# Patient Record
Sex: Female | Born: 1940
Health system: Southern US, Community
[De-identification: ages and names within clinical notes are randomized; demographics above are authoritative.]

## PROBLEM LIST (undated history)

## (undated) DIAGNOSIS — K5792 Diverticulitis of intestine, part unspecified, without perforation or abscess without bleeding: Secondary | ICD-10-CM

## (undated) DIAGNOSIS — Z9889 Other specified postprocedural states: Secondary | ICD-10-CM

## (undated) DIAGNOSIS — M4316 Spondylolisthesis, lumbar region: Secondary | ICD-10-CM

## (undated) DIAGNOSIS — E785 Hyperlipidemia, unspecified: Secondary | ICD-10-CM

## (undated) DIAGNOSIS — K219 Gastro-esophageal reflux disease without esophagitis: Secondary | ICD-10-CM

## (undated) DIAGNOSIS — I639 Cerebral infarction, unspecified: Secondary | ICD-10-CM

## (undated) DIAGNOSIS — I480 Paroxysmal atrial fibrillation: Secondary | ICD-10-CM

## (undated) DIAGNOSIS — K589 Irritable bowel syndrome without diarrhea: Secondary | ICD-10-CM

## (undated) DIAGNOSIS — M719 Bursopathy, unspecified: Secondary | ICD-10-CM

## (undated) DIAGNOSIS — M47816 Spondylosis without myelopathy or radiculopathy, lumbar region: Secondary | ICD-10-CM

## (undated) DIAGNOSIS — I10 Essential (primary) hypertension: Secondary | ICD-10-CM

## (undated) HISTORY — PX: SHOULDER SURGERY: SHX246

## (undated) HISTORY — DX: Other specified postprocedural states: Z98.890

## (undated) HISTORY — PX: HERNIA REPAIR: SHX51

## (undated) HISTORY — PX: APPENDECTOMY: SHX54

## (undated) HISTORY — PX: BREAST CYST ASPIRATION: SHX578

## (undated) HISTORY — DX: Hyperlipidemia, unspecified: E78.5

## (undated) HISTORY — DX: Cerebral infarction, unspecified: I63.9

## (undated) HISTORY — PX: ABDOMINAL HYSTERECTOMY: SHX81

## (undated) HISTORY — DX: Bursopathy, unspecified: M71.9

## (undated) HISTORY — PX: CHOLECYSTECTOMY: SHX55

## (undated) HISTORY — PX: LUMBAR SPINE SURGERY: SHX701

## (undated) HISTORY — DX: Essential (primary) hypertension: I10

## (undated) HISTORY — DX: Gastro-esophageal reflux disease without esophagitis: K21.9

## (undated) HISTORY — DX: Irritable bowel syndrome without diarrhea: K58.9

## (undated) HISTORY — DX: Paroxysmal atrial fibrillation: I48.0

## (undated) HISTORY — DX: Diverticulitis of intestine, part unspecified, without perforation or abscess without bleeding: K57.92

## (undated) HISTORY — PX: BILATERAL SALPINGOOPHORECTOMY: SHX1223

---

## 1997-12-19 ENCOUNTER — Inpatient Hospital Stay (HOSPITAL_COMMUNITY): Admission: EM | Admit: 1997-12-19 | Discharge: 1997-12-23 | Payer: Self-pay | Admitting: Emergency Medicine

## 1998-10-29 ENCOUNTER — Ambulatory Visit (HOSPITAL_COMMUNITY): Admission: RE | Admit: 1998-10-29 | Discharge: 1998-10-29 | Payer: Self-pay | Admitting: Orthopaedic Surgery

## 1998-11-12 ENCOUNTER — Ambulatory Visit (HOSPITAL_COMMUNITY): Admission: RE | Admit: 1998-11-12 | Discharge: 1998-11-12 | Payer: Self-pay | Admitting: Orthopaedic Surgery

## 1998-11-27 ENCOUNTER — Ambulatory Visit (HOSPITAL_COMMUNITY): Admission: RE | Admit: 1998-11-27 | Discharge: 1998-11-27 | Payer: Self-pay | Admitting: Orthopaedic Surgery

## 2000-02-10 ENCOUNTER — Ambulatory Visit (HOSPITAL_COMMUNITY): Admission: RE | Admit: 2000-02-10 | Discharge: 2000-02-10 | Payer: Self-pay | Admitting: Orthopaedic Surgery

## 2000-02-12 ENCOUNTER — Ambulatory Visit (HOSPITAL_COMMUNITY): Admission: RE | Admit: 2000-02-12 | Discharge: 2000-02-12 | Payer: Self-pay | Admitting: Orthopaedic Surgery

## 2000-02-28 ENCOUNTER — Inpatient Hospital Stay (HOSPITAL_COMMUNITY): Admission: RE | Admit: 2000-02-28 | Discharge: 2000-02-29 | Payer: Self-pay | Admitting: Orthopaedic Surgery

## 2000-04-14 ENCOUNTER — Ambulatory Visit (HOSPITAL_COMMUNITY): Admission: RE | Admit: 2000-04-14 | Discharge: 2000-04-14 | Payer: Self-pay | Admitting: Orthopaedic Surgery

## 2000-06-12 ENCOUNTER — Encounter (HOSPITAL_COMMUNITY): Admission: RE | Admit: 2000-06-12 | Discharge: 2000-07-12 | Payer: Self-pay | Admitting: Orthopaedic Surgery

## 2003-10-21 ENCOUNTER — Ambulatory Visit (HOSPITAL_COMMUNITY): Admission: RE | Admit: 2003-10-21 | Discharge: 2003-10-21 | Payer: Self-pay | Admitting: Family Medicine

## 2004-02-04 ENCOUNTER — Ambulatory Visit (HOSPITAL_COMMUNITY): Admission: RE | Admit: 2004-02-04 | Discharge: 2004-02-04 | Payer: Self-pay | Admitting: Family Medicine

## 2005-08-15 ENCOUNTER — Ambulatory Visit: Payer: Self-pay | Admitting: Internal Medicine

## 2005-08-20 ENCOUNTER — Ambulatory Visit: Payer: Self-pay | Admitting: Internal Medicine

## 2008-04-18 ENCOUNTER — Encounter: Payer: Self-pay | Admitting: Cardiology

## 2008-04-19 ENCOUNTER — Encounter: Payer: Self-pay | Admitting: Cardiology

## 2008-05-01 ENCOUNTER — Ambulatory Visit: Payer: Self-pay | Admitting: Cardiology

## 2008-05-01 ENCOUNTER — Encounter: Payer: Self-pay | Admitting: Cardiology

## 2008-09-11 ENCOUNTER — Encounter: Payer: Self-pay | Admitting: Cardiology

## 2008-09-24 ENCOUNTER — Encounter: Payer: Self-pay | Admitting: Cardiology

## 2008-09-25 ENCOUNTER — Ambulatory Visit: Payer: Self-pay | Admitting: Cardiology

## 2008-09-26 ENCOUNTER — Ambulatory Visit: Payer: Self-pay | Admitting: Cardiology

## 2008-09-26 DIAGNOSIS — M47816 Spondylosis without myelopathy or radiculopathy, lumbar region: Secondary | ICD-10-CM

## 2008-09-26 DIAGNOSIS — E785 Hyperlipidemia, unspecified: Secondary | ICD-10-CM

## 2008-09-26 DIAGNOSIS — I10 Essential (primary) hypertension: Secondary | ICD-10-CM

## 2010-01-24 ENCOUNTER — Encounter: Payer: Self-pay | Admitting: Neurology

## 2010-04-27 ENCOUNTER — Ambulatory Visit (INDEPENDENT_AMBULATORY_CARE_PROVIDER_SITE_OTHER): Payer: PRIVATE HEALTH INSURANCE | Admitting: Internal Medicine

## 2010-04-27 DIAGNOSIS — K5730 Diverticulosis of large intestine without perforation or abscess without bleeding: Secondary | ICD-10-CM

## 2010-04-27 DIAGNOSIS — R109 Unspecified abdominal pain: Secondary | ICD-10-CM

## 2010-05-14 NOTE — Consult Note (Signed)
NAMEVERNADINE, Roberts             ACCOUNT NO.:  [id number]  MEDICAL RECORD NO.:  [id number]          PATIENT TYPE:  LOCATION:                                 FACILITY:  PHYSICIAN:  Lionel December, M.D.    DATE OF BIRTH:  July 19, 1940  DATE OF CONSULTATION: DATE OF DISCHARGE:                                CONSULTATION   REASON FOR CONSULTATION:  Abdominal pain/colonoscopy.  HISTORY OF PRESENT ILLNESS:  Tamara Roberts is a 70 year old female referred to our office for right upper quadrant pain and right and left lower abdominal tenderness.  She does have a history of diverticulitis.  She reports that she recently underwent a renal ultrasound and it was normal.  She also underwent a cystoscope in [place], about 1-1/2 weeks ago and it was normal.  She does say she has a history of diverticulitis. She underwent a CT of the abdomen and pelvis with contrast on March 10, 2010, which revealed extensive diverticulosis of the sigmoid portion of the colon.  There is a single tiny area of haziness around the descending colon at the left anterior superior iliac crest which they suspected scarring from previous diverticulosis rather than from acute inflammation.  The renal ultrasound was done on April 07, 2010, and it was normal.  Her last colonoscopy was on March 08, 2004, by Dr. Cleotis Nipper, which revealed the external anus no abnormalities.  The digital exam of the rectum revealed no rectal masses, no blood on the glove.  There were no polyps or other neoplasm seen.  No inflammation seen.  The patient did have diverticulosis of her mid and distal sigmoid colon with some mild inflammatory, grossly in the distal sigmoid colon. The patient was noted to have internal hemorrhoids.  She says her last flare of diverticulitis was in March.  She was treated with Levaquin at that time.  She says she has had 3-4 bouts of diverticulitis in the past.  She does state that sometimes her abdomen will swell.  When  she does have this abdominal pain on the right and left lower, it is severe. She usually has a bowel movement one every other day.  Her stools are brown in color.  She denies any recent fever.  She is allergic to CODEINE.  SURGERIES:  She had back surgery years ago.  She had a rotator cuff repair, right inguinal hernia repair.  She had a hysterectomy in 1985, with a right oophorectomy with an appendectomy done at that time.  She has had a cholecystectomy.  MEDICAL HISTORY:  Hypertension, high cholesterol, allergies, diverticulitis and diverticulosis.  FAMILY HISTORY:  Her mother is deceased from stomach cancer.  Father is deceased from coronary artery disease.  One sister in good health, two brothers, one is deceased from alcoholism, one in good health.  SOCIAL HISTORY:  She is married.  She works at [employer] as a Diplomatic Services operational officer.  She does not smoke, drink, or do drugs.  She has three children in good health, one daughter at age 39, has CHF developing after childbirth.  HOME MEDICATIONS: 1. Ziac 2.5 mg a day. 2. Lipitor 20 mg a day. 3. Protonix  40 mg b.i.d.4. Hyoscyamine 0.125 mg 1-2 a day as needed. 5. Antivert 12.5 as needed. 6. Zofran 4 mg as needed. 7. Zithromax one a day.  OBJECTIVE:  VITAL SIGNS:  Her weight is 179.2, height 5 feet, temp 96.9, blood pressure 108/68, pulse 72. HEENT:  She has natural teeth.  Her oral mucosa is moist.  Conjunctivae is pink.  Sclerae anicteric.  Thyroid is normal.  There is no cervical lymphadenopathy. LUNGS:  Clear. HEART:  Regular rate and rhythm. ABDOMEN:  Soft.  Bowel sounds are positive.  She does have slight tenderness to the right and left lower quadrants, but very slight on deep palpation.  LABORATORY DATA:  Glucose 84, BUN 19, creatinine 1.16, calcium 9.1, phosphorus 4.3, uric acid 5.6, total protein 6.8, albumin 3.9, AST 22, total bilirubin 0.6, ALT 19, GGT 17, triglycerides normal at 154. Sodium 140, potassium 4.0,  chloride 105, iron 78.  TSH is normal at 4.62, hemoglobin is 12, hematocrit 36.8, MCV 91, WBC count 5.1.  ASSESSMENT:  [name] is a 70 year old female presenting with complaints of left and right lower quadrant pain tenderness on deep palpation.  I doubt this is a diverticular flare at this time.  She does however have history of diverticulitis.  Her last colonoscopy was in 2006, by Dr. Cleotis Nipper.  ASSESSMENT:  At present, she does have this abdominal pain, questionable etiology.  No inflammatory process or colonic neoplasm probably needs to be ruled out.  RECOMMENDATIONS:  We will schedule a colonoscopy with Dr. Karilyn Cota in the near future.  I advised the patient if she thinks she is having a flare of diverticulitis, will go to the emergency room, if the pain is severe and please let us know if she is having a flare as we approach her due date for the colonoscopy.  Thank you for allowing [place] to participate in her care.    ______________________________ Dorene Ar, NP   ______________________________ Lionel December, M.D.    TS/MEDQ  D:  04/27/2010  T:  04/28/2010  Job:  [id number]  cc:   Dr. Sherryll Burger  Electronically Signed by Dorene Ar PA on 04/30/2010 10:48:04 AM Electronically Signed by Lionel December M.D. on 05/14/2010 12:13:57 PM

## 2010-05-21 NOTE — Op Note (Signed)
[place]  Patient:    Tamara Roberts, Tamara Roberts                      MRN: [id number] Proc. Date: 02/28/00 Adm. Date:  [id number] Attending:  Jacki Cones                           Operative Report  PREOPERATIVE DIAGNOSIS:  L2-3 herniated nucleus pulposus left, with free fragment and radiculopathy.  POSTOPERATIVE DIAGNOSIS:  L2-3 herniated nucleus pulposus left, with free fragment and radiculopathy.  PROCEDURE:  Left L2 laminotomy, L2-3 microdiskectomy, and removal of free fragment.  SURGEON:  Mark C. Ophelia Charter, M.D.  ANESTHESIA:  GOT.  ESTIMATED BLOOD LOSS:  Minimal.  DESCRIPTION OF PROCEDURE:  After induction of general anesthesia, the patient was placed on the Andrews frame with careful padding and positioning.  The back was prepped with Duraprep and the area was covered with towels, Betadine Vi-Drape applied, and laminectomy sheets and drapes applied.  Needle localization and crosstable lateral x-ray taken, confirming that the needle was at L2-3 disk space.  A small incision was made just left of the midline, subcutaneous tissue was sharply dissected with Bovie electrocautery. Subperiosteal dissection on the lamina and placement of Taylor retractor laterally with five-pound weight.  Next, the edges of the lamina were outlined using a cup curette.  Smallest Kerrison was used to perform a laminotomy.  There was ligament hypertrophy and facet overgrowth, and the lateral gutter was cleaned of soft tissue and facet spurring overhang out to the level of the pedicle.  Dura was displaced dorsally, and carefully the dura using a Penfield #4 was gently dissected until the floor was encountered.  There was a sac of tissue, and immediately portions of free fragment were found and suctioned up.  Nerve hook was used to gently deliver multiple pieces of the free fragment into the microscopic field and suctioned up or grasped them with the micropituitaries.   Disk herniation was in a linear split and was just left of the midline, and fragments had herniated both superiorly, which was cephalad and also primarily caudally. Caudal pieces were suctioned up, gently delivered with the ball-tip nerve hook to prevent pushing the fragments farther up or down the canal.  Epstein curette was used to help deliver some of them.  The sac where the fragments had slid down the canal was a thin, fibrous capsule which surrounded the fragments.  This was opened up, and remaining tiny pieces were removed.  By then, a hockey stick could be easily moved 180 degree sweep anterior to the dura and up the foramina with no areas of compression.  A stab X was made with the 15 blade using DErrico, gently retracting the dura, and numerous passes were made with the straight pituitary, removing a small amount of degenerative disk material.  Anterior portion of the disk was intact, and micropituitary was used as well and Epstein curette at the midline to remove one small piece, which was subligamentous in the midline.  Final sweeps were made for any remaining pieces of the free fragment, and none were left.  Nerve was well-decompressed.  Palpation on all sides of the nerve root was performed with no areas of compression and after irrigation with saline solution in the disk and operative field, fascia was closed with 0 Vicryl, 2-0 Vicryl in the subcutaneous tissue, and Marcaine infiltration of the skin and skin staple closure.  Instrument count and needle count were correct. DD:  02/28/00 TD:  02/29/00 Job: [id number]

## 2010-06-03 ENCOUNTER — Encounter (HOSPITAL_BASED_OUTPATIENT_CLINIC_OR_DEPARTMENT_OTHER): Payer: PRIVATE HEALTH INSURANCE | Admitting: Internal Medicine

## 2010-06-03 ENCOUNTER — Ambulatory Visit (HOSPITAL_COMMUNITY)
Admission: RE | Admit: 2010-06-03 | Discharge: 2010-06-03 | Disposition: A | Discharge status: Home / Self Care | Payer: PRIVATE HEALTH INSURANCE | Source: Ambulatory Visit | Attending: Internal Medicine | Admitting: Internal Medicine

## 2010-06-03 DIAGNOSIS — K5730 Diverticulosis of large intestine without perforation or abscess without bleeding: Secondary | ICD-10-CM | POA: Insufficient documentation

## 2010-06-03 DIAGNOSIS — R109 Unspecified abdominal pain: Secondary | ICD-10-CM

## 2010-06-03 DIAGNOSIS — R198 Other specified symptoms and signs involving the digestive system and abdomen: Secondary | ICD-10-CM

## 2010-06-03 DIAGNOSIS — I10 Essential (primary) hypertension: Secondary | ICD-10-CM | POA: Insufficient documentation

## 2010-06-03 DIAGNOSIS — K644 Residual hemorrhoidal skin tags: Secondary | ICD-10-CM | POA: Insufficient documentation

## 2010-06-03 DIAGNOSIS — E785 Hyperlipidemia, unspecified: Secondary | ICD-10-CM | POA: Insufficient documentation

## 2010-06-03 DIAGNOSIS — K5732 Diverticulitis of large intestine without perforation or abscess without bleeding: Secondary | ICD-10-CM

## 2010-06-22 NOTE — Op Note (Signed)
  NAMEGEORGEANNE, Tamara Roberts             ACCOUNT NO.:  [id number]  MEDICAL RECORD NO.:  [id number]           PATIENT TYPE:  O  LOCATION:  [facility]                          FACILITY:  [facility]  PHYSICIAN:  Lionel December, M.D.    DATE OF BIRTH:  [birth year]  DATE OF PROCEDURE:  06/03/2010 DATE OF DISCHARGE:                              OPERATIVE REPORT   PROCEDURE:  Colonoscopy.  INDICATION:  [name] is a 70 year old Caucasian female who was treated for diverticulitis in March of this year and she still has intermittent pain either on the left or the right side.  She also gives history of irregular bowel movements with diarrhea and/or constipation.  Her last colonoscopy was over 7 years ago.  She is undergoing diagnostic colonoscopy.  Procedure risks were reviewed with the patient.  Informed consent was obtained.  MEDICATIONS FOR CONSCIOUS SEDATION:  Demerol 50 mg IV and Versed 5 mg IV.  FINDINGS:  Procedure performed in endoscopy suite.  The patient's vital signs and O2 saturations were monitored during the procedure and remained stable.  The patient was placed in left lateral recumbent position and rectal examination was performed.  No abnormality was noted on external or digital exam.  Pentax videoscope was placed through rectum and advanced under vision into sigmoid colon and beyond. Preparation was excellent.  She had moderate number of diverticula at sigmoid colon.  None of these had any peristomal edema or erythema. Scope was advanced into cecum which was identified by appendiceal stump and ileocecal valve.  Pictures were taken for the record.  As the scope was withdrawn, colonic mucosa was carefully examined and no polyps or tumor masses were noted.  Rectal mucosa was normal.  Scope was retroflexed to examine anorectal junction and small hemorrhoids were noted below the dentate line along with a thickened anoderm.  Endoscope was withdrawn.  Withdrawal time was 11 minutes.  The patient  tolerated the procedure well.  FINAL DIAGNOSES: 1. Sigmoid diverticulosis (moderate number). 2. Small external hemorrhoids. 3. No evidence of polyps, colitis, or tumor. 4. Suspect her ongoing symptoms may be due to irritable bowel     syndrome.  RECOMMENDATIONS: 1. High-fiber diet plus fiber supplement 3-4 g daily. 2. Levsin SL t.i.d. p.r.n., prescription given for 60 with 3 refills.     If her symptoms persist, she will return for office visit.  She     will call for office visit.          ______________________________ Lionel December, M.D.     NR/MEDQ  D:  06/03/2010  T:  06/04/2010  Job:  [id number]  cc:   Dr. Sherryll Burger  Electronically Signed by Lionel December M.D. on 06/22/2010 10:11:16 AM

## 2011-07-15 ENCOUNTER — Encounter: Payer: Self-pay | Admitting: *Deleted

## 2012-08-13 ENCOUNTER — Ambulatory Visit: Payer: Self-pay | Admitting: Adult Health

## 2014-03-05 ENCOUNTER — Other Ambulatory Visit: Payer: Self-pay

## 2014-03-05 DIAGNOSIS — Z1231 Encounter for screening mammogram for malignant neoplasm of breast: Secondary | ICD-10-CM

## 2014-03-11 ENCOUNTER — Encounter (INDEPENDENT_AMBULATORY_CARE_PROVIDER_SITE_OTHER): Payer: Self-pay

## 2014-03-11 ENCOUNTER — Ambulatory Visit
Admission: RE | Admit: 2014-03-11 | Discharge: 2014-03-11 | Disposition: A | Discharge status: Home / Self Care | Payer: Medicare Other | Source: Ambulatory Visit

## 2014-03-11 DIAGNOSIS — Z1231 Encounter for screening mammogram for malignant neoplasm of breast: Secondary | ICD-10-CM

## 2014-10-20 ENCOUNTER — Other Ambulatory Visit (HOSPITAL_COMMUNITY): Payer: Self-pay | Admitting: Nephrology

## 2014-10-20 DIAGNOSIS — N289 Disorder of kidney and ureter, unspecified: Secondary | ICD-10-CM

## 2014-11-05 ENCOUNTER — Ambulatory Visit (HOSPITAL_COMMUNITY)
Admission: RE | Admit: 2014-11-05 | Discharge: 2014-11-05 | Disposition: A | Discharge status: Home / Self Care | Payer: Medicare Other | Source: Ambulatory Visit | Attending: Nephrology | Admitting: Nephrology

## 2014-11-05 DIAGNOSIS — N289 Disorder of kidney and ureter, unspecified: Secondary | ICD-10-CM | POA: Diagnosis not present

## 2014-11-12 ENCOUNTER — Other Ambulatory Visit (HOSPITAL_COMMUNITY): Payer: Medicare Other

## 2015-01-21 DIAGNOSIS — M1990 Unspecified osteoarthritis, unspecified site: Secondary | ICD-10-CM | POA: Diagnosis not present

## 2015-01-21 DIAGNOSIS — Z789 Other specified health status: Secondary | ICD-10-CM | POA: Diagnosis not present

## 2015-01-21 DIAGNOSIS — Z6832 Body mass index (BMI) 32.0-32.9, adult: Secondary | ICD-10-CM | POA: Diagnosis not present

## 2015-01-21 DIAGNOSIS — E7800 Pure hypercholesterolemia, unspecified: Secondary | ICD-10-CM | POA: Diagnosis not present

## 2015-01-22 DIAGNOSIS — G4700 Insomnia, unspecified: Secondary | ICD-10-CM | POA: Diagnosis not present

## 2015-01-22 DIAGNOSIS — M25512 Pain in left shoulder: Secondary | ICD-10-CM | POA: Diagnosis not present

## 2015-01-22 DIAGNOSIS — Z418 Encounter for other procedures for purposes other than remedying health state: Secondary | ICD-10-CM | POA: Diagnosis not present

## 2015-02-26 DIAGNOSIS — J0100 Acute maxillary sinusitis, unspecified: Secondary | ICD-10-CM | POA: Diagnosis not present

## 2015-03-02 DIAGNOSIS — J111 Influenza due to unidentified influenza virus with other respiratory manifestations: Secondary | ICD-10-CM | POA: Diagnosis not present

## 2015-04-27 DIAGNOSIS — M159 Polyosteoarthritis, unspecified: Secondary | ICD-10-CM | POA: Diagnosis not present

## 2015-04-27 DIAGNOSIS — I639 Cerebral infarction, unspecified: Secondary | ICD-10-CM | POA: Diagnosis not present

## 2015-04-27 DIAGNOSIS — I10 Essential (primary) hypertension: Secondary | ICD-10-CM | POA: Diagnosis not present

## 2015-04-27 DIAGNOSIS — E7800 Pure hypercholesterolemia, unspecified: Secondary | ICD-10-CM | POA: Diagnosis not present

## 2015-06-22 DIAGNOSIS — J069 Acute upper respiratory infection, unspecified: Secondary | ICD-10-CM | POA: Diagnosis not present

## 2015-06-22 DIAGNOSIS — I10 Essential (primary) hypertension: Secondary | ICD-10-CM | POA: Diagnosis not present

## 2015-06-22 DIAGNOSIS — I639 Cerebral infarction, unspecified: Secondary | ICD-10-CM | POA: Diagnosis not present

## 2015-06-29 DIAGNOSIS — I639 Cerebral infarction, unspecified: Secondary | ICD-10-CM | POA: Diagnosis not present

## 2015-06-29 DIAGNOSIS — I10 Essential (primary) hypertension: Secondary | ICD-10-CM | POA: Diagnosis not present

## 2015-06-29 DIAGNOSIS — E7800 Pure hypercholesterolemia, unspecified: Secondary | ICD-10-CM | POA: Diagnosis not present

## 2015-06-29 DIAGNOSIS — M159 Polyosteoarthritis, unspecified: Secondary | ICD-10-CM | POA: Diagnosis not present

## 2015-07-01 DIAGNOSIS — M5416 Radiculopathy, lumbar region: Secondary | ICD-10-CM | POA: Diagnosis not present

## 2015-07-23 DIAGNOSIS — I639 Cerebral infarction, unspecified: Secondary | ICD-10-CM | POA: Diagnosis not present

## 2015-07-23 DIAGNOSIS — M159 Polyosteoarthritis, unspecified: Secondary | ICD-10-CM | POA: Diagnosis not present

## 2015-07-23 DIAGNOSIS — E7800 Pure hypercholesterolemia, unspecified: Secondary | ICD-10-CM | POA: Diagnosis not present

## 2015-07-23 DIAGNOSIS — I10 Essential (primary) hypertension: Secondary | ICD-10-CM | POA: Diagnosis not present

## 2015-07-27 DIAGNOSIS — Z299 Encounter for prophylactic measures, unspecified: Secondary | ICD-10-CM | POA: Diagnosis not present

## 2015-07-27 DIAGNOSIS — Z7189 Other specified counseling: Secondary | ICD-10-CM | POA: Diagnosis not present

## 2015-07-27 DIAGNOSIS — Z1211 Encounter for screening for malignant neoplasm of colon: Secondary | ICD-10-CM | POA: Diagnosis not present

## 2015-07-27 DIAGNOSIS — Z0000 Encounter for general adult medical examination without abnormal findings: Secondary | ICD-10-CM | POA: Diagnosis not present

## 2015-07-27 DIAGNOSIS — R5383 Other fatigue: Secondary | ICD-10-CM | POA: Diagnosis not present

## 2015-07-27 DIAGNOSIS — Z79899 Other long term (current) drug therapy: Secondary | ICD-10-CM | POA: Diagnosis not present

## 2015-07-27 DIAGNOSIS — Z6830 Body mass index (BMI) 30.0-30.9, adult: Secondary | ICD-10-CM | POA: Diagnosis not present

## 2015-07-27 DIAGNOSIS — Z1389 Encounter for screening for other disorder: Secondary | ICD-10-CM | POA: Diagnosis not present

## 2015-07-27 DIAGNOSIS — E7800 Pure hypercholesterolemia, unspecified: Secondary | ICD-10-CM | POA: Diagnosis not present

## 2015-08-14 DIAGNOSIS — Z299 Encounter for prophylactic measures, unspecified: Secondary | ICD-10-CM | POA: Diagnosis not present

## 2015-08-14 DIAGNOSIS — B029 Zoster without complications: Secondary | ICD-10-CM | POA: Diagnosis not present

## 2015-09-10 DIAGNOSIS — I639 Cerebral infarction, unspecified: Secondary | ICD-10-CM | POA: Diagnosis not present

## 2015-09-10 DIAGNOSIS — M25512 Pain in left shoulder: Secondary | ICD-10-CM | POA: Diagnosis not present

## 2015-09-10 DIAGNOSIS — F329 Major depressive disorder, single episode, unspecified: Secondary | ICD-10-CM | POA: Diagnosis not present

## 2015-09-10 DIAGNOSIS — E7800 Pure hypercholesterolemia, unspecified: Secondary | ICD-10-CM | POA: Diagnosis not present

## 2015-09-16 ENCOUNTER — Other Ambulatory Visit: Payer: Self-pay | Admitting: Internal Medicine

## 2015-09-16 DIAGNOSIS — Z1231 Encounter for screening mammogram for malignant neoplasm of breast: Secondary | ICD-10-CM

## 2015-09-30 ENCOUNTER — Ambulatory Visit
Admission: RE | Admit: 2015-09-30 | Discharge: 2015-09-30 | Disposition: A | Discharge status: Home / Self Care | Payer: PPO | Source: Ambulatory Visit | Attending: Internal Medicine | Admitting: Internal Medicine

## 2015-09-30 ENCOUNTER — Encounter: Payer: Self-pay | Admitting: Radiology

## 2015-09-30 DIAGNOSIS — Z1231 Encounter for screening mammogram for malignant neoplasm of breast: Secondary | ICD-10-CM

## 2015-10-22 DIAGNOSIS — R51 Headache: Secondary | ICD-10-CM | POA: Diagnosis not present

## 2015-10-22 DIAGNOSIS — Z713 Dietary counseling and surveillance: Secondary | ICD-10-CM | POA: Diagnosis not present

## 2015-10-22 DIAGNOSIS — Z299 Encounter for prophylactic measures, unspecified: Secondary | ICD-10-CM | POA: Diagnosis not present

## 2015-10-22 DIAGNOSIS — G4700 Insomnia, unspecified: Secondary | ICD-10-CM | POA: Diagnosis not present

## 2015-10-22 DIAGNOSIS — Z6831 Body mass index (BMI) 31.0-31.9, adult: Secondary | ICD-10-CM | POA: Diagnosis not present

## 2015-11-10 DIAGNOSIS — M5416 Radiculopathy, lumbar region: Secondary | ICD-10-CM | POA: Diagnosis not present

## 2016-01-01 ENCOUNTER — Encounter: Payer: Self-pay | Admitting: Adult Health

## 2016-01-01 ENCOUNTER — Ambulatory Visit (INDEPENDENT_AMBULATORY_CARE_PROVIDER_SITE_OTHER): Payer: PPO | Admitting: Adult Health

## 2016-01-01 VITALS — BP 130/80 | HR 88 | Ht 61.0 in | Wt 167.5 lb

## 2016-01-01 DIAGNOSIS — I10 Essential (primary) hypertension: Secondary | ICD-10-CM | POA: Diagnosis not present

## 2016-01-01 DIAGNOSIS — I639 Cerebral infarction, unspecified: Secondary | ICD-10-CM | POA: Diagnosis not present

## 2016-01-01 DIAGNOSIS — E7800 Pure hypercholesterolemia, unspecified: Secondary | ICD-10-CM | POA: Diagnosis not present

## 2016-01-01 DIAGNOSIS — N764 Abscess of vulva: Secondary | ICD-10-CM | POA: Diagnosis not present

## 2016-01-01 DIAGNOSIS — M159 Polyosteoarthritis, unspecified: Secondary | ICD-10-CM | POA: Diagnosis not present

## 2016-01-01 MED ORDER — CIPROFLOXACIN HCL 500 MG PO TABS: 500.0000 mg | ORAL_TABLET | Freq: Two times a day (BID) | ORAL | 0 refills | Status: DC

## 2016-01-01 NOTE — Patient Instructions (Signed)
Use warm compress 3 x day Take cipro Follow up prn

## 2016-01-01 NOTE — Progress Notes (Signed)
Subjective:     Patient ID: Leeanne Mannanarolyn V Baca, female   DOB: 10/17/1940, 75 y.o.   MRN: [id number]  HPI Eber JonesCarolyn is a 75 year old white female, married in complaining knot in vagina area, that is tender, but it has gone down in size.  PCP Dr Sherryll BurgerShah in [place].  Review of Systems Knot in vagina area Reviewed past medical,surgical, social and family history. Reviewed medications and allergies.     Objective:   Physical Exam BP 130/80 (BP Location: Left Arm, Patient Position: Sitting, Cuff Size: Normal)   Pulse 88   Ht 5\' 1"  (1.549 m)   Wt 167 lb 8 oz (76 kg)   BMI 31.65 kg/m PHQ 2 score 0. Skin warm and dry, has 2 cm tender area left labia that has drained, and seems be resolving.Will give cipro to hasten healing and use warm compresses, do not squeeze.     Assessment:     1. Boil of vulva       Plan:     Rx cipro 500 mg 1 bid x 7 days #14  Use warm compresses 3 x daily Follow up prn

## 2016-01-22 DIAGNOSIS — M2550 Pain in unspecified joint: Secondary | ICD-10-CM | POA: Diagnosis not present

## 2016-01-22 DIAGNOSIS — R42 Dizziness and giddiness: Secondary | ICD-10-CM | POA: Diagnosis not present

## 2016-01-22 DIAGNOSIS — M1990 Unspecified osteoarthritis, unspecified site: Secondary | ICD-10-CM | POA: Diagnosis not present

## 2016-01-26 DIAGNOSIS — M24842 Other specific joint derangements of left hand, not elsewhere classified: Secondary | ICD-10-CM | POA: Diagnosis not present

## 2016-01-26 DIAGNOSIS — M79642 Pain in left hand: Secondary | ICD-10-CM | POA: Diagnosis not present

## 2016-01-26 DIAGNOSIS — M19041 Primary osteoarthritis, right hand: Secondary | ICD-10-CM | POA: Diagnosis not present

## 2016-01-26 DIAGNOSIS — M24841 Other specific joint derangements of right hand, not elsewhere classified: Secondary | ICD-10-CM | POA: Diagnosis not present

## 2016-01-26 DIAGNOSIS — M19042 Primary osteoarthritis, left hand: Secondary | ICD-10-CM | POA: Diagnosis not present

## 2016-02-24 DIAGNOSIS — M545 Low back pain: Secondary | ICD-10-CM | POA: Diagnosis not present

## 2016-02-24 DIAGNOSIS — Z299 Encounter for prophylactic measures, unspecified: Secondary | ICD-10-CM | POA: Diagnosis not present

## 2016-02-24 DIAGNOSIS — I639 Cerebral infarction, unspecified: Secondary | ICD-10-CM | POA: Diagnosis not present

## 2016-02-24 DIAGNOSIS — F329 Major depressive disorder, single episode, unspecified: Secondary | ICD-10-CM | POA: Diagnosis not present

## 2016-02-24 DIAGNOSIS — E7800 Pure hypercholesterolemia, unspecified: Secondary | ICD-10-CM | POA: Diagnosis not present

## 2016-02-24 DIAGNOSIS — Z6830 Body mass index (BMI) 30.0-30.9, adult: Secondary | ICD-10-CM | POA: Diagnosis not present

## 2016-02-24 DIAGNOSIS — Z789 Other specified health status: Secondary | ICD-10-CM | POA: Diagnosis not present

## 2016-02-24 DIAGNOSIS — I10 Essential (primary) hypertension: Secondary | ICD-10-CM | POA: Diagnosis not present

## 2016-02-24 DIAGNOSIS — Z713 Dietary counseling and surveillance: Secondary | ICD-10-CM | POA: Diagnosis not present

## 2016-03-10 DIAGNOSIS — Z713 Dietary counseling and surveillance: Secondary | ICD-10-CM | POA: Diagnosis not present

## 2016-03-10 DIAGNOSIS — Z299 Encounter for prophylactic measures, unspecified: Secondary | ICD-10-CM | POA: Diagnosis not present

## 2016-03-10 DIAGNOSIS — M545 Low back pain: Secondary | ICD-10-CM | POA: Diagnosis not present

## 2016-03-10 DIAGNOSIS — Z6830 Body mass index (BMI) 30.0-30.9, adult: Secondary | ICD-10-CM | POA: Diagnosis not present

## 2016-03-10 DIAGNOSIS — I10 Essential (primary) hypertension: Secondary | ICD-10-CM | POA: Diagnosis not present

## 2016-03-10 DIAGNOSIS — Z789 Other specified health status: Secondary | ICD-10-CM | POA: Diagnosis not present

## 2016-03-10 DIAGNOSIS — F329 Major depressive disorder, single episode, unspecified: Secondary | ICD-10-CM | POA: Diagnosis not present

## 2016-03-10 DIAGNOSIS — E7800 Pure hypercholesterolemia, unspecified: Secondary | ICD-10-CM | POA: Diagnosis not present

## 2016-04-01 ENCOUNTER — Emergency Department (HOSPITAL_COMMUNITY): Payer: PPO

## 2016-04-01 ENCOUNTER — Ambulatory Visit (HOSPITAL_COMMUNITY)
Admission: EM | Disposition: A | Discharge status: Home / Self Care | Payer: Self-pay | Source: Home / Self Care | Attending: Emergency Medicine

## 2016-04-01 ENCOUNTER — Observation Stay (HOSPITAL_COMMUNITY)
Admission: EM | Admit: 2016-04-01 | Discharge: 2016-04-02 | Disposition: A | Discharge status: Home / Self Care | Payer: PPO | Attending: Cardiology | Admitting: Cardiology

## 2016-04-01 ENCOUNTER — Inpatient Hospital Stay (HOSPITAL_COMMUNITY): Payer: PPO

## 2016-04-01 ENCOUNTER — Encounter (HOSPITAL_COMMUNITY): Payer: Self-pay

## 2016-04-01 DIAGNOSIS — E785 Hyperlipidemia, unspecified: Secondary | ICD-10-CM | POA: Diagnosis present

## 2016-04-01 DIAGNOSIS — R079 Chest pain, unspecified: Secondary | ICD-10-CM | POA: Diagnosis not present

## 2016-04-01 DIAGNOSIS — Z833 Family history of diabetes mellitus: Secondary | ICD-10-CM | POA: Insufficient documentation

## 2016-04-01 DIAGNOSIS — M4726 Other spondylosis with radiculopathy, lumbar region: Secondary | ICD-10-CM | POA: Insufficient documentation

## 2016-04-01 DIAGNOSIS — I2510 Atherosclerotic heart disease of native coronary artery without angina pectoris: Secondary | ICD-10-CM | POA: Diagnosis not present

## 2016-04-01 DIAGNOSIS — Z7902 Long term (current) use of antithrombotics/antiplatelets: Secondary | ICD-10-CM | POA: Diagnosis not present

## 2016-04-01 DIAGNOSIS — I200 Unstable angina: Secondary | ICD-10-CM | POA: Diagnosis not present

## 2016-04-01 DIAGNOSIS — I700 Atherosclerosis of aorta: Secondary | ICD-10-CM | POA: Diagnosis not present

## 2016-04-01 DIAGNOSIS — R110 Nausea: Secondary | ICD-10-CM | POA: Diagnosis not present

## 2016-04-01 DIAGNOSIS — E876 Hypokalemia: Secondary | ICD-10-CM

## 2016-04-01 DIAGNOSIS — K589 Irritable bowel syndrome without diarrhea: Secondary | ICD-10-CM | POA: Insufficient documentation

## 2016-04-01 DIAGNOSIS — Z7982 Long term (current) use of aspirin: Secondary | ICD-10-CM | POA: Insufficient documentation

## 2016-04-01 DIAGNOSIS — K219 Gastro-esophageal reflux disease without esophagitis: Secondary | ICD-10-CM | POA: Diagnosis not present

## 2016-04-01 DIAGNOSIS — N289 Disorder of kidney and ureter, unspecified: Secondary | ICD-10-CM

## 2016-04-01 DIAGNOSIS — M1990 Unspecified osteoarthritis, unspecified site: Secondary | ICD-10-CM | POA: Insufficient documentation

## 2016-04-01 DIAGNOSIS — Z8249 Family history of ischemic heart disease and other diseases of the circulatory system: Secondary | ICD-10-CM | POA: Insufficient documentation

## 2016-04-01 DIAGNOSIS — Z8673 Personal history of transient ischemic attack (TIA), and cerebral infarction without residual deficits: Secondary | ICD-10-CM | POA: Diagnosis not present

## 2016-04-01 DIAGNOSIS — I10 Essential (primary) hypertension: Secondary | ICD-10-CM | POA: Diagnosis not present

## 2016-04-01 DIAGNOSIS — Z7951 Long term (current) use of inhaled steroids: Secondary | ICD-10-CM | POA: Diagnosis not present

## 2016-04-01 DIAGNOSIS — R0789 Other chest pain: Principal | ICD-10-CM | POA: Insufficient documentation

## 2016-04-01 DIAGNOSIS — M47816 Spondylosis without myelopathy or radiculopathy, lumbar region: Secondary | ICD-10-CM | POA: Diagnosis present

## 2016-04-01 HISTORY — DX: Spondylosis without myelopathy or radiculopathy, lumbar region: M47.816

## 2016-04-01 HISTORY — PX: LEFT HEART CATH AND CORONARY ANGIOGRAPHY: CATH118249

## 2016-04-01 LAB — CBC
HCT: 39.8 % (ref 36.0–46.0)
HCT: 36.4 % (ref 36.0–46.0)
Hemoglobin: 13.3 g/dL (ref 12.0–15.0)
Hemoglobin: 12 g/dL (ref 12.0–15.0)
MCH: 29.7 pg (ref 26.0–34.0)
MCH: 29.3 pg (ref 26.0–34.0)
MCHC: 33.4 g/dL (ref 30.0–36.0)
MCHC: 33 g/dL (ref 30.0–36.0)
MCV: 88.8 fL (ref 78.0–100.0)
MCV: 88.8 fL (ref 78.0–100.0)
PLATELETS: 225 10*3/uL (ref 150–400)
PLATELETS: 206 10*3/uL (ref 150–400)
RBC: 4.48 MIL/uL (ref 3.87–5.11)
RBC: 4.1 MIL/uL (ref 3.87–5.11)
RDW: 13.2 % (ref 11.5–15.5)
RDW: 13.5 % (ref 11.5–15.5)
WBC: 7.1 10*3/uL (ref 4.0–10.5)
WBC: 6.7 10*3/uL (ref 4.0–10.5)

## 2016-04-01 LAB — DIFFERENTIAL
Basophils Absolute: 0 10*3/uL (ref 0.0–0.1)
Basophils Relative: 1 %
EOS PCT: 3 %
Eosinophils Absolute: 0.2 10*3/uL (ref 0.0–0.7)
LYMPHS PCT: 32 %
Lymphs Abs: 2.2 10*3/uL (ref 0.7–4.0)
MONO ABS: 0.8 10*3/uL (ref 0.1–1.0)
MONOS PCT: 11 %
Neutro Abs: 3.8 10*3/uL (ref 1.7–7.7)
Neutrophils Relative %: 53 %

## 2016-04-01 LAB — COMPREHENSIVE METABOLIC PANEL
ALBUMIN: 4.2 g/dL (ref 3.5–5.0)
ALT: 18 U/L (ref 14–54)
AST: 21 U/L (ref 15–41)
Alkaline Phosphatase: 61 U/L (ref 38–126)
Anion gap: 12 (ref 5–15)
BUN: 16 mg/dL (ref 6–20)
CALCIUM: 9.2 mg/dL (ref 8.9–10.3)
CO2: 22 mmol/L (ref 22–32)
Chloride: 105 mmol/L (ref 101–111)
Creatinine, Ser: 1.26 mg/dL — ABNORMAL HIGH (ref 0.44–1.00)
GFR calc non Af Amer: 41 mL/min — ABNORMAL LOW (ref >60)
GFR, EST AFRICAN AMERICAN: 47 mL/min — AB (ref >60)
GLUCOSE: 92 mg/dL (ref 65–99)
Potassium: 3.3 mmol/L — ABNORMAL LOW (ref 3.5–5.1)
Sodium: 139 mmol/L (ref 135–145)
Total Bilirubin: 0.5 mg/dL (ref 0.3–1.2)
Total Protein: 7.4 g/dL (ref 6.5–8.1)

## 2016-04-01 LAB — URINALYSIS, ROUTINE W REFLEX MICROSCOPIC
Bilirubin Urine: NEGATIVE
GLUCOSE, UA: NEGATIVE mg/dL
HGB URINE DIPSTICK: NEGATIVE
KETONES UR: NEGATIVE mg/dL
Nitrite: NEGATIVE
PH: 6 (ref 5.0–8.0)
Protein, ur: NEGATIVE mg/dL
Specific Gravity, Urine: 1.01 (ref 1.005–1.030)

## 2016-04-01 LAB — URINALYSIS, MICROSCOPIC (REFLEX)
Bacteria, UA: NONE SEEN
RBC / HPF: NONE SEEN RBC/hpf (ref 0–5)
SQUAMOUS EPITHELIAL / LPF: NONE SEEN

## 2016-04-01 LAB — ECHOCARDIOGRAM COMPLETE
CHL CUP MV DEC (S): 356
E decel time: 356 msec
EERAT: 19.73
FS: 39 % (ref 28–44)
Height: 60 in
IV/PV OW: 0.98
LA diam end sys: 37 mm
LA vol index: 22.8 mL/m2
LA vol: 39.4 mL
LADIAMINDEX: 2.14 cm/m2
LASIZE: 37 mm
LAVOLA4C: 35.3 mL
LV E/e' medial: 19.73
LV E/e'average: 19.73
LVELAT: 5.22 cm/s
MV pk A vel: 102 m/s
MVPG: 4 mmHg
MVPKEVEL: 103 m/s
PW: 13 mm — AB (ref 0.6–1.1)
RV LATERAL S' VELOCITY: 13.2 cm/s
RV TAPSE: 24.4 mm
TDI e' lateral: 5.22
TDI e' medial: 6.09
Weight: 2688 oz

## 2016-04-01 LAB — I-STAT CHEM 8, ED
BUN: 20 mg/dL (ref 6–20)
CREATININE: 1.2 mg/dL — AB (ref 0.44–1.00)
Calcium, Ion: 1.11 mmol/L — ABNORMAL LOW (ref 1.15–1.40)
Chloride: 105 mmol/L (ref 101–111)
GLUCOSE: 92 mg/dL (ref 65–99)
HCT: 38 % (ref 36.0–46.0)
HEMOGLOBIN: 12.9 g/dL (ref 12.0–15.0)
Potassium: 3.3 mmol/L — ABNORMAL LOW (ref 3.5–5.1)
Sodium: 140 mmol/L (ref 135–145)
TCO2: 24 mmol/L (ref 0–100)

## 2016-04-01 LAB — PROTIME-INR
INR: 0.92
PROTHROMBIN TIME: 12.3 s (ref 11.4–15.2)

## 2016-04-01 LAB — LIPID PANEL
CHOL/HDL RATIO: 4.5 ratio
CHOLESTEROL: 246 mg/dL — AB (ref 0–200)
HDL: 55 mg/dL (ref >40)
LDL Cholesterol: 136 mg/dL — ABNORMAL HIGH (ref 0–99)
TRIGLYCERIDES: 273 mg/dL — AB (ref <150)
VLDL: 55 mg/dL — AB (ref 0–40)

## 2016-04-01 LAB — CREATININE, SERUM
CREATININE: 1.19 mg/dL — AB (ref 0.44–1.00)
GFR calc non Af Amer: 44 mL/min — ABNORMAL LOW (ref >60)
GFR, EST AFRICAN AMERICAN: 51 mL/min — AB (ref >60)

## 2016-04-01 LAB — I-STAT TROPONIN, ED
TROPONIN I, POC: 0 ng/mL (ref 0.00–0.08)
Troponin i, poc: 0 ng/mL (ref 0.00–0.08)
Troponin i, poc: 0 ng/mL (ref 0.00–0.08)

## 2016-04-01 LAB — TROPONIN I: Troponin I: <0.03 ng/mL (ref <0.03)

## 2016-04-01 LAB — APTT: aPTT: 26 seconds (ref 24–36)

## 2016-04-01 SURGERY — LEFT HEART CATH AND CORONARY ANGIOGRAPHY
Anesthesia: LOCAL

## 2016-04-01 MED ORDER — NITROGLYCERIN 0.4 MG SL SUBL
SUBLINGUAL_TABLET | SUBLINGUAL | Status: AC
  Filled 2016-04-01: qty 1

## 2016-04-01 MED ORDER — FENTANYL CITRATE (PF) 100 MCG/2ML IJ SOLN
INTRAMUSCULAR | Status: AC
  Filled 2016-04-01: qty 2

## 2016-04-01 MED ORDER — MIDAZOLAM HCL 2 MG/2ML IJ SOLN
INTRAMUSCULAR | Status: AC
  Filled 2016-04-01: qty 2

## 2016-04-01 MED ORDER — HEPARIN (PORCINE) IN NACL 2-0.9 UNIT/ML-% IJ SOLN
INTRAMUSCULAR | Status: AC
  Filled 2016-04-01: qty 1000

## 2016-04-01 MED ORDER — HEPARIN BOLUS VIA INFUSION
4000.0000 [IU] | Freq: Once | INTRAVENOUS | Status: AC
  Administered 2016-04-01: 4000 [IU] via INTRAVENOUS

## 2016-04-01 MED ORDER — SODIUM CHLORIDE 0.9 % IV SOLN: 250.0000 mL | INTRAVENOUS | Status: DC | PRN

## 2016-04-01 MED ORDER — FENTANYL CITRATE (PF) 100 MCG/2ML IJ SOLN
INTRAMUSCULAR | Status: DC | PRN
  Administered 2016-04-01: 25 ug via INTRAVENOUS

## 2016-04-01 MED ORDER — SODIUM CHLORIDE 0.9 % IV SOLN
10.0000 mL/h | INTRAVENOUS | Status: DC
  Administered 2016-04-01: 20 mL/h via INTRAVENOUS

## 2016-04-01 MED ORDER — ATORVASTATIN CALCIUM 40 MG PO TABS
80.0000 mg | ORAL_TABLET | Freq: Every day | ORAL | Status: DC
  Administered 2016-04-01: 80 mg via ORAL
  Filled 2016-04-01 (×2): qty 1
  Filled 2016-04-01: qty 2
  Filled 2016-04-01: qty 1

## 2016-04-01 MED ORDER — HEPARIN BOLUS VIA INFUSION: 4000.0000 [IU] | Freq: Once | INTRAVENOUS | Status: DC

## 2016-04-01 MED ORDER — SODIUM CHLORIDE 0.9% FLUSH: 3.0000 mL | Freq: Two times a day (BID) | INTRAVENOUS | Status: DC

## 2016-04-01 MED ORDER — SODIUM CHLORIDE 0.9 % WEIGHT BASED INFUSION
1.0000 mL/kg/h | INTRAVENOUS | Status: AC
  Administered 2016-04-01: 1 mL/kg/h via INTRAVENOUS

## 2016-04-01 MED ORDER — ONDANSETRON HCL 4 MG/2ML IJ SOLN: 4.0000 mg | Freq: Four times a day (QID) | INTRAMUSCULAR | Status: DC | PRN

## 2016-04-01 MED ORDER — MORPHINE SULFATE (PF) 2 MG/ML IV SOLN
2.0000 mg | Freq: Once | INTRAVENOUS | Status: AC
  Administered 2016-04-01: 2 mg via INTRAVENOUS
  Filled 2016-04-01: qty 1

## 2016-04-01 MED ORDER — HEPARIN SODIUM (PORCINE) 5000 UNIT/ML IJ SOLN
5000.0000 [IU] | Freq: Three times a day (TID) | INTRAMUSCULAR | Status: DC
  Administered 2016-04-01 – 2016-04-02 (×2): 5000 [IU] via SUBCUTANEOUS
  Filled 2016-04-01 (×2): qty 1

## 2016-04-01 MED ORDER — ACETAMINOPHEN 325 MG PO TABS
650.0000 mg | ORAL_TABLET | ORAL | Status: DC | PRN
  Administered 2016-04-01: 650 mg via ORAL
  Filled 2016-04-01: qty 2

## 2016-04-01 MED ORDER — VERAPAMIL HCL 2.5 MG/ML IV SOLN
INTRAVENOUS | Status: AC
  Filled 2016-04-01: qty 2

## 2016-04-01 MED ORDER — SODIUM CHLORIDE 0.9% FLUSH: 3.0000 mL | INTRAVENOUS | Status: DC | PRN

## 2016-04-01 MED ORDER — NITROGLYCERIN IN D5W 200-5 MCG/ML-% IV SOLN
5.0000 ug/min | Freq: Once | INTRAVENOUS | Status: AC
  Administered 2016-04-01: 5 ug/min via INTRAVENOUS
  Filled 2016-04-01: qty 250

## 2016-04-01 MED ORDER — LIDOCAINE HCL (PF) 1 % IJ SOLN
INTRAMUSCULAR | Status: DC | PRN
  Administered 2016-04-01: 2 mL via INTRADERMAL

## 2016-04-01 MED ORDER — HEPARIN SODIUM (PORCINE) 5000 UNIT/ML IJ SOLN
INTRAMUSCULAR | Status: AC
  Filled 2016-04-01: qty 1

## 2016-04-01 MED ORDER — POTASSIUM CHLORIDE CRYS ER 20 MEQ PO TBCR
40.0000 meq | EXTENDED_RELEASE_TABLET | Freq: Once | ORAL | Status: AC
  Administered 2016-04-01: 40 meq via ORAL

## 2016-04-01 MED ORDER — HEPARIN (PORCINE) IN NACL 100-0.45 UNIT/ML-% IJ SOLN
1000.0000 [IU]/h | INTRAMUSCULAR | Status: DC
  Administered 2016-04-01: 750 [IU]/h via INTRAVENOUS
  Filled 2016-04-01: qty 250

## 2016-04-01 MED ORDER — SODIUM CHLORIDE 0.9 % IV SOLN: INTRAVENOUS | Status: DC

## 2016-04-01 MED ORDER — POTASSIUM CHLORIDE CRYS ER 20 MEQ PO TBCR
EXTENDED_RELEASE_TABLET | ORAL | Status: AC
  Filled 2016-04-01: qty 2

## 2016-04-01 MED ORDER — LIDOCAINE HCL (PF) 1 % IJ SOLN
INTRAMUSCULAR | Status: AC
  Filled 2016-04-01: qty 30

## 2016-04-01 MED ORDER — ASPIRIN 81 MG PO CHEW
324.0000 mg | CHEWABLE_TABLET | Freq: Once | ORAL | Status: AC
  Administered 2016-04-01: 324 mg via ORAL
  Filled 2016-04-01: qty 4

## 2016-04-01 MED ORDER — NITROGLYCERIN 0.4 MG SL SUBL
0.4000 mg | SUBLINGUAL_TABLET | SUBLINGUAL | Status: DC | PRN
  Administered 2016-04-01: 0.4 mg via SUBLINGUAL

## 2016-04-01 MED ORDER — HEPARIN (PORCINE) IN NACL 2-0.9 UNIT/ML-% IJ SOLN
INTRAMUSCULAR | Status: DC | PRN
  Administered 2016-04-01: 10 mL via INTRA_ARTERIAL

## 2016-04-01 MED ORDER — NITROGLYCERIN 0.4 MG SL SUBL: 0.4000 mg | SUBLINGUAL_TABLET | Freq: Once | SUBLINGUAL | Status: DC

## 2016-04-01 MED ORDER — SODIUM CHLORIDE 0.9 % IV SOLN
INTRAVENOUS | Status: DC
  Administered 2016-04-01: 11:00:00 via INTRAVENOUS

## 2016-04-01 MED ORDER — TRAMADOL HCL 50 MG PO TABS
50.0000 mg | ORAL_TABLET | Freq: Four times a day (QID) | ORAL | Status: DC | PRN
  Administered 2016-04-01 – 2016-04-02 (×2): 50 mg via ORAL
  Filled 2016-04-01 (×2): qty 1

## 2016-04-01 MED ORDER — HEPARIN SODIUM (PORCINE) 1000 UNIT/ML IJ SOLN
INTRAMUSCULAR | Status: AC
  Filled 2016-04-01: qty 1

## 2016-04-01 MED ORDER — HEPARIN SODIUM (PORCINE) 1000 UNIT/ML IJ SOLN
INTRAMUSCULAR | Status: DC | PRN
  Administered 2016-04-01: 4000 [IU] via INTRAVENOUS

## 2016-04-01 MED ORDER — HEPARIN (PORCINE) IN NACL 2-0.9 UNIT/ML-% IJ SOLN
INTRAMUSCULAR | Status: DC | PRN
  Administered 2016-04-01: 1000 mL

## 2016-04-01 MED ORDER — MIDAZOLAM HCL 2 MG/2ML IJ SOLN
INTRAMUSCULAR | Status: DC | PRN
  Administered 2016-04-01: 1 mg via INTRAVENOUS

## 2016-04-01 MED ORDER — IOPAMIDOL (ISOVUE-370) INJECTION 76%
INTRAVENOUS | Status: AC
  Filled 2016-04-01: qty 100

## 2016-04-01 MED ORDER — ASPIRIN EC 81 MG PO TBEC
81.0000 mg | DELAYED_RELEASE_TABLET | Freq: Every day | ORAL | Status: DC
  Administered 2016-04-02: 81 mg via ORAL
  Filled 2016-04-01: qty 1

## 2016-04-01 SURGICAL SUPPLY — 10 items
CATH IMPULSE 5F ANG/FL3.5 (CATHETERS) ×2 IMPLANT
DEVICE RAD COMP TR BAND LRG (VASCULAR PRODUCTS) ×2 IMPLANT
GLIDESHEATH SLEND SS 6F .021 (SHEATH) ×2 IMPLANT
GUIDEWIRE INQWIRE 1.5J.035X260 (WIRE) ×1 IMPLANT
INQWIRE 1.5J .035X260CM (WIRE) ×2
KIT HEART LEFT (KITS) ×2 IMPLANT
PACK CARDIAC CATHETERIZATION (CUSTOM PROCEDURE TRAY) ×2 IMPLANT
SYR MEDRAD MARK V 150ML (SYRINGE) ×2 IMPLANT
TRANSDUCER W/STOPCOCK (MISCELLANEOUS) ×2 IMPLANT
TUBING CIL FLEX 10 FLL-RA (TUBING) ×2 IMPLANT

## 2016-04-01 NOTE — Progress Notes (Addendum)
Pt received from [facility] via [facility] alert , IVF infusing   And complaint of chest pressure at a level 2, which was treated with Fentanyl 50 mg IV at 1250p.  Pt on monitor and consent form signed.

## 2016-04-01 NOTE — Progress Notes (Signed)
Pt to procedure room for heart cath via stretcher.

## 2016-04-01 NOTE — H&P (Signed)
Patient ID: ISABELL BONAFEDE MRN: [id number], DOB/AGE: 1940/08/05   Admit date: 04/01/2016   Primary Physician: Kirstie Peri, MD Primary Cardiologist: Dr Diona Browner  HPI: 76 y/o married female, with a history of HTN, lives with her husband, saw Dr Diona Browner in 2010 as a pre op clearance. She did not require any testing.  She has no history of CAD or MI. She tells me she had a "stroke" 2 years ago. This was diagnosed by her PCP. The pt tells me she had "memory problems". She was placed on ASA and Plavix. I could find no CT or MRI in [facility] and the pt says she was never hospitalized. She has noticed DOE and early fatigue, especially when making beds or sweeping. She attributed this to her "stroke". Early this morning she woke up "drenched in sweat" with chest tightness and a feeling "like I was going away". She sat up and her symptoms got worse. She came to the ED at [facility]. She has continued to have chest "tightness". Her Troponin is negative x 3. Her EKG shows NSST changes.    Problem List: Past Medical History:  Diagnosis Date  . Bursitis    in shoulders and hips  . Diverticulitis   . DJD (degenerative joint disease), lumbar   . GERD (gastroesophageal reflux disease)   . HTN (hypertension)   . Hyperlipidemia   . Irritable bowel syndrome   . Stroke [facility])     Past Surgical History:  Procedure Laterality Date  . ABDOMINAL HYSTERECTOMY    . APPENDECTOMY    . CHOLECYSTECTOMY    . HERNIA REPAIR    . LUMBAR SPINE SURGERY    . right shoulder surgery    . tah and bso       Allergies:  Allergies  Allergen Reactions  . Other Shortness Of Breath    Drug: Fledene   . Codeine     REACTION: Nausea/Vomiting     Home Medications  Past Medical History:  Diagnosis Date  . Bursitis    in shoulders and hips  . Diverticulitis   . DJD (degenerative joint disease), lumbar   . GERD (gastroesophageal reflux disease)   . HTN (hypertension)   . Hyperlipidemia   . Irritable bowel syndrome     . Stroke [facility])     Prior to Admission medications   Medication Sig Start Date End Date Taking? Authorizing Provider  aspirin EC 81 MG tablet Take 81 mg by mouth daily.   Yes Historical Provider, MD  bisoprolol-hydrochlorothiazide (ZIAC) 2.5-6.25 MG per tablet Take 1 tablet by mouth daily.   Yes Historical Provider, MD  clopidogrel (PLAVIX) 75 MG tablet Take 75 mg by mouth daily.   Yes Historical Provider, MD  fluticasone (FLONASE) 50 MCG/ACT nasal spray Place 1 spray into both nostrils daily as needed for allergies or rhinitis.   Yes Historical Provider, MD     Family History  Problem Relation Age of Onset  . Cancer Mother   . Diabetes Paternal Grandmother   . Heart attack Maternal Grandfather   . Heart disease Father   . Arthritis Father     crippling  . Alcoholism Brother   . Drug abuse Brother   . Congestive Heart Failure Daughter      Social History   Social History  . Marital status: Married    Spouse name: N/A  . Number of children: N/A  . Years of education: N/A   Occupational History  . Diplomatic Services operational officer  Social History Main Topics  . Smoking status: Never Smoker  . Smokeless tobacco: Never Used  . Alcohol use No  . Drug use: No  . Sexual activity: Not Currently    Birth control/ protection: Surgical     Comment: hyst   Other Topics Concern  . Not on file   Social History Narrative  . No narrative on file     Review of Systems: General: negative for chills, fever, night sweats or weight changes.  Cardiovascular: negative for edema, orthopnea, palpitations, paroxysmal nocturnal dyspnea or shortness of breath HEENT: negative for any visual disturbances, blindness, glaucoma Dermatological: negative for rash Respiratory: negative for cough, hemoptysis, or wheezing Urologic: negative for hematuria or dysuria Abdominal: negative for nausea, vomiting, diarrhea, bright red blood per rectum, melena, or hematemesis Neurologic: negative for visual changes,  syncope, or dizziness Musculoskeletal: negative for back pain, joint pain, or swelling Psych: cooperative and appropriate She has lumbar disc disease-followed by Dr Ethelene Hal. She tells me she "may need surgery".  All other systems reviewed and are otherwise negative except as noted above.  Physical Exam: Blood pressure 96/84, pulse (!) 53, temperature 97.7 F (36.5 C), temperature source Oral, resp. rate 18, height 5' (1.524 m), weight 168 lb (76.2 kg), SpO2 97 %.  General appearance: alert, cooperative, no distress and mildly obese Neck: no carotid bruit and no JVD Lungs: clear to auscultation bilaterally Heart: regular rate and rhythm Abdomen: soft, non-tender; bowel sounds normal; no masses,  no organomegaly Extremities: extremities normal, atraumatic, no cyanosis or edema Pulses: 2+ and symmetric Skin: Skin color, texture, turgor normal. No rashes or lesions Neurologic: Grossly normal, I though she had some weakness to dorsiflexion of her Lt foot.     Labs:   Results for orders placed or performed during the hospital encounter of 04/01/16 (from the past 24 hour(s))  CBC     Status: None   Collection Time: 04/01/16  4:47 AM  Result Value Ref Range   WBC 7.1 4.0 - 10.5 K/uL   RBC 4.48 3.87 - 5.11 MIL/uL   Hemoglobin 13.3 12.0 - 15.0 g/dL   HCT 13.0 86.5 - 78.4 %   MCV 88.8 78.0 - 100.0 fL   MCH 29.7 26.0 - 34.0 pg   MCHC 33.4 30.0 - 36.0 g/dL   RDW 69.6 29.5 - 28.4 %   Platelets 225 150 - 400 K/uL  Differential     Status: None   Collection Time: 04/01/16  4:47 AM  Result Value Ref Range   Neutrophils Relative % 53 %   Neutro Abs 3.8 1.7 - 7.7 K/uL   Lymphocytes Relative 32 %   Lymphs Abs 2.2 0.7 - 4.0 K/uL   Monocytes Relative 11 %   Monocytes Absolute 0.8 0.1 - 1.0 K/uL   Eosinophils Relative 3 %   Eosinophils Absolute 0.2 0.0 - 0.7 K/uL   Basophils Relative 1 %   Basophils Absolute 0.0 0.0 - 0.1 K/uL  Protime-INR     Status: None   Collection Time: 04/01/16  4:47  AM  Result Value Ref Range   Prothrombin Time 12.3 11.4 - 15.2 seconds   INR 0.92   APTT     Status: None   Collection Time: 04/01/16  4:47 AM  Result Value Ref Range   aPTT 26 24 - 36 seconds  Comprehensive metabolic panel     Status: Abnormal   Collection Time: 04/01/16  4:47 AM  Result Value Ref Range   Sodium 139 135 -  145 mmol/L   Potassium 3.3 (L) 3.5 - 5.1 mmol/L   Chloride 105 101 - 111 mmol/L   CO2 22 22 - 32 mmol/L   Glucose, Bld 92 65 - 99 mg/dL   BUN 16 6 - 20 mg/dL   Creatinine, Ser 1.61 (H) 0.44 - 1.00 mg/dL   Calcium 9.2 8.9 - 09.6 mg/dL   Total Protein 7.4 6.5 - 8.1 g/dL   Albumin 4.2 3.5 - 5.0 g/dL   AST 21 15 - 41 U/L   ALT 18 14 - 54 U/L   Alkaline Phosphatase 61 38 - 126 U/L   Total Bilirubin 0.5 0.3 - 1.2 mg/dL   GFR calc non Af Amer 41 (L) >60 mL/min   GFR calc Af Amer 47 (L) >60 mL/min   Anion gap 12 5 - 15  Troponin I     Status: None   Collection Time: 04/01/16  4:47 AM  Result Value Ref Range   Troponin I <0.03 <0.03 ng/mL  Lipid panel     Status: Abnormal   Collection Time: 04/01/16  4:47 AM  Result Value Ref Range   Cholesterol 246 (H) 0 - 200 mg/dL   Triglycerides 045 (H) <150 mg/dL   HDL 55 >40 mg/dL   Total CHOL/HDL Ratio 4.5 RATIO   VLDL 55 (H) 0 - 40 mg/dL   LDL Cholesterol 981 (H) 0 - 99 mg/dL  I-Stat Troponin, ED     Status: None   Collection Time: 04/01/16  4:49 AM  Result Value Ref Range   Troponin i, poc 0.00 0.00 - 0.08 ng/mL   Comment 3          I-Stat Chem 8, ED     Status: Abnormal   Collection Time: 04/01/16  4:50 AM  Result Value Ref Range   Sodium 140 135 - 145 mmol/L   Potassium 3.3 (L) 3.5 - 5.1 mmol/L   Chloride 105 101 - 111 mmol/L   BUN 20 6 - 20 mg/dL   Creatinine, Ser 1.91 (H) 0.44 - 1.00 mg/dL   Glucose, Bld 92 65 - 99 mg/dL   Calcium, Ion 4.78 (L) 1.15 - 1.40 mmol/L   TCO2 24 0 - 100 mmol/L   Hemoglobin 12.9 12.0 - 15.0 g/dL   HCT 29.5 62.1 - 30.8 %  Urinalysis, Routine w reflex microscopic     Status:  Abnormal   Collection Time: 04/01/16  5:15 AM  Result Value Ref Range   Color, Urine YELLOW YELLOW   APPearance CLEAR CLEAR   Specific Gravity, Urine 1.010 1.005 - 1.030   pH 6.0 5.0 - 8.0   Glucose, UA NEGATIVE NEGATIVE mg/dL   Hgb urine dipstick NEGATIVE NEGATIVE   Bilirubin Urine NEGATIVE NEGATIVE   Ketones, ur NEGATIVE NEGATIVE mg/dL   Protein, ur NEGATIVE NEGATIVE mg/dL   Nitrite NEGATIVE NEGATIVE   Leukocytes, UA TRACE (A) NEGATIVE  Urinalysis, Microscopic (reflex)     Status: None   Collection Time: 04/01/16  5:15 AM  Result Value Ref Range   RBC / HPF NONE SEEN 0 - 5 RBC/hpf   WBC, UA 0-5 0 - 5 WBC/hpf   Bacteria, UA NONE SEEN NONE SEEN   Squamous Epithelial / LPF NONE SEEN NONE SEEN  I-stat troponin, ED     Status: None   Collection Time: 04/01/16  6:05 AM  Result Value Ref Range   Troponin i, poc 0.00 0.00 - 0.08 ng/mL   Comment 3  Radiology/Studies: Dg Chest Port 1 View  Result Date: 04/01/2016 CLINICAL DATA:  76 y/o  F; chest tightness and nausea. EXAM: PORTABLE CHEST 1 VIEW COMPARISON:  10/21/2003 chest radiograph FINDINGS: Stable normal cardiac silhouette. Aortic atherosclerosis with calcification. Clear lungs. No pleural effusion or pneumothorax. Bones are unremarkable. IMPRESSION: No active disease. Electronically Signed   By: Mitzi Hansen M.D.   On: 04/01/2016 05:12    EKG:NSR-HR 61, mild diffuse ST depression and slight ST elevation in AVR  ASSESSMENT AND PLAN:   1.- [place]- her history is concerning for [place]-though her Troponin is negative symptoms concerning enough to warrant transfer for cath ASAP  2.- Hypertension- She was hypotensive till recently. Titrate IV NTG as possible  3.- Dyslipidemia- LDL 136 add statin  4.- H/O stroke- This history is per the pt and sounds a little unusual. No documented work up in [employer] but [facility] have been [facility]. She was placed on ASA and Plavix.  5.- Lumbar DJD- she neuropathy Lt>  Rt LE. If she receives a DES that would put any surgery (if needed)_ off 12 months   PLAN: Heparin, ASA, Lipitor, and IV NTG started. No beta blocker with baseline bradycardia. Tx to [facility] for cath.    Jolene Provost, PA-C 04/01/2016, 9:41 AM [phone]  Patient seen and discussed with PA Diona Fanti, I agree with his documentation. 76 yo female history of HTN, [facility] admitted with chest pain. Sudden onset around 3AM. Constant since that time. Initially severe, now down to 3/10. +SOB. +diaphoresis.    Hgb 13.3 Plt 225 K 3.3 Cr 1.26 LDL 136  Trop neg  X 3 CXR no acute process EKG diffuse ST depression, ST elevation aVR    Patient presents with chest pain. Symptoms are mixed, left chest wall tender to palpation as well. Enzymes negative. EKG with diffuse ST depressions, elevation aVR concerning for possible LM disease or severe multivessel CAD. Medical therapy with ASA, heparin gtt, nitro drip. GFR of 40 with upcoming cath, hold ACE/ARB. Soft bp's as well though SBP now back up to 110-120s, hold beta blocker. Will give high dose statin. Given her EKG findings and ongoing symptoms next best step is cath. Awaiting transfer to [facility] for cath once bed available. Will obtain echo while in ER awaiting transfer.   Dina Rich MD

## 2016-04-01 NOTE — ED Notes (Addendum)
Heparin was set on 7.29ml/hr. Just turned up to 50ml/hr. Verified with Hinda Glatter PA, and pharmacy

## 2016-04-01 NOTE — Progress Notes (Signed)
Patient c/o headache, Acetaminophen  given , pain unreliefd, patient says she takes tramadol at home , [place] M,MD paged, verbal order received for Tramadol  Q6H PRN for headache, will continue to monitor

## 2016-04-01 NOTE — ED Notes (Signed)
Pt instructed she remains NPO.

## 2016-04-01 NOTE — ED Notes (Signed)
Cath lab unable to take report at this time  Stated will call back

## 2016-04-01 NOTE — Progress Notes (Signed)
*  PRELIMINARY RESULTS* Echocardiogram 2D Echocardiogram has been performed.  Tamara Roberts 04/01/2016, 10:46 AM

## 2016-04-01 NOTE — ED Triage Notes (Signed)
Pt states she awoke approx 1 hour ago feeling almost sick to her stomach and weak all over, states she feels sick "on the inside"  But not nauseated.  Pt denies pain, denies vomiting or diarrhea.. Pt now admits that she is having some pressure in her chest.

## 2016-04-01 NOTE — Progress Notes (Signed)
Patient arrived the unit on a stretcher from cath lab , assessment completed see flowsheet, patient oriented to room and staff, placed on tele ccdm notified, bed in lowest position, call bell within reach will continue to monitor.

## 2016-04-01 NOTE — Interval H&P Note (Signed)
History and Physical Interval Note:  04/01/2016 1:52 PM  Tamara Roberts  has presented today for surgery, with the diagnosis of cp  The various methods of treatment have been discussed with the patient and family. After consideration of risks, benefits and other options for treatment, the patient has consented to  Procedure(s): Left Heart Cath and Coronary Angiography (N/A) as a surgical intervention .  The patient's history has been reviewed, patient examined, no change in status, stable for surgery.  I have reviewed the patient's chart and labs.  Questions were answered to the patient's satisfaction.    Cath Lab Visit (complete for each Cath Lab visit)  Clinical Evaluation Leading to the Procedure:   ACS: Yes.    Non-ACS:    Anginal Classification: CCS IV  Anti-ischemic medical therapy: No Therapy  Non-Invasive Test Results: No non-invasive testing performed  Prior CABG: No previous CABG       Theron Arista [facility] 04/01/2016 1:52 PM

## 2016-04-01 NOTE — Progress Notes (Addendum)
ANTICOAGULATION CONSULT NOTE   Pharmacy Consult for heparin Indication: chest pain/ACS  Allergies  Allergen Reactions  . Codeine     REACTION: Nausea/Vomiting    Patient Measurements: Height: 5' (152.4 cm) Weight: 168 lb (76.2 kg) IBW/kg (Calculated) : 45.5 HEPARIN DW (KG): 62.7   Vital Signs: Temp: 97.7 F (36.5 C) (03/30 0433) Temp Source: Oral (03/30 0433) BP: 120/63 (03/30 0533) Pulse Rate: 68 (03/30 0533)  Labs:  Recent Labs  04/01/16 0447 04/01/16 0450  HGB 13.3 12.9  HCT 39.8 38.0  PLT 225  --   APTT 26  --   LABPROT 12.3  --   INR 0.92  --   CREATININE 1.26* 1.20*  TROPONINI <0.03  --    Estimated Creatinine Clearance: 37.5 mL/min (A) (by C-G formula based on SCr of 1.2 mg/dL (H)).  Medical History: Past Medical History:  Diagnosis Date  . Bursitis    in shoulders and hips  . Diverticulitis   . GERD (gastroesophageal reflux disease)   . HTN (hypertension)   . Hyperlipidemia   . Irritable bowel syndrome   . Stroke [facility]     Medications:  Scheduled:  . heparin      . heparin  4,000 Units Intravenous Once  . nitroGLYCERIN  5-200 mcg/min Intravenous Once   Infusions:  . sodium chloride 20 mL/hr (04/01/16 0502)  . heparin     PRN: nitroGLYCERIN  Assessment: 76 yo with weakness, chest pressure, SOB. Stroke previously, on plavix, ASA. EKG shows ST depression anterior laterally. Starting heparin, transferring to [facility]  Labs complete, plt nl  Goal of Therapy:  Heparin level 0.3-0.7 units/ml   Plan:  Give 4000 units bolus x 1 Start heparin infusion at 750 units/hr Check anti-Xa level in 6 hours and daily while on heparin Continue to monitor H&H and platelets Preliminary review of pertinent patient information completed.  Jeani Hawking clinical pharmacist will complete review during morning rounds to assess the patient and finalize treatment regimen.  [place], Tiffany Scarlett, RPH 04/01/2016,6:17 AM  Addum:  Increase heparin drip to 1000  units/hr.  Check heparin level in 8 hours. Talbert Cage, PharmD

## 2016-04-01 NOTE — ED Provider Notes (Signed)
[facility]-EMERGENCY DEPT Provider Note   CSN: [id number] Arrival date & time: 04/01/16  0423     History   Chief Complaint Chief Complaint  Patient presents with  . Weakness    HPI KORBIN NOTARO is a 76 y.o. female.  Patient states she woke from sleep approximately 3:30 AM feeling "the life draining out of me". She endorses generalized weakness, nausea but no vomiting. States she woke up sweating. No vomiting or diarrhea. She also admits to having some chest pressure in the center of her chest with shortness of breath. This is been constant for the past hour and she has never had this before. She has a difficult time describing her symptoms but states she feels funniness inside of her and that something is not quite right. Denies any cardiac history. She has had a stroke previously and takes aspirin and Plavix. Denies any new medications. Everything was normal yesterday and when she went to bed everything was normal. Her husband checked her blood sugar and it was 130.  She is not diabetic.    The history is provided by the patient and a relative.  Weakness  Associated symptoms include shortness of breath. Pertinent negatives include no chest pain.    Past Medical History:  Diagnosis Date  . Bursitis    in shoulders and hips  . Diverticulitis   . GERD (gastroesophageal reflux disease)   . HTN (hypertension)   . Hyperlipidemia   . Irritable bowel syndrome   . Stroke [facility])     Patient Active Problem List   Diagnosis Date Noted  . HYPERLIPIDEMIA 09/26/2008  . ESSENTIAL HYPERTENSION, BENIGN 09/26/2008  . CARDIOVASCULAR FUNCTION STUDY, ABNORMAL 09/26/2008    Past Surgical History:  Procedure Laterality Date  . ABDOMINAL HYSTERECTOMY    . APPENDECTOMY    . CHOLECYSTECTOMY    . HERNIA REPAIR    . LUMBAR SPINE SURGERY    . right shoulder surgery    . tah and bso      OB History    Gravida Para Term Preterm AB Living   4         4   SAB TAB Ectopic Multiple Live Births                    Home Medications    Prior to Admission medications   Medication Sig Start Date End Date Taking? Authorizing Provider  aspirin EC 81 MG tablet Take 81 mg by mouth daily.    Historical Provider, MD  bisoprolol-hydrochlorothiazide [facility]) 2.5-6.25 MG per tablet Take 1 tablet by mouth daily.    Historical Provider, MD  ciprofloxacin (CIPRO) 500 MG tablet Take 1 tablet (500 mg total) by mouth 2 (two) times daily. 01/01/16   Adline Potter, NP  clopidogrel (PLAVIX) 75 MG tablet Take 75 mg by mouth daily.    Historical Provider, MD    Family History Family History  Problem Relation Age of Onset  . Cancer Mother   . Diabetes Paternal Grandmother   . Heart attack Maternal Grandfather   . Heart disease Father   . Arthritis Father     crippling  . Alcoholism Brother   . Drug abuse Brother   . Congestive Heart Failure Daughter     Social History Social History  Substance Use Topics  . Smoking status: Never Smoker  . Smokeless tobacco: Never Used  . Alcohol use No     Allergies   Codeine   Review of Systems  Review of Systems  Constitutional: Positive for activity change, appetite change, diaphoresis and fatigue. Negative for fever.  Eyes: Negative for visual disturbance.  Respiratory: Positive for chest tightness and shortness of breath. Negative for cough.   Cardiovascular: Negative for chest pain and leg swelling.  Gastrointestinal: Positive for nausea. Negative for abdominal pain.  Genitourinary: Negative for dysuria, hematuria, vaginal bleeding and vaginal discharge.  Musculoskeletal: Negative for arthralgias and joint swelling.  Neurological: Positive for weakness.   A complete 10 system review of systems was obtained and all systems are negative except as noted in the HPI and PMH.    Physical Exam Updated Vital Signs BP (!) 142/74 (BP Location: Left Arm)   Pulse 68   Temp 97.7 F (36.5 C) (Oral)   Resp 20   Ht 5' (1.524 m)   Wt 168 lb  (76.2 kg)   SpO2 95%   BMI 32.81 kg/m   Physical Exam  Constitutional: She is oriented to person, place, and time. She appears well-developed and well-nourished. No distress.  Anxious appearing  HENT:  Head: Normocephalic and atraumatic.  Mouth/Throat: Oropharynx is clear and moist. No oropharyngeal exudate.  Eyes: Conjunctivae and EOM are normal. Pupils are equal, round, and reactive to light.  Neck: Normal range of motion. Neck supple.  No meningismus.  Cardiovascular: Normal rate, regular rhythm, normal heart sounds and intact distal pulses.   No murmur heard. Equal radial pulses and grip strengths  Pulmonary/Chest: Effort normal and breath sounds normal. No respiratory distress. She exhibits no tenderness.  Abdominal: Soft. There is no tenderness. There is no rebound and no guarding.  Musculoskeletal: Normal range of motion. She exhibits no edema or tenderness.  Neurological: She is alert and oriented to person, place, and time. No cranial nerve deficit. She exhibits normal muscle tone. Coordination normal.   5/5 strength throughout. CN 2-12 intact.Equal grip strength.   Skin: Skin is warm.  Psychiatric: She has a normal mood and affect. Her behavior is normal.  Nursing note and vitals reviewed.    ED Treatments / Results  Labs (all labs ordered are listed, but only abnormal results are displayed) Labs Reviewed  COMPREHENSIVE METABOLIC PANEL - Abnormal; Notable for the following:       Result Value   Potassium 3.3 (*)    Creatinine, Ser 1.26 (*)    GFR calc non Af Amer 41 (*)    GFR calc Af Amer 47 (*)    All other components within normal limits  LIPID PANEL - Abnormal; Notable for the following:    Cholesterol 246 (*)    Triglycerides 273 (*)    VLDL 55 (*)    LDL Cholesterol 136 (*)    All other components within normal limits  URINALYSIS, ROUTINE W REFLEX MICROSCOPIC - Abnormal; Notable for the following:    Leukocytes, UA TRACE (*)    All other components  within normal limits  I-STAT CHEM 8, ED - Abnormal; Notable for the following:    Potassium 3.3 (*)    Creatinine, Ser 1.20 (*)    Calcium, Ion 1.11 (*)    All other components within normal limits  CBC  DIFFERENTIAL  PROTIME-INR  APTT  TROPONIN I  URINALYSIS, MICROSCOPIC (REFLEX)  HEPARIN LEVEL (UNFRACTIONATED)  Rosezena Sensor, ED  Rosezena Sensor, ED    EKG  EKG Interpretation  Date/Time:  Friday April 01 2016 04:40:18 EDT Ventricular Rate:  71 PR Interval:    QRS Duration: 98 QT Interval:  433 QTC Calculation:  471 R Axis:   21 Text Interpretation:  Sinus rhythm Low voltage, precordial leads Abnormal R-wave progression, early transition Borderline ST depression, anterolateral leads Nonspecific ST abnormality Confirmed by Manus Gunning  MD, Cleave Ternes [id number]) on 04/01/2016 4:53:27 AM       Radiology Dg Chest Port 1 View  Result Date: 04/01/2016 CLINICAL DATA:  76 y/o  F; chest tightness and nausea. EXAM: PORTABLE CHEST 1 VIEW COMPARISON:  10/21/2003 chest radiograph FINDINGS: Stable normal cardiac silhouette. Aortic atherosclerosis with calcification. Clear lungs. No pleural effusion or pneumothorax. Bones are unremarkable. IMPRESSION: No active disease. Electronically Signed   By: Mitzi Hansen M.D.   On: 04/01/2016 05:12    Procedures Procedures (including critical care time)  Medications Ordered in ED Medications  0.9 %  sodium chloride infusion (not administered)  aspirin chewable tablet 324 mg (324 mg Oral Given 04/01/16 0456)     Initial Impression / Assessment and Plan / ED Course  I have reviewed the triage vital signs and the nursing notes.  Pertinent labs & imaging results that were available during my care of the patient were reviewed by me and considered in my medical decision making (see chart for details).    Patient presents with generalized not feeling well, nausea, chest pressure and shortness of breath. Presentation concerning for possible  ACS. EKG shows ST depression anterior laterally which is more pronounced than previous. Posterior EKG shows no ST elevation.  ASA and NTG given Troponin negative.  CXR negative.  Chest pressure not improving with NTG.  Gave patient headache. Morphine given.  Serial EKGs unchanged. Posterior EKG without STEMI. IV nitroglycerin started.  Concern for ACS and difficult to control pain d/w Dr. [place] of cardiology.  He accepts patient in transfer to [facility] for catheterization.  Continue IV heparin and IV NTG. BP and mental status remain stable.   CRITICAL CARE Performed by: Glynn Octave Total critical care time: 40 minutes Critical care time was exclusive of separately billable procedures and treating other patients. Critical care was necessary to treat or prevent imminent or life-threatening deterioration. Critical care was time spent personally by me on the following activities: development of treatment plan with patient and/or surrogate as well as nursing, discussions with consultants, evaluation of patient's response to treatment, examination of patient, obtaining history from patient or surrogate, ordering and performing treatments and interventions, ordering and review of laboratory studies, ordering and review of radiographic studies, pulse oximetry and re-evaluation of patient's condition.  Final Clinical Impressions(s) / ED Diagnoses   Final diagnoses:  Unstable angina [facility])    New Prescriptions New Prescriptions   No medications on file     Glynn Octave, MD 04/01/16 [phone]

## 2016-04-02 DIAGNOSIS — I200 Unstable angina: Secondary | ICD-10-CM | POA: Diagnosis not present

## 2016-04-02 DIAGNOSIS — N289 Disorder of kidney and ureter, unspecified: Secondary | ICD-10-CM

## 2016-04-02 DIAGNOSIS — R079 Chest pain, unspecified: Secondary | ICD-10-CM | POA: Diagnosis present

## 2016-04-02 DIAGNOSIS — E876 Hypokalemia: Secondary | ICD-10-CM

## 2016-04-02 MED ORDER — ATORVASTATIN CALCIUM 40 MG PO TABS: 40.0000 mg | ORAL_TABLET | Freq: Every evening | ORAL | 2 refills | Status: DC

## 2016-04-02 NOTE — Care Management Note (Signed)
Case Management Note  Patient Details  Name: Tamara Roberts MRN: [id number] Date of Birth: 01-12-1940  Subjective/Objective:  76 y.o. F admitted 04/01/2016 with CP to be discharged today with no HH needs.                   Action/Plan:CM will sign off for now but will be available should additional discharge needs arise or disposition change.    Expected Discharge Date:  04/02/16               Expected Discharge Plan:  Home/Self Care  In-House Referral:  NA  Discharge planning Services  CM Consult  Post Acute Care Choice:  NA Choice offered to:  Patient  DME Arranged:  N/A DME Agency:  NA  HH Arranged:  NA HH Agency:  NA  Status of Service:  Completed, signed off  If discussed at Long Length of Stay Meetings, dates discussed:    Additional Comments:  Yvone Neu, RN 04/02/2016, 1:34 PM

## 2016-04-02 NOTE — Progress Notes (Signed)
Subjective:  Denies SSCP, palpitations or Dyspnea   Objective:  Vitals:   04/01/16 1730 04/01/16 2003 04/02/16 0506 04/02/16 0935  BP: (!) 107/49 (!) 102/52 (!) 128/58 (!) 112/53  Pulse: 66 (!) 59 65 67  Resp:  17  18  Temp:  99 F (37.2 C) 97.4 F (36.3 C) 97.5 F (36.4 C)  TempSrc:  Oral Oral Oral  SpO2:  97% 97% 98%  Weight:   167 lb 8.8 oz (76 kg)   Height:        Intake/Output from previous day:  Intake/Output Summary (Last 24 hours) at 04/02/16 1151 Last data filed at 04/02/16 0900  Gross per 24 hour  Intake           913.04 ml  Output              450 ml  Net           463.04 ml    Physical Exam: Affect appropriate Healthy:  appears stated age HEENT: normal Neck supple with no adenopathy JVP normal no bruits no thyromegaly Lungs clear with no wheezing and good diaphragmatic motion Heart:  S1/S2 no murmur, no rub, gallop or click PMI normal Abdomen: benighn, BS positve, no tenderness, no AAA no bruit.  No HSM or HJR Distal pulses intact with no bruits No edema Neuro non-focal Skin warm and dry No muscular weakness Right radial cath sight A   Lab Results: Basic Metabolic Panel:  Recent Labs  40/98/11 0447 04/01/16 0450 04/01/16 1533  NA 139 140  --   K 3.3* 3.3*  --   CL 105 105  --   CO2 22  --   --   GLUCOSE 92 92  --   BUN 16 20  --   CREATININE 1.26* 1.20* 1.19*  CALCIUM 9.2  --   --    Liver Function Tests:  Recent Labs  04/01/16 0447  AST 21  ALT 18  ALKPHOS 61  BILITOT 0.5  PROT 7.4  ALBUMIN 4.2   No results for input(s): LIPASE, AMYLASE in the last 72 hours. CBC:  Recent Labs  04/01/16 0447 04/01/16 0450 04/01/16 1533  WBC 7.1  --  6.7  NEUTROABS 3.8  --   --   HGB 13.3 12.9 12.0  HCT 39.8 38.0 36.4  MCV 88.8  --  88.8  PLT 225  --  206   Cardiac Enzymes:  Recent Labs  04/01/16 0447  TROPONINI <0.03   Fasting Lipid Panel:  Recent Labs  04/01/16 0447  CHOL 246*  HDL 55  LDLCALC 136*  TRIG 273*   CHOLHDL 4.5    Imaging: Dg Chest Port 1 View  Result Date: 04/01/2016 CLINICAL DATA:  76 y/o  F; chest tightness and nausea. EXAM: PORTABLE CHEST 1 VIEW COMPARISON:  10/21/2003 chest radiograph FINDINGS: Stable normal cardiac silhouette. Aortic atherosclerosis with calcification. Clear lungs. No pleural effusion or pneumothorax. Bones are unremarkable. IMPRESSION: No active disease. Electronically Signed   By: Mitzi Hansen M.D.   On: 04/01/2016 05:12    Cardiac Studies:  ECG:  NSR no acute ST changes    Telemetry:  NSR 04/02/2016 reviewed   Echo:   Medications:   . aspirin EC  81 mg Oral Daily  . atorvastatin  80 mg Oral q1800  . heparin  5,000 Units Subcutaneous Q8H  . sodium chloride flush  3 mL Intravenous Q12H     . sodium chloride 20 mL/hr (04/01/16 0502)  Assessment/Plan:  Chest Pain: reviewed cath films no significant CAD non cardiac chest pain ok to d/c home outpatient f/u Dr Clelia Croft  Chol:  On statin   Charlton Haws 04/02/2016, 11:51 AM

## 2016-04-02 NOTE — Care Management Obs Status (Signed)
MEDICARE OBSERVATION STATUS NOTIFICATION   Patient Details  Name: Tamara Roberts MRN: [id number] Date of Birth: [birth year]   Medicare Observation Status Notification Given:  Yes    CrutchfieldDerrill Memo, RN 04/02/2016, 1:35 PM

## 2016-04-02 NOTE — Progress Notes (Signed)
I have completed DC from our end. Spoke with nurse to let her know care management had paperwork they needed to finish before they could discharge patient. I gave nurse the CM's phone number and asked her to contact her to find out when patient is formally cleared to leave the hospital. Ronie Spies PA-C

## 2016-04-02 NOTE — Care Management CC44 (Signed)
Condition Code 44 Documentation Completed  Patient Details  Name: Tamara Roberts MRN: [id number] Date of Birth: March 07, 1940   Condition Code 44 given:  Yes Patient signature on Condition Code 44 notice:  Yes Documentation of 2 MD's agreement:  Yes Code 44 added to claim:  Yes    Avaiah Stempel, Derrill Memo, RN 04/02/2016, 1:35 PM

## 2016-04-02 NOTE — Progress Notes (Signed)
Patient taken off the unit on a wheelchair by this RN to the main entrance, patients's son inlaw to take patient home

## 2016-04-02 NOTE — Progress Notes (Signed)
Patient in a stable condition, discharge education reviewed with patient she verbalised understanding, tele dc ccmd notified, iv removed, patient belongings at bedside

## 2016-04-02 NOTE — Significant Event (Addendum)
To the MD rounding on patient today. Please call care management [phone] after seeing patient to inform them of plan of care (or let me know so I can contact them). CM needs to know if she will be going home or staying because her status of inpatient may be incorrect if she goes home.  Because she was admitted as inpatient, if she goes home today, care management must fill out necessary paperwork to correct status order prior to patient leaving out the door. Dayna Dunn PA-C

## 2016-04-02 NOTE — Discharge Summary (Signed)
Discharge Summary    Patient ID: Tamara Roberts,  MRN: [id number], DOB/AGE: 74-Dec-1942 74 y.o.  Admit date: 04/01/2016 Discharge date: 04/02/2016  Primary Care Provider: [facility] Primary Cardiologist: Dr. Lucita Ferrara   Discharge Diagnoses    Principal Problem:   Chest pain Active Problems:   Dyslipidemia   Essential hypertension   DJD (degenerative joint disease), lumbar   History of stroke-(?)   Hypokalemia   Renal insufficiency  Diagnostic Studies/Procedures    1. Cardiac catheterization this admission, please see full report and below for summary. _____________     History of Present Illness     Tamara Roberts is a 76 y.o. female with history of HTN, memory problems, DJD, GERD, HLD, stroke (on ASA/Plavix) who presented to [facility] initially with chest pain. She saw Dr Diona Browner in 2010 as a pre op clearance. She did not require any testing. On morning of admission she became "drenched in sweat" with chest tightness and a feeling "like I was going away," prompting her to see care in the ED. She was admitted for further eval.   Hospital Course    Troponins were negative. Her blood pressure was soft, prompting hold of her bisoprolol-HCTZ. EKG showed diffuse ST depressions, elevation aVR concerning for possible LM disease or severe multivessel CAD. Given findings, she was transferred to [facility] for definitive cath. LDL was 136. 2D Echo showed minimal nonobstructive CAD (15% prox RCA), normal LVF, normal LVEDP. It was felt her CP was noncardiac. 2D echo with moderate LVH, EF 65-70%, no RWMA. The patient feels well today. Dr. Eden Emms has seen and examined the patient today and feelss he is stable for discharge. Of note Cr this admission was 1.19-1.26. Her potassium was also borderline at 3.3 prompting repletion (may be related to HCTZ). She will need further monitoring of this by primary care.  I have sent a message to our [facility] office's scheduler requesting a follow-up appointment  for post cath check, and our office will call the patient with this information. Of note she was started on atorvastatin this admission. If the patient is tolerating statin at time of follow-up appointment, would consider rechecking liver function/lipid panel in 6-8 weeks.  _____________  Discharge Vitals Blood pressure (!) 112/53, pulse 67, temperature 97.5 F (36.4 C), temperature source Oral, resp. rate 18, height 5' (1.524 m), weight 167 lb 8.8 oz (76 kg), SpO2 98 %.  Filed Weights   04/01/16 0433 04/02/16 0506  Weight: 168 lb (76.2 kg) 167 lb 8.8 oz (76 kg)    Labs & Radiologic Studies    CBC  Recent Labs  04/01/16 0447 04/01/16 0450 04/01/16 1533  WBC 7.1  --  6.7  NEUTROABS 3.8  --   --   HGB 13.3 12.9 12.0  HCT 39.8 38.0 36.4  MCV 88.8  --  88.8  PLT 225  --  206   Basic Metabolic Panel  Recent Labs  04/01/16 0447 04/01/16 0450 04/01/16 1533  NA 139 140  --   K 3.3* 3.3*  --   CL 105 105  --   CO2 22  --   --   GLUCOSE 92 92  --   BUN 16 20  --   CREATININE 1.26* 1.20* 1.19*  CALCIUM 9.2  --   --    Liver Function Tests  Recent Labs  04/01/16 0447  AST 21  ALT 18  ALKPHOS 61  BILITOT 0.5  PROT 7.4  ALBUMIN 4.2   Cardiac Enzymes  Recent Labs  04/01/16 0447  TROPONINI <0.03   Fasting Lipid Panel  Recent Labs  04/01/16 0447  CHOL 246*  HDL 55  LDLCALC 136*  TRIG 273*  CHOLHDL 4.5    _____________  Dg Chest Port 1 View  Result Date: 04/01/2016 CLINICAL DATA:  76 y/o  F; chest tightness and nausea. EXAM: PORTABLE CHEST 1 VIEW COMPARISON:  10/21/2003 chest radiograph FINDINGS: Stable normal cardiac silhouette. Aortic atherosclerosis with calcification. Clear lungs. No pleural effusion or pneumothorax. Bones are unremarkable. IMPRESSION: No active disease. Electronically Signed   By: Mitzi Hansen M.D.   On: 04/01/2016 05:12   Disposition   Pt is being discharged home today in good condition.  Follow-up Plans &  Appointments    Follow-up Information    Nona Dell, MD Follow up.   Specialty:  Cardiology Why:  Our office will call you for a follow-up appointment. Please call the office if you have not heard from [place] within 3 days. Contact information: [address] [place] [address] [phone]        [facility], MD Follow up.   Specialty:  Internal Medicine Why:  Your labs showed mild kidney insufficiency and low potassium. Please contact your primary doctor for a recheck within 1 week. Contact information: [address] [place] [address] [phone]          Discharge Instructions    Diet - low sodium heart healthy    Complete by:  As directed    Increase activity slowly    Complete by:  As directed    Your blood pressure was low so we stopped your blood pressure medicine. Please monitor your blood pressure occasionally at home. Call your doctor if you tend to get readings of greater than 130 on the top number or 80 on the bottom number.  No driving for 2 days (unless previously advised not to drive for other reasons). No lifting over 5 lbs for 1 week. No sexual activity for 1 week. Keep procedure site clean & dry. If you notice increased pain, swelling, bleeding or pus, call/return!  You may shower, but no soaking baths/hot tubs/pools for 1 week.      Discharge Medications   Allergies as of 04/02/2016      Reactions   Other Shortness Of Breath   Drug: Fledene   Codeine    REACTION: Nausea/Vomiting      Medication List    STOP taking these medications   bisoprolol-hydrochlorothiazide 2.5-6.25 MG tablet Commonly known as:  ZIAC     TAKE these medications   aspirin EC 81 MG tablet Take 81 mg by mouth daily.   atorvastatin 40 MG tablet Commonly known as:  LIPITOR Take 1 tablet (40 mg total) by mouth every evening.   clopidogrel 75 MG tablet Commonly known as:  PLAVIX Take 75 mg by mouth daily.   fluticasone 50 MCG/ACT nasal spray Commonly known as:   FLONASE Place 1 spray into both nostrils daily as needed for allergies or rhinitis.        Allergies:  Allergies  Allergen Reactions  . Other Shortness Of Breath    Drug: Fledene   . Codeine     REACTION: Nausea/Vomiting    Outstanding Labs/Studies   If the patient is tolerating statin at time of follow-up appointment, would consider rechecking liver function/lipid panel in 6-8 weeks.   Duration of Discharge Encounter   Greater than 30 minutes including physician time.  Signed, Laurann Montana PA-C  04/02/2016, 1:09 PM

## 2016-04-04 ENCOUNTER — Encounter (HOSPITAL_COMMUNITY): Payer: Self-pay | Admitting: Cardiology

## 2016-04-04 DIAGNOSIS — E7800 Pure hypercholesterolemia, unspecified: Secondary | ICD-10-CM | POA: Diagnosis not present

## 2016-04-04 DIAGNOSIS — I10 Essential (primary) hypertension: Secondary | ICD-10-CM | POA: Diagnosis not present

## 2016-04-04 DIAGNOSIS — R0789 Other chest pain: Secondary | ICD-10-CM | POA: Diagnosis not present

## 2016-04-04 DIAGNOSIS — Z6831 Body mass index (BMI) 31.0-31.9, adult: Secondary | ICD-10-CM | POA: Diagnosis not present

## 2016-04-04 DIAGNOSIS — Z713 Dietary counseling and surveillance: Secondary | ICD-10-CM | POA: Diagnosis not present

## 2016-04-04 DIAGNOSIS — F329 Major depressive disorder, single episode, unspecified: Secondary | ICD-10-CM | POA: Diagnosis not present

## 2016-04-04 DIAGNOSIS — M545 Low back pain: Secondary | ICD-10-CM | POA: Diagnosis not present

## 2016-04-05 MED FILL — Ondansetron HCl Inj 4 MG/2ML (2 MG/ML): INTRAMUSCULAR | Qty: 2 | Status: AC

## 2016-04-05 MED FILL — Fentanyl Citrate Preservative Free (PF) Inj 100 MCG/2ML: INTRAMUSCULAR | Qty: 2 | Status: AC

## 2016-04-06 MED FILL — Ondansetron HCl Inj 4 MG/2ML (2 MG/ML): INTRAMUSCULAR | Qty: 2 | Status: AC

## 2016-04-06 MED FILL — Fentanyl Citrate Preservative Free (PF) Inj 100 MCG/2ML: INTRAMUSCULAR | Qty: 2 | Status: AC

## 2016-04-15 ENCOUNTER — Ambulatory Visit (INDEPENDENT_AMBULATORY_CARE_PROVIDER_SITE_OTHER): Payer: PPO | Admitting: Adult Health

## 2016-04-15 ENCOUNTER — Encounter: Payer: Self-pay | Admitting: Adult Health

## 2016-04-15 VITALS — BP 132/86 | HR 75 | Ht 60.0 in | Wt 168.0 lb

## 2016-04-15 DIAGNOSIS — E7800 Pure hypercholesterolemia, unspecified: Secondary | ICD-10-CM | POA: Diagnosis not present

## 2016-04-15 DIAGNOSIS — R0789 Other chest pain: Secondary | ICD-10-CM

## 2016-04-15 NOTE — Patient Instructions (Signed)
Your physician recommends that you schedule a follow-up appointment As Needed  Your physician recommends that you continue on your current medications as directed. Please refer to the Current Medication list given to you today.  If you need a refill on your cardiac medications before your next appointment, please call your pharmacy.  Thank you for choosing [facility]!    

## 2016-04-15 NOTE — Progress Notes (Signed)
Cardiology Office Note   Date:  04/15/2016   ID:  Tamara Roberts, DOB May 04, 1940, MRN [id number]  PCP:  Kirstie Peri, MD  Cardiologist: [place]/  Joni Reining, NP   Chief Complaint  Patient presents with  . Hypertension  . Hospitalization Follow-up      History of Present Illness: Tamara Roberts is a 76 y.o. female who presents for ongoing assessment and management of hypertension, hyperlipidemia, history of CVA (on aspirin and Plavix) who was seen initially at [facility] with chest pain. EKG revealed diffuse ST depression, elevation in aVR concerning for possible left main disease or severe multivessel CAD. Given these findings the patient was transferred to [facility] for cardiac catheterization.  Conclusion     Prox RCA lesion, 15 %stenosed.  The left ventricular systolic function is normal.  LV end diastolic pressure is normal.  The left ventricular ejection fraction is 55-65% by visual estimate.   1. Minimal nonobstructive CAD 2. Normal LV function 3. Normal LVEDP  Plan: consider noncardiac causes of chest pain.   The patient was found to be mildly hypokalemic with potassium of 3.3 prompting repletion in the setting of HCTZ for blood pressure control. She is without further complaints of chest pain. She tells me of her symptoms leading to the admission to the hospital at length.    Past Medical History:  Diagnosis Date  . Bursitis    in shoulders and hips  . Diverticulitis   . DJD (degenerative joint disease), lumbar   . GERD (gastroesophageal reflux disease)   . HTN (hypertension)   . Hyperlipidemia   . Irritable bowel syndrome   . Stroke [facility])     Past Surgical History:  Procedure Laterality Date  . ABDOMINAL HYSTERECTOMY    . APPENDECTOMY    . CHOLECYSTECTOMY    . HERNIA REPAIR    . LEFT HEART CATH AND CORONARY ANGIOGRAPHY N/A 04/01/2016   Procedure: Left Heart Cath and Coronary Angiography;  Surgeon: Peter M Swaziland, MD;   Location: [facility] INVASIVE CV LAB;  Service: Cardiovascular;  Laterality: N/A;  . LUMBAR SPINE SURGERY    . right shoulder surgery    . tah and bso       Current Outpatient Prescriptions  Medication Sig Dispense Refill  . aspirin EC 81 MG tablet Take 81 mg by mouth daily.    Marland Kitchen atorvastatin (LIPITOR) 40 MG tablet Take 1 tablet (40 mg total) by mouth every evening. 30 tablet 2  . clopidogrel (PLAVIX) 75 MG tablet Take 75 mg by mouth daily.    . DULoxetine (CYMBALTA) 30 MG capsule Take 30 mg by mouth at bedtime.    . fluticasone (FLONASE) 50 MCG/ACT nasal spray Place 1 spray into both nostrils daily as needed for allergies or rhinitis.     No current facility-administered medications for this visit.     Allergies:   Other and Codeine    Social History:  The patient  reports that she has never smoked. She has never used smokeless tobacco. She reports that she does not drink alcohol or use drugs.   Family History:  The patient's family history includes Alcoholism in her brother; Arthritis in her father; Cancer in her mother; Congestive Heart Failure in her daughter; Diabetes in her paternal grandmother; Drug abuse in her brother; Heart attack in her maternal grandfather; Heart disease in her father.    ROS: All other systems are reviewed and negative. Unless otherwise mentioned in H&P    PHYSICAL EXAM: VS:  BP 132/86   Pulse 75   Ht 5' (1.524 m)   Wt 168 lb (76.2 kg)   SpO2 98%   BMI 32.81 kg/m  , BMI Body mass index is 32.81 kg/m. GEN: Well nourished, well developed, in no acute distress  HEENT: normal  Neck: no JVD, carotid bruits, or masses Cardiac: RRR; no murmurs, rubs, or gallops,no edema  Respiratory:  clear to auscultation bilaterally, normal work of breathing GI: soft, nontender, nondistended, + BS MS: no deformity or atrophy  Skin: warm and dry, no rash Neuro:  Strength and sensation are intact Psych: euthymic mood, full affect  Recent Labs: 04/17/16: ALT 18; BUN  20; Creatinine, Ser 1.19; Hemoglobin 12.0; Platelets 206; Potassium 3.3; Sodium 140    Lipid Panel    Component Value Date/Time   CHOL 246 (H) 17-Apr-2016 0447   TRIG 273 (H) April 17, 2016 0447   HDL 55 04-17-16 0447   CHOLHDL 4.5 04/17/16 0447   VLDL 55 (H) 04/17/16 0447   LDLCALC 136 (H) 04-17-2016 0447      Wt Readings from Last 3 Encounters:  04/15/16 168 lb (76.2 kg)  04/02/16 167 lb 8.8 oz (76 kg)  01/01/16 167 lb 8 oz (76 kg)      Other studies Reviewed: Echocardiogram Apr 17, 2016 Left ventricle: The cavity size was normal. Wall thickness was   increased in a pattern of moderate LVH. Systolic function was   vigorous. The estimated ejection fraction was in the range of 65%   to 70%. Diastolic function is abnormal, indeterminate grade. Wal.    motion was normal; there were no regional wall motion   abnormalities. - Aortic valve: Valve area (VTI): 1.96 cm^2. Valve area (Vmax):   1.82 cm^2. - Technically adequate study.  ASSESSMENT AND PLAN:  1.  Noncardiac chest pain: Cardiac catheterization was reassuring. The patient will continue on primary prevention with atorvastatin, she will follow up with primary care for ongoing assessment and management of other reasons for chest pain. We will see her when necessary  2. Hypercholesterolemia: She will continue statin therapy as discussed. Follow up her primary care  3. Dyspnea awakening her with chest pressure: Consider sleep study to evaluate for obstructive sleep apnea. Defer to primary care   Current medicines are reviewed at length with the patient today.    Labs/ tests ordered today include:  No orders of the defined types were placed in this encounter.    Disposition:   FU with when necessary  Signed, Joni Reining, NP  04/15/2016 2:47 PM    [facility] [address] [place], [place] [address] Phone: [phone]; Fax: [phone]

## 2016-04-15 NOTE — Progress Notes (Signed)
Name: Tamara Roberts    DOB: [birth year]  Age: 76 y.o.  MR#: [id number]       PCP:  Kirstie Peri, MD      Insurance: Payor: [employer] / Plan: [employer] / Product Type: *No Product type* /   CC:   No chief complaint on file.   VS Vitals:   04/15/16 1345  BP: 132/86  Weight: 168 lb (76.2 kg)  Height: 5' (1.524 m)    Weights Current Weight  04/15/16 168 lb (76.2 kg)  04/02/16 167 lb 8.8 oz (76 kg)  01/01/16 167 lb 8 oz (76 kg)    Blood Pressure  BP Readings from Last 3 Encounters:  04/15/16 132/86  04/02/16 (!) 112/53  01/01/16 130/80     Admit date:  (Not on file) Last encounter with RMR:  Visit date not found   Allergy Other and Codeine  Current Outpatient Prescriptions  Medication Sig Dispense Refill  . aspirin EC 81 MG tablet Take 81 mg by mouth daily.    Marland Kitchen atorvastatin (LIPITOR) 40 MG tablet Take 1 tablet (40 mg total) by mouth every evening. 30 tablet 2  . clopidogrel (PLAVIX) 75 MG tablet Take 75 mg by mouth daily.    . DULoxetine (CYMBALTA) 30 MG capsule Take 30 mg by mouth at bedtime.    . fluticasone (FLONASE) 50 MCG/ACT nasal spray Place 1 spray into both nostrils daily as needed for allergies or rhinitis.     No current facility-administered medications for this visit.     Discontinued Meds:   There are no discontinued medications.  Patient Active Problem List   Diagnosis Date Noted  . Chest pain 04/02/2016  . Hypokalemia 04/02/2016  . Renal insufficiency 04/02/2016  . History of stroke-(?) 04/01/2016  . Dyslipidemia 09/26/2008  . Essential hypertension 09/26/2008  . DJD (degenerative joint disease), lumbar 09/26/2008    LABS    Component Value Date/Time   NA 140 04/01/2016 0450   NA 139 04/01/2016 0447   K 3.3 (L) 04/01/2016 0450   K 3.3 (L) 04/01/2016 0447   CL 105 04/01/2016 0450   CL 105 04/01/2016 0447   CO2 22 04/01/2016 0447   GLUCOSE 92 04/01/2016 0450   GLUCOSE 92 04/01/2016 0447   BUN 20 04/01/2016 0450   BUN 16 04/01/2016 0447   CREATININE 1.19 (H) 04/01/2016 1533   CREATININE 1.20 (H) 04/01/2016 0450   CREATININE 1.26 (H) 04/01/2016 0447   CALCIUM 9.2 04/01/2016 0447   GFRNONAA 44 (L) 04/01/2016 1533   GFRNONAA 41 (L) 04/01/2016 0447   GFRAA 51 (L) 04/01/2016 1533   GFRAA 47 (L) 04/01/2016 0447   CMP     Component Value Date/Time   NA 140 04/01/2016 0450   K 3.3 (L) 04/01/2016 0450   CL 105 04/01/2016 0450   CO2 22 04/01/2016 0447   GLUCOSE 92 04/01/2016 0450   BUN 20 04/01/2016 0450   CREATININE 1.19 (H) 04/01/2016 1533   CALCIUM 9.2 04/01/2016 0447   PROT 7.4 04/01/2016 0447   ALBUMIN 4.2 04/01/2016 0447   AST 21 04/01/2016 0447   ALT 18 04/01/2016 0447   ALKPHOS 61 04/01/2016 0447   BILITOT 0.5 04/01/2016 0447   GFRNONAA 44 (L) 04/01/2016 1533   GFRAA 51 (L) 04/01/2016 1533       Component Value Date/Time   WBC 6.7 04/01/2016 1533   WBC 7.1 04/01/2016 0447   HGB 12.0 04/01/2016 1533   HGB 12.9 04/01/2016 0450   HGB 13.3  04/01/2016 0447   HCT 36.4 04/01/2016 1533   HCT 38.0 04/01/2016 0450   HCT 39.8 04/01/2016 0447   MCV 88.8 04/01/2016 1533   MCV 88.8 04/01/2016 0447    Lipid Panel     Component Value Date/Time   CHOL 246 (H) 04/01/2016 0447   TRIG 273 (H) 04/01/2016 0447   HDL 55 04/01/2016 0447   CHOLHDL 4.5 04/01/2016 0447   VLDL 55 (H) 04/01/2016 0447   LDLCALC 136 (H) 04/01/2016 0447    ABG    Component Value Date/Time   TCO2 24 04/01/2016 0450     No results found for: TSH BNP (last 3 results) No results for input(s): BNP in the last 8760 hours.  ProBNP (last 3 results) No results for input(s): PROBNP in the last 8760 hours.  Cardiac Panel (last 3 results) No results for input(s): CKTOTAL, CKMB, TROPONINI, RELINDX in the last 72 hours.  Iron/TIBC/Ferritin/ %Sat No results found for: IRON, TIBC, FERRITIN, IRONPCTSAT   EKG Orders placed or performed during the hospital encounter of 04/01/16  . EKG 12-Lead  . EKG 12-Lead  . EKG  12-Lead  . EKG 12-Lead  . EKG 12-Lead  . EKG 12-Lead  . EKG 12-Lead  . EKG 12-Lead  . EKG 12-Lead  . EKG 12-Lead  . EKG 12-Lead  . EKG 12-Lead  . EKG     Prior Assessment and Plan Problem List as of 04/15/2016 Reviewed: 04/01/2016  9:39 AM by Corine Shelter, PA-C     Cardiovascular and Mediastinum   Essential hypertension     Musculoskeletal and Integument   DJD (degenerative joint disease), lumbar     Genitourinary   Renal insufficiency     Other   Dyslipidemia   History of stroke-(?)   Chest pain   Hypokalemia       Imaging: Dg Chest Port 1 View  Result Date: 04/01/2016 CLINICAL DATA:  76 y/o  F; chest tightness and nausea. EXAM: PORTABLE CHEST 1 VIEW COMPARISON:  10/21/2003 chest radiograph FINDINGS: Stable normal cardiac silhouette. Aortic atherosclerosis with calcification. Clear lungs. No pleural effusion or pneumothorax. Bones are unremarkable. IMPRESSION: No active disease. Electronically Signed   By: Mitzi Hansen M.D.   On: 04/01/2016 05:12

## 2016-04-18 DIAGNOSIS — I639 Cerebral infarction, unspecified: Secondary | ICD-10-CM | POA: Diagnosis not present

## 2016-04-18 DIAGNOSIS — I10 Essential (primary) hypertension: Secondary | ICD-10-CM | POA: Diagnosis not present

## 2016-04-18 DIAGNOSIS — F329 Major depressive disorder, single episode, unspecified: Secondary | ICD-10-CM | POA: Diagnosis not present

## 2016-04-18 DIAGNOSIS — Z299 Encounter for prophylactic measures, unspecified: Secondary | ICD-10-CM | POA: Diagnosis not present

## 2016-04-18 DIAGNOSIS — M545 Low back pain: Secondary | ICD-10-CM | POA: Diagnosis not present

## 2016-04-18 DIAGNOSIS — Z6831 Body mass index (BMI) 31.0-31.9, adult: Secondary | ICD-10-CM | POA: Diagnosis not present

## 2016-04-26 DIAGNOSIS — M545 Low back pain: Secondary | ICD-10-CM | POA: Diagnosis not present

## 2016-04-26 DIAGNOSIS — M79605 Pain in left leg: Secondary | ICD-10-CM | POA: Diagnosis not present

## 2016-04-26 DIAGNOSIS — M79604 Pain in right leg: Secondary | ICD-10-CM | POA: Diagnosis not present

## 2016-04-26 DIAGNOSIS — M47816 Spondylosis without myelopathy or radiculopathy, lumbar region: Secondary | ICD-10-CM | POA: Diagnosis not present

## 2016-05-09 DIAGNOSIS — M159 Polyosteoarthritis, unspecified: Secondary | ICD-10-CM | POA: Diagnosis not present

## 2016-05-09 DIAGNOSIS — I639 Cerebral infarction, unspecified: Secondary | ICD-10-CM | POA: Diagnosis not present

## 2016-05-09 DIAGNOSIS — I10 Essential (primary) hypertension: Secondary | ICD-10-CM | POA: Diagnosis not present

## 2016-05-09 DIAGNOSIS — E7800 Pure hypercholesterolemia, unspecified: Secondary | ICD-10-CM | POA: Diagnosis not present

## 2016-06-07 DIAGNOSIS — M961 Postlaminectomy syndrome, not elsewhere classified: Secondary | ICD-10-CM | POA: Diagnosis not present

## 2016-06-21 DIAGNOSIS — Z299 Encounter for prophylactic measures, unspecified: Secondary | ICD-10-CM | POA: Diagnosis not present

## 2016-06-21 DIAGNOSIS — F329 Major depressive disorder, single episode, unspecified: Secondary | ICD-10-CM | POA: Diagnosis not present

## 2016-06-21 DIAGNOSIS — M545 Low back pain: Secondary | ICD-10-CM | POA: Diagnosis not present

## 2016-06-21 DIAGNOSIS — Z6832 Body mass index (BMI) 32.0-32.9, adult: Secondary | ICD-10-CM | POA: Diagnosis not present

## 2016-06-21 DIAGNOSIS — I639 Cerebral infarction, unspecified: Secondary | ICD-10-CM | POA: Diagnosis not present

## 2016-06-21 DIAGNOSIS — I10 Essential (primary) hypertension: Secondary | ICD-10-CM | POA: Diagnosis not present

## 2016-06-21 DIAGNOSIS — E7800 Pure hypercholesterolemia, unspecified: Secondary | ICD-10-CM | POA: Diagnosis not present

## 2016-06-21 DIAGNOSIS — Z713 Dietary counseling and surveillance: Secondary | ICD-10-CM | POA: Diagnosis not present

## 2016-06-29 DIAGNOSIS — M19011 Primary osteoarthritis, right shoulder: Secondary | ICD-10-CM | POA: Diagnosis not present

## 2016-06-29 DIAGNOSIS — Z6832 Body mass index (BMI) 32.0-32.9, adult: Secondary | ICD-10-CM | POA: Diagnosis not present

## 2016-06-29 DIAGNOSIS — M810 Age-related osteoporosis without current pathological fracture: Secondary | ICD-10-CM | POA: Diagnosis not present

## 2016-06-29 DIAGNOSIS — E7800 Pure hypercholesterolemia, unspecified: Secondary | ICD-10-CM | POA: Diagnosis not present

## 2016-06-29 DIAGNOSIS — I10 Essential (primary) hypertension: Secondary | ICD-10-CM | POA: Diagnosis not present

## 2016-06-29 DIAGNOSIS — M545 Low back pain: Secondary | ICD-10-CM | POA: Diagnosis not present

## 2016-06-29 DIAGNOSIS — G8929 Other chronic pain: Secondary | ICD-10-CM | POA: Diagnosis not present

## 2016-06-29 DIAGNOSIS — F329 Major depressive disorder, single episode, unspecified: Secondary | ICD-10-CM | POA: Diagnosis not present

## 2016-06-29 DIAGNOSIS — M19012 Primary osteoarthritis, left shoulder: Secondary | ICD-10-CM | POA: Diagnosis not present

## 2016-06-29 DIAGNOSIS — Z299 Encounter for prophylactic measures, unspecified: Secondary | ICD-10-CM | POA: Diagnosis not present

## 2016-07-18 ENCOUNTER — Other Ambulatory Visit: Payer: Self-pay | Admitting: Neurological Surgery

## 2016-07-18 DIAGNOSIS — M4316 Spondylolisthesis, lumbar region: Secondary | ICD-10-CM | POA: Diagnosis not present

## 2016-08-01 DIAGNOSIS — Z7189 Other specified counseling: Secondary | ICD-10-CM | POA: Diagnosis not present

## 2016-08-01 DIAGNOSIS — Z1211 Encounter for screening for malignant neoplasm of colon: Secondary | ICD-10-CM | POA: Diagnosis not present

## 2016-08-01 DIAGNOSIS — F419 Anxiety disorder, unspecified: Secondary | ICD-10-CM | POA: Diagnosis not present

## 2016-08-01 DIAGNOSIS — Z6831 Body mass index (BMI) 31.0-31.9, adult: Secondary | ICD-10-CM | POA: Diagnosis not present

## 2016-08-01 DIAGNOSIS — I639 Cerebral infarction, unspecified: Secondary | ICD-10-CM | POA: Diagnosis not present

## 2016-08-01 DIAGNOSIS — E559 Vitamin D deficiency, unspecified: Secondary | ICD-10-CM | POA: Diagnosis not present

## 2016-08-01 DIAGNOSIS — E7800 Pure hypercholesterolemia, unspecified: Secondary | ICD-10-CM | POA: Diagnosis not present

## 2016-08-01 DIAGNOSIS — Z1389 Encounter for screening for other disorder: Secondary | ICD-10-CM | POA: Diagnosis not present

## 2016-08-01 DIAGNOSIS — F329 Major depressive disorder, single episode, unspecified: Secondary | ICD-10-CM | POA: Diagnosis not present

## 2016-08-01 DIAGNOSIS — I10 Essential (primary) hypertension: Secondary | ICD-10-CM | POA: Diagnosis not present

## 2016-08-01 DIAGNOSIS — Z79899 Other long term (current) drug therapy: Secondary | ICD-10-CM | POA: Diagnosis not present

## 2016-08-01 DIAGNOSIS — R5383 Other fatigue: Secondary | ICD-10-CM | POA: Diagnosis not present

## 2016-08-01 DIAGNOSIS — Z0000 Encounter for general adult medical examination without abnormal findings: Secondary | ICD-10-CM | POA: Diagnosis not present

## 2016-08-01 DIAGNOSIS — Z299 Encounter for prophylactic measures, unspecified: Secondary | ICD-10-CM | POA: Diagnosis not present

## 2016-08-15 DIAGNOSIS — J329 Chronic sinusitis, unspecified: Secondary | ICD-10-CM | POA: Diagnosis not present

## 2016-08-15 DIAGNOSIS — Z299 Encounter for prophylactic measures, unspecified: Secondary | ICD-10-CM | POA: Diagnosis not present

## 2016-08-15 DIAGNOSIS — Z789 Other specified health status: Secondary | ICD-10-CM | POA: Diagnosis not present

## 2016-08-15 DIAGNOSIS — E669 Obesity, unspecified: Secondary | ICD-10-CM | POA: Diagnosis not present

## 2016-08-15 DIAGNOSIS — I639 Cerebral infarction, unspecified: Secondary | ICD-10-CM | POA: Diagnosis not present

## 2016-08-23 ENCOUNTER — Encounter (HOSPITAL_COMMUNITY)
Admission: RE | Admit: 2016-08-23 | Discharge: 2016-08-23 | Disposition: A | Discharge status: Home / Self Care | Payer: PPO | Source: Ambulatory Visit | Attending: Neurological Surgery | Admitting: Neurological Surgery

## 2016-08-23 ENCOUNTER — Ambulatory Visit (HOSPITAL_COMMUNITY)
Admission: RE | Admit: 2016-08-23 | Discharge: 2016-08-23 | Disposition: A | Discharge status: Home / Self Care | Payer: PPO | Source: Ambulatory Visit | Attending: Neurological Surgery | Admitting: Neurological Surgery

## 2016-08-23 ENCOUNTER — Encounter (HOSPITAL_COMMUNITY): Payer: Self-pay

## 2016-08-23 DIAGNOSIS — Z79899 Other long term (current) drug therapy: Secondary | ICD-10-CM

## 2016-08-23 DIAGNOSIS — M4310 Spondylolisthesis, site unspecified: Secondary | ICD-10-CM

## 2016-08-23 DIAGNOSIS — Z90722 Acquired absence of ovaries, bilateral: Secondary | ICD-10-CM | POA: Insufficient documentation

## 2016-08-23 DIAGNOSIS — Z6832 Body mass index (BMI) 32.0-32.9, adult: Secondary | ICD-10-CM | POA: Diagnosis not present

## 2016-08-23 DIAGNOSIS — Z7902 Long term (current) use of antithrombotics/antiplatelets: Secondary | ICD-10-CM

## 2016-08-23 DIAGNOSIS — Z8261 Family history of arthritis: Secondary | ICD-10-CM | POA: Diagnosis not present

## 2016-08-23 DIAGNOSIS — Z79891 Long term (current) use of opiate analgesic: Secondary | ICD-10-CM | POA: Diagnosis not present

## 2016-08-23 DIAGNOSIS — Z90710 Acquired absence of both cervix and uterus: Secondary | ICD-10-CM | POA: Diagnosis not present

## 2016-08-23 DIAGNOSIS — Z8673 Personal history of transient ischemic attack (TIA), and cerebral infarction without residual deficits: Secondary | ICD-10-CM

## 2016-08-23 DIAGNOSIS — I119 Hypertensive heart disease without heart failure: Secondary | ICD-10-CM | POA: Diagnosis present

## 2016-08-23 DIAGNOSIS — I700 Atherosclerosis of aorta: Secondary | ICD-10-CM | POA: Insufficient documentation

## 2016-08-23 DIAGNOSIS — K219 Gastro-esophageal reflux disease without esophagitis: Secondary | ICD-10-CM | POA: Diagnosis present

## 2016-08-23 DIAGNOSIS — M47816 Spondylosis without myelopathy or radiculopathy, lumbar region: Secondary | ICD-10-CM | POA: Diagnosis present

## 2016-08-23 DIAGNOSIS — E785 Hyperlipidemia, unspecified: Secondary | ICD-10-CM | POA: Diagnosis present

## 2016-08-23 DIAGNOSIS — Z7951 Long term (current) use of inhaled steroids: Secondary | ICD-10-CM

## 2016-08-23 DIAGNOSIS — M4316 Spondylolisthesis, lumbar region: Secondary | ICD-10-CM | POA: Diagnosis present

## 2016-08-23 DIAGNOSIS — Z885 Allergy status to narcotic agent status: Secondary | ICD-10-CM | POA: Diagnosis not present

## 2016-08-23 DIAGNOSIS — Z7982 Long term (current) use of aspirin: Secondary | ICD-10-CM | POA: Diagnosis not present

## 2016-08-23 DIAGNOSIS — M5136 Other intervertebral disc degeneration, lumbar region: Secondary | ICD-10-CM | POA: Diagnosis not present

## 2016-08-23 DIAGNOSIS — Z0183 Encounter for blood typing: Secondary | ICD-10-CM | POA: Insufficient documentation

## 2016-08-23 DIAGNOSIS — Z01818 Encounter for other preprocedural examination: Secondary | ICD-10-CM

## 2016-08-23 DIAGNOSIS — Z886 Allergy status to analgesic agent status: Secondary | ICD-10-CM | POA: Diagnosis not present

## 2016-08-23 DIAGNOSIS — Z8249 Family history of ischemic heart disease and other diseases of the circulatory system: Secondary | ICD-10-CM | POA: Diagnosis not present

## 2016-08-23 DIAGNOSIS — Z4789 Encounter for other orthopedic aftercare: Secondary | ICD-10-CM | POA: Diagnosis not present

## 2016-08-23 DIAGNOSIS — Z9049 Acquired absence of other specified parts of digestive tract: Secondary | ICD-10-CM | POA: Diagnosis not present

## 2016-08-23 DIAGNOSIS — K589 Irritable bowel syndrome without diarrhea: Secondary | ICD-10-CM | POA: Diagnosis present

## 2016-08-23 DIAGNOSIS — E669 Obesity, unspecified: Secondary | ICD-10-CM | POA: Diagnosis present

## 2016-08-23 DIAGNOSIS — Z01812 Encounter for preprocedural laboratory examination: Secondary | ICD-10-CM | POA: Insufficient documentation

## 2016-08-23 DIAGNOSIS — I10 Essential (primary) hypertension: Secondary | ICD-10-CM | POA: Diagnosis not present

## 2016-08-23 DIAGNOSIS — I517 Cardiomegaly: Secondary | ICD-10-CM | POA: Diagnosis not present

## 2016-08-23 LAB — BASIC METABOLIC PANEL
ANION GAP: 10 (ref 5–15)
BUN: 18 mg/dL (ref 6–20)
CO2: 22 mmol/L (ref 22–32)
Calcium: 9.5 mg/dL (ref 8.9–10.3)
Chloride: 108 mmol/L (ref 101–111)
Creatinine, Ser: 1.31 mg/dL — ABNORMAL HIGH (ref 0.44–1.00)
GFR calc Af Amer: 45 mL/min — ABNORMAL LOW (ref >60)
GFR calc non Af Amer: 39 mL/min — ABNORMAL LOW (ref >60)
GLUCOSE: 98 mg/dL (ref 65–99)
POTASSIUM: 3.8 mmol/L (ref 3.5–5.1)
Sodium: 140 mmol/L (ref 135–145)

## 2016-08-23 LAB — CBC WITH DIFFERENTIAL/PLATELET
BASOS ABS: 0.1 10*3/uL (ref 0.0–0.1)
Basophils Relative: 1 %
EOS PCT: 2 %
Eosinophils Absolute: 0.2 10*3/uL (ref 0.0–0.7)
HEMATOCRIT: 45.1 % (ref 36.0–46.0)
Hemoglobin: 14.9 g/dL (ref 12.0–15.0)
LYMPHS ABS: 2.2 10*3/uL (ref 0.7–4.0)
LYMPHS PCT: 26 %
MCH: 29.5 pg (ref 26.0–34.0)
MCHC: 33 g/dL (ref 30.0–36.0)
MCV: 89.3 fL (ref 78.0–100.0)
MONO ABS: 0.8 10*3/uL (ref 0.1–1.0)
Monocytes Relative: 10 %
NEUTROS ABS: 5.3 10*3/uL (ref 1.7–7.7)
Neutrophils Relative %: 61 %
PLATELETS: 271 10*3/uL (ref 150–400)
RBC: 5.05 MIL/uL (ref 3.87–5.11)
RDW: 13 % (ref 11.5–15.5)
WBC: 8.6 10*3/uL (ref 4.0–10.5)

## 2016-08-23 LAB — SURGICAL PCR SCREEN
MRSA, PCR: NEGATIVE
STAPHYLOCOCCUS AUREUS: NEGATIVE

## 2016-08-23 LAB — TYPE AND SCREEN
ABO/RH(D): A NEG
ANTIBODY SCREEN: NEGATIVE

## 2016-08-23 LAB — ABO/RH: ABO/RH(D): A NEG

## 2016-08-23 LAB — PROTIME-INR
INR: 0.97
Prothrombin Time: 12.9 seconds (ref 11.4–15.2)

## 2016-08-23 NOTE — Pre-Procedure Instructions (Signed)
Tamara Roberts  08/23/2016      [facility] [address], [place] - [address] [place] [address] Phone: [phone] Fax: [phone]    Your procedure is scheduled on : Thursday, August 23.  Report to [facility] Admitting at 5:30 AM                     Your surgery or procedure is scheduled for 7:30 AM   Call this number if you have problems the morning of surgery: [phone]- pre op desk.   Remember:  Do not eat food or drink liquids after midnight Wednesday.   Take these medicines the morning of surgery with A SIP OF WATER : May take if needed- traMADol Janean Sark).               STOP taking Aspirin , Aspirin Products (Goody Powder, Excedrin Migraine), Ibuprofen (Advil), Naproxen (Aleve), Vitamins and Herbal Products (ie Fish Oil)                Stop Plavix as  Instructed by Dr Yetta Barre              Special Instructions:  [place]- Preparing For Surgery  Before surgery, you can play an important role. Because skin is not sterile, your skin needs to be as free of germs as possible. You can reduce the number of germs on your skin by washing with CHG (chlorahexidine gluconate) Soap before surgery.  CHG is an antiseptic cleaner which kills germs and bonds with the skin to continue killing germs even after washing.  Please do not use if you have an allergy to CHG or antibacterial soaps. If your skin becomes reddened/irritated stop using the CHG.  Do not shave (including legs and underarms) for at least 48 hours prior to first CHG shower. It is OK to shave your face.  Please follow these instructions carefully.   1. Shower the NIGHT BEFORE SURGERY and the MORNING OF SURGERY with CHG.   2. If you chose to wash your hair, wash your hair first as usual with your normal shampoo.  3. After you shampoo, rinse your hair and body thoroughly to remove the shampoo.   Wash your face and private area with the soap you use at home, then rinse. 4.  5. Use  CHG as you would any other liquid soap. You can apply CHG directly to the skin and wash gently with a scrungie or a clean washcloth.   6. Apply the CHG Soap to your body ONLY FROM THE NECK DOWN.  Do not use on open wounds or open sores. Avoid contact with your eyes, ears, mouth and genitals (private parts). Wash genitals (private parts) with your normal soap.  7. Wash thoroughly, paying special attention to the area where your surgery will be performed.  8. Thoroughly rinse your body with warm water from the neck down.  9. DO NOT shower/wash with your normal soap after using and rinsing off the CHG Soap.  10. Pat yourself dry with a CLEAN TOWEL.   11. Wear CLEAN PAJAMAS   12. Place CLEAN SHEETS on your bed the night of your first shower and DO NOT SLEEP WITH PETS.  Day of Surgery: Shower as above. Do not apply any deodorants/lotions, perfume, powders.. Please wear clean clothes to the hospital/surgery center.    Do not wear jewelry, make-up or nail polish.  Do not shave 48 hours  prior to surgery.  Men may shave face and neck.  Do not bring valuables to the hospital.  [facility] is not responsible for any belongings or valuables.  Contacts, dentures or bridgework may not be worn into surgery.  Leave your suitcase in the car.  After surgery it may be brought to your room.  For patients admitted to the hospital, discharge time will be determined by your treatment team.  Patients discharged the day of surgery will not be allowed to drive home.   Please read over the following fact sheets that you were given: Pain Booklet, Patient Instructions for Mupirocin Application, Coughing and Deep Breathing,  Surgical Site Infections.

## 2016-08-23 NOTE — Progress Notes (Signed)
PCp is Dr. Kirstie Peri Cardiologist is Dr Alveria Apley Reports last dose of ASA and Plavix was 08-20-16 Denies any chest pain, Fever, or cough.  Echo noted from 04-01-16 Cath noted from 04-01-16

## 2016-08-24 ENCOUNTER — Encounter (HOSPITAL_COMMUNITY): Payer: Self-pay | Admitting: Anesthesiology

## 2016-08-24 MED ORDER — CEFAZOLIN SODIUM-DEXTROSE 2-4 GM/100ML-% IV SOLN
2.0000 g | INTRAVENOUS | Status: AC
  Administered 2016-08-25: 2 g via INTRAVENOUS
  Filled 2016-08-24: qty 100

## 2016-08-24 NOTE — Progress Notes (Signed)
Anesthesia Chart Review: Patient is a 76 year old female scheduled for L4-5 PLIF on 08/25/16 by Dr. Marikay Alar.   History includes never smoker, HLD, HTN, GERD, CVA, IBS, cholecystectomy, TAH/BSO, L2-3 microdiskectomy '02, hysterectomy, hernia repair. BMI is consistent with obesity.   - PCP is Dr. Kirstie Peri.  - Cardiologist is Dr. Nona Dell. Last visit Dr. Joni Reining, NP on 04/15/16 for hospitalization follow-up for chest pain in which cardiac cath showed only minimal CAD (15% proximal RCA). PRN cardiology recommended. She was referred back to her PCP for consideration of sleep study.    Meds include ASA 81 mg, Lipitor, chlorpheniramine, Plavix, Cymbalta, Flonase, Lyrica, Restoril, tramadol. Patient reported she was instructed to hold ASA and Plavix (last dose 08/20/16).  BP (!) 155/70   Pulse 82   Temp 36.7 C   Resp 20   Ht 5' (1.524 m)   Wt 169 lb 3.2 oz (76.7 kg)   SpO2 98%   BMI 33.04 kg/m   Cardiac cath 04/01/16:  Prox RCA lesion, 15 %stenosed.  The left ventricular systolic function is normal.  LV end diastolic pressure is normal.  The left ventricular ejection fraction is 55-65% by visual estimate.  1. Minimal nonobstructive CAD 2. Normal LV function 3. Normal LVEDP Plan: consider noncardiac causes of chest pain.  Echo 04/01/16: Study Conclusions - Left ventricle: The cavity size was normal. Wall thickness was   increased in a pattern of moderate LVH. Systolic function was   vigorous. The estimated ejection fraction was in the range of 65%   to 70%. Diastolic function is abnormal, indeterminate grade. Wall   motion was normal; there were no regional wall motion   abnormalities. - Aortic valve: Valve area (VTI): 1.96 cm^2. Valve area (Vmax):   1.82 cm^2. - Technically adequate study.  EKG 04/01/16: SR, low voltage precordial leads, abnormal R wave progression, early transition. Borderline ST depression, anterolateral leads.   CXR 08/23/16: IMPRESSION: No  acute cardiopulmonary disease. Mild cardiomegaly.  Aortic Atherosclerosis (ICD10-I70.0).  Preoperative labs noted. Cr 1.31. Glucose 98. CBC WNL. PT/INR 12.9/0.97.   If no acute changes then I would anticipate that she can proceed as planned.  Velna Ochs [facility] Stay [facility]/Anesthesiology Phone [phone] 08/24/2016 9:37 AM

## 2016-08-24 NOTE — Anesthesia Preprocedure Evaluation (Addendum)
Anesthesia Evaluation  Patient identified by MRN, date of birth, ID band Patient awake    Airway Mallampati: I       Dental  (+) Poor Dentition, Dental Advisory Given,    Pulmonary neg pulmonary ROS,    Pulmonary exam normal breath sounds clear to auscultation       Cardiovascular hypertension, Pt. on medications Normal cardiovascular exam Rhythm:Regular Rate:Normal     Neuro/Psych    GI/Hepatic   Endo/Other    Renal/GU   negative genitourinary   Musculoskeletal   Abdominal (+) + obese,   Peds  Hematology   Anesthesia Other Findings DORINNE GRAEFF  CARDIAC CATHETERIZATION  Order# [id number]  Reading physician: [place], Peter M, MD Ordering physician: [place], Peter M, MD Study date: 04/01/16 Physicians   Panel Physicians Referring Physician Case Authorizing Physician [place], Peter M, MD (Primary)   Procedures   Left Heart Cath and Coronary Angiography Conclusion     Prox RCA lesion, 15 %stenosed.  The left ventricular systolic function is normal.  LV end diastolic pressure is normal.  The left ventricular ejection fraction is 55-65% by visual estimate.   1. Minimal nonobstructive CAD 2. Normal LV function 3. Normal LVEDP  Plan: consider noncardiac causes of chest pain.  Indications   Chest pain, unspecified type (R07.9 (ICD-10-CM)) Procedural Details/Technique   Technical Details Indication: 76 yo WF presents with acute chest pain concerning for unstable angina  Procedural Details: The right wrist was prepped, draped, and anesthetized with 1% lidocaine. Using the modified Seldinger technique, a 6 French slender sheath was introduced into the right radial artery. 3 mg of verapamil was administered through the sheath, weight-based unfractionated heparin was administered intravenously. Standard Judkins catheters were used for selective coronary angiography and left ventriculography. Catheter  exchanges were performed over an exchange length guidewire. There were no immediate procedural complications. A TR band was used for radial hemostasis at the completion of the procedure. The patient was transferred to the post catheterization recovery area for further monitoring.  Contrast: 60 cc   Estimated blood loss <50 mL.  During this procedure the patient was administered the following to achieve and maintain moderate conscious sedation: Versed 1 mg, Fentanyl 25 mcg, while the patient's heart rate, blood pressure, and oxygen saturation were continuously monitored. The period of conscious sedation was 18 minutes, of which I was present face-to-face 100% of this time.  Complications   Complications documented before study signed (04/01/2016 2:19 PM EDT)   No complications were associated with this study. Documented by [place], Peter M, MD - 04/01/2016 2:19 PM EDT  Coronary Findings   Dominance: Right Left Main Vessel was injected. Vessel is normal in caliber. Vessel is angiographically normal. Left Anterior Descending Vessel was injected. Vessel is normal in caliber. Vessel is angiographically normal. Ramus Intermedius Vessel was injected. Vessel is normal in caliber. Vessel is angiographically normal. Left Circumflex Vessel was injected. Vessel is normal in caliber. Vessel is angiographically normal. Right Coronary Artery Prox RCA lesion, 15% stenosed. Wall Motion         Left Heart   Left Ventricle The left ventricular size is normal. The left ventricular systolic function is normal. LV end diastolic pressure is normal. The left ventricular ejection fraction is 55-65% by visual estimate. No regional wall motion abnormalities.  Coronary Diagrams   Diagnostic Diagram      Implants     No implant documentation for this case. PACS Images    Show images for Cardiac catheterization  Link  to Procedure Log    Procedure Log  Hemo Data    Most Recent Value AO  Systolic Pressure 117 mmHg AO Diastolic Pressure 57 mmHg AO Mean 81 mmHg LV Systolic Pressure 116 mmHg LV Diastolic Pressure 6 mmHg LV EDP 13 mmHg Arterial Occlusion Pressure Extended Systolic Pressure 118 mmHg Arterial Occlusion Pressure Extended Diastolic Pressure 56 mmHg Arterial Occlusion Pressure Extended Mean Pressure 82 mmHg Left Ventricular Apex Extended Systolic Pressure 125 mmHg Left Ventricular Apex Extended Diastolic Pressure 8 mmHg Left Ventricular Apex Extended EDP Pressure 21 mmHg Order-Level Documents - 04/01/2016:   Scan on 04/02/2016 3:19 PM by Default, Provider, MDScan on 04/02/2016 3:19 PM by Default, Provider, MD    Encounter-Level Documents - 04/01/2016:   Scan on 04/06/2016 3:15 PM by Default, Provider, MDScan on 04/06/2016 3:15 PM by Default, Provider, MD  Scan on 04/05/2016 4:48 PM by Default, Provider, MDScan on 04/05/2016 4:48 PM by Default, Provider, MD  Document on 04/05/2016 1:42 PM by Alda Berthold, RN : ED PB Summary  Document on 04/05/2016 1:42 PM by Alda Berthold, RN : ED Encounter Summary  Scan on 04/04/2016 12:02 PM by Default, Provider, MDScan on 04/04/2016 12:02 PM by Default, Provider, MD  Scan on 04/04/2016 11:52 AM by Default, Provider, MDScan on 04/04/2016 11:52 AM by Default, Provider, MD  Document on 04/02/2016 1:35 PM by Donell Beers, RN : IP After Visit Summary  Scan on 04/02/2016 9:05 AM by Default, Provider, MDScan on 04/02/2016 9:05 AM by Default, Provider, MD  Scan on 04/01/2016 3:39 PM by Default, Provider, MDScan on 04/01/2016 3:39 PM by Default, Provider, MD  Scan on 04/01/2016 2:12 PM by Default, Provider, MDScan on 04/01/2016 2:12 PM by Default, Provider, MD  Electronic signature on 04/01/2016 12:06 PM  Scan on 04/01/2016 11:14 AM by Default, Provider, MDScan on 04/01/2016 11:14 AM by Default, Provider, MD  Electronic signature on 04/01/2016 5:48 AM  Electronic signature on 04/01/2016 5:47 AM    Signed   Electronically signed by [place], Peter  M, MD on 04/01/16 at 1419 EDT External Result Report   External Result Report  MONISHA SIEBEL  ECHO COMPLETE WO IMAGE ENHANCING AGENT  Order# [id number]  Reading physician: Antoine Poche, MD Ordering physician: Antoine Poche, MD Study date: 04/01/16 Study Result   Result status: Final result                     *[facility]*                         [address]                        [place], [place] [address]                            [phone]  ------------------------------------------------------------------- Transthoracic Echocardiography  Patient:    Tamara Roberts, Tamara Roberts MR #:       [id number] Study Date: 04/01/2016 Gender:     F Age:        57 Height:     152.4 cm Weight:     76.2 kg BSA:        1.83 m^2 Pt. Status: Room:   ATTENDING    Nona Dell, M.D.  SONOGRAPHER  [facility]  ORDERING     Patrick Jupiter, M.D.  REFERRING  Patrick Jupiter, M.D.  PERFORMING   [facility] [place]  cc:  ------------------------------------------------------------------- LV EF: 65% -   70%  ------------------------------------------------------------------- Indications:      Chest pain 786.51.  ------------------------------------------------------------------- History:   PMH:   Stroke.  Risk factors:  Hypertension. Dyslipidemia.  ------------------------------------------------------------------- Study Conclusions  - Left ventricle: The cavity size was normal. Wall thickness was   increased in a pattern of moderate LVH. Systolic function was   vigorous. The estimated ejection fraction was in the range of 65%   to 70%. Diastolic function is abnormal, indeterminate grade. Wall   motion was normal; there were no regional wall motion   abnormalities. - Aortic valve: Valve area (VTI): 1.96 cm^2. Valve area (Vmax):   1.82 cm^2. - Technically adequate  study.  ------------------------------------------------------------------- Study data:  No prior study was available for comparison.  Study status:  Routine.  Procedure:  Transthoracic echocardiography. Image quality was adequate.          Transthoracic echocardiography.  M-mode, complete 2D, spectral Doppler, and color Doppler.  Birthdate:  Patient birthdate: [birth year].  Age:  Patient is 76 yr old.  Sex:  Gender: female.    BMI: 32.8 kg/m^2.  Blood pressure:     113/86  Patient status:  Inpatient.  Study date: Study date: 04/01/2016. Study time: 10:13 AM.  Location:  Bedside.   -------------------------------------------------------------------  ------------------------------------------------------------------- Left ventricle:  The cavity size was normal. Wall thickness was increased in a pattern of moderate LVH. Systolic function was vigorous. The estimated ejection fraction was in the range of 65% to 70%. Diastolic function is abnormal, indeterminate grade. Wall motion was normal; there were no regional wall motion abnormalities.  ------------------------------------------------------------------- Aortic valve:   Trileaflet; normal thickness leaflets.  Doppler: There was no stenosis.   There was no significant regurgitation. VTI ratio of LVOT to aortic valve: 0.77. Valve area (VTI): 1.96 cm^2. Indexed valve area (VTI): 1.07 cm^2/m^2. Peak velocity ratio of LVOT to aortic valve: 0.72. Valve area (Vmax): 1.82 cm^2. Indexed valve area (Vmax): 1 cm^2/m^2.    Mean gradient (S): 6 mm Hg. Peak gradient (S): 10 mm Hg.  ------------------------------------------------------------------- Aorta:  Aortic root: The aortic root was normal in size.  ------------------------------------------------------------------- Mitral valve:   Normal thickness leaflets .  Doppler:   There was no evidence for stenosis.   There was no significant regurgitation.    Peak gradient (D): 4 mm  Hg.  ------------------------------------------------------------------- Left atrium:  The atrium was normal in size.  ------------------------------------------------------------------- Atrial septum:  Poorly visualized.  ------------------------------------------------------------------- Right ventricle:  The cavity size was normal. Wall thickness was normal. Systolic function was normal.  ------------------------------------------------------------------- Pulmonic valve:   Not well visualized.  Doppler:   There was no evidence for stenosis.   There was no significant regurgitation.   ------------------------------------------------------------------- Tricuspid valve:   Normal thickness leaflets.  Doppler:   There was no evidence for stenosis.   There was no significant regurgitation.   ------------------------------------------------------------------- Right atrium:  The atrium was normal in size.  ------------------------------------------------------------------- Pericardium:  There was no pericardial effusion.  ------------------------------------------------------------------- Systemic veins: Inferior vena cava: The vessel was normal in size. The respirophasic diameter changes were in the normal range (>= 50%), consistent with normal central venous pressure.  ------------------------------------------------------------------- Measurements   Left ventricle                           Value           Reference  LV ID, ED, PLAX chordal          (L)     35.7   mm       43 - 52  LV ID, ES, PLAX chordal          (L)     21.8   mm       23 - 38  LV fx shortening, PLAX chordal           39     %        >=29  LV PW thickness, ED                      13     mm       ---------  IVS/LV PW ratio, ED                      0.98            <=1.3    Ventricular septum                       Value           Reference  IVS thickness, ED                        12.8   mm        ---------    LVOT                                     Value           Reference  LVOT ID, S                               18     mm       ---------  LVOT area                                2.54   cm^2     ---------  LVOT peak velocity, S                    115.05 cm/s     ---------  LVOT VTI, S                              32.15  cm       ---------  LVOT peak gradient, S                    5      mm Hg    ---------    Aortic valve                             Value           Reference  Aortic valve peak velocity, S            160.75 cm/s     ---------  Aortic valve mean velocity, S            109.71 cm/s     ---------  Aortic valve VTI,  S                      41.68  cm       ---------  Aortic mean gradient, S                  6      mm Hg    ---------  Aortic peak gradient, S                  10     mm Hg    ---------  VTI ratio, LVOT/AV                       0.77            ---------  Aortic valve area, VTI                   1.96   cm^2     ---------  Aortic valve area/bsa, VTI               1.07   cm^2/m^2 ---------  Velocity ratio, peak, LVOT/AV            0.72            ---------  Aortic valve area, peak velocity         1.82   cm^2     ---------  Aortic valve area/bsa, peak              1      cm^2/m^2 ---------  velocity    Aorta                                    Value           Reference  Aortic root ID, ED                       27     mm       ---------    Left atrium                              Value           Reference  LA ID, A-P, ES                           37     mm       ---------  LA volume/bsa, S                         21.5   ml/m^2   ---------    Mitral valve                             Value           Reference  Mitral E-wave peak velocity              103    cm/s     ---------  Mitral A-wave peak velocity              102    cm/s     ---------  Mitral deceleration time         (  H)     356    ms       150 - 230  Mitral peak gradient, D                  4       mm Hg    ---------  Mitral E/A ratio, peak                   1               ---------    Right ventricle                          Value           Reference  TAPSE                                    24.4   mm       ---------  RV s&', lateral, S                        13.2   cm/s     ---------  Legend: (L)  and  (H)  mark values outside specified reference range.  ------------------------------------------------------------------- Prepared and Electronically Authenticated by  Patrick Jupiter, M.D. 2018-03-30T10:51:54 PACS Images   Show images for ECHOCARDIOGRAM COMPLETE Patient Information   Patient Name Emmalise, Huard Sex Female DOB October 10, 1940 SSN [id number] Reason For Exam  Priority: STAT  Not on file Surgical History   Surgical History    No past medical history on file.  Other Surgical History    Procedure Laterality Date Comment Source ABDOMINAL HYSTERECTOMY    Provider APPENDECTOMY    Provider CHOLECYSTECTOMY    Provider HERNIA REPAIR    Provider LEFT HEART CATH AND CORONARY ANGIOGRAPHY N/A 04/01/2016 Procedure: Left Heart Cath and Coronary Angiography; Surgeon: Peter M [place], MD; Location: [facility] INVASIVE CV LAB; Service: Cardiovascular; Laterality: N/A; Provider LUMBAR SPINE SURGERY    Provider right shoulder surgery    Provider tah and bso    Provider  Patient Data   Height  60 in  BP  113/68 mmHg    Performing Technologist/Nurse   Performing Technologist/Nurse: Katheren Puller EF and Strain Measurements    Ejection Fraction TDI e' lateral  5.22   TDI e' medial  6.09      2D Measurements    LV PW d  13 mm (A) (Range: 0.6 - 1.1)  FS  39 % (Range: 28 - 44)  LA ID, A-P, ES  37 mm  LA diam end sys  37 mm  LA vol  39.4 mL  LA vol index  22.8 mL/m2  IVS/LV PW RATIO, ED  0.98   FS  39 % (Range: 28 - 44)    Right Ventricle Measurements    Lateral S' vel  13.2 cm/sec  TAPSE  24.4 mm      Left  Atrium Measurements    LA ID, A-P, ES  37 mm  LA vol  39.4 mL  LA vol index  22.8 mL/m2  LA vol A4C  35.3 ml    Mitral Valve Measurements    Stenosis Peak grad  4 mmHg  MV pk A vel  102 m/s    Regurgitation MV Dec  356   E decel time  356  msec    PISA-MS/PISA-MR MV pk E vel  103 m/s       Implants     No active implants to display in this view. Order-Level Documents - 04/01/2016:   Scan on 04/02/2016 3:19 PM by Default, Provider, MDScan on 04/02/2016 3:19 PM by Default, Provider, MD    Encounter-Level Documents - 04/01/2016:   Scan on 04/06/2016 3:15 PM by Default, Provider, MDScan on 04/06/2016 3:15 PM by Default, Provider, MD  Scan on 04/05/2016 4:48 PM by Default, Provider, MDScan on 04/05/2016 4:48 PM by Default, Provider, MD  Document on 04/05/2016 1:42 PM by Alda Berthold, RN : ED PB Summary  Document on 04/05/2016 1:42 PM by Alda Berthold, RN : ED Encounter Summary  Scan on 04/04/2016 12:02 PM by Default, Provider, MDScan on 04/04/2016 12:02 PM by Default, Provider, MD  Scan on 04/04/2016 11:52 AM by Default, Provider, MDScan on 04/04/2016 11:52 AM by Default, Provider, MD  Document on 04/02/2016 1:35 PM by Donell Beers, RN : IP After Visit Summary  Scan on 04/02/2016 9:05 AM by Default, Provider, MDScan on 04/02/2016 9:05 AM by Default, Provider, MD  Scan on 04/01/2016 3:39 PM by Default, Provider, MDScan on 04/01/2016 3:39 PM by Default, Provider, MD  Scan on 04/01/2016 2:12 PM by Default, Provider, MDScan on 04/01/2016 2:12 PM by Default, Provider, MD  Electronic signature on 04/01/2016 12:06 PM  Scan on 04/01/2016 11:14 AM by Default, Provider, MDScan on 04/01/2016 11:14 AM by Default, Provider, MD  Electronic signature on 04/01/2016 5:48 AM  Electronic signature on 04/01/2016 5:47 AM    Signed   Electronically signed by Antoine Poche, MD on 04/01/16 at 1051 EDT Printable Result Report    Result Report  External Result Report    External  Result Report     Reproductive/Obstetrics                            Anesthesia Physical Anesthesia Plan  ASA: II  Anesthesia Plan: General   Post-op Pain Management:    Induction: Intravenous  PONV Risk Score and Plan: 4 or greater and Ondansetron, Dexamethasone, Midazolam, Scopolamine patch - Pre-op and Propofol infusion  Airway Management Planned: Oral ETT  Additional Equipment:   Intra-op Plan:   Post-operative Plan: Extubation in OR  Informed Consent: I have reviewed the patients History and Physical, chart, labs and discussed the procedure including the risks, benefits and alternatives for the proposed anesthesia with the patient or authorized representative who has indicated his/her understanding and acceptance.   Dental advisory given  Plan Discussed with: CRNA and Surgeon  Anesthesia Plan Comments:        Anesthesia Quick Evaluation

## 2016-08-25 ENCOUNTER — Encounter (HOSPITAL_COMMUNITY)
Admission: RE | Disposition: A | Discharge status: Home / Self Care | Payer: Self-pay | Source: Ambulatory Visit | Attending: Neurological Surgery

## 2016-08-25 ENCOUNTER — Inpatient Hospital Stay (HOSPITAL_COMMUNITY): Payer: PPO

## 2016-08-25 ENCOUNTER — Inpatient Hospital Stay (HOSPITAL_COMMUNITY)
Admission: RE | Admit: 2016-08-25 | Discharge: 2016-08-26 | DRG: 460 | Disposition: A | Discharge status: Home / Self Care | Payer: PPO | Source: Ambulatory Visit | Attending: Neurological Surgery | Admitting: Neurological Surgery

## 2016-08-25 ENCOUNTER — Inpatient Hospital Stay (HOSPITAL_COMMUNITY): Payer: PPO | Admitting: Anesthesiology

## 2016-08-25 ENCOUNTER — Encounter (HOSPITAL_COMMUNITY): Payer: Self-pay

## 2016-08-25 ENCOUNTER — Inpatient Hospital Stay (HOSPITAL_COMMUNITY): Payer: PPO | Admitting: Vascular Surgery

## 2016-08-25 DIAGNOSIS — E669 Obesity, unspecified: Secondary | ICD-10-CM | POA: Diagnosis present

## 2016-08-25 DIAGNOSIS — Z79891 Long term (current) use of opiate analgesic: Secondary | ICD-10-CM | POA: Diagnosis not present

## 2016-08-25 DIAGNOSIS — Z886 Allergy status to analgesic agent status: Secondary | ICD-10-CM | POA: Diagnosis not present

## 2016-08-25 DIAGNOSIS — Z981 Arthrodesis status: Secondary | ICD-10-CM

## 2016-08-25 DIAGNOSIS — Z9049 Acquired absence of other specified parts of digestive tract: Secondary | ICD-10-CM

## 2016-08-25 DIAGNOSIS — I119 Hypertensive heart disease without heart failure: Secondary | ICD-10-CM | POA: Diagnosis present

## 2016-08-25 DIAGNOSIS — Z7982 Long term (current) use of aspirin: Secondary | ICD-10-CM

## 2016-08-25 DIAGNOSIS — Z8261 Family history of arthritis: Secondary | ICD-10-CM

## 2016-08-25 DIAGNOSIS — Z6832 Body mass index (BMI) 32.0-32.9, adult: Secondary | ICD-10-CM | POA: Diagnosis not present

## 2016-08-25 DIAGNOSIS — Z79899 Other long term (current) drug therapy: Secondary | ICD-10-CM | POA: Diagnosis not present

## 2016-08-25 DIAGNOSIS — Z885 Allergy status to narcotic agent status: Secondary | ICD-10-CM

## 2016-08-25 DIAGNOSIS — K589 Irritable bowel syndrome without diarrhea: Secondary | ICD-10-CM | POA: Diagnosis present

## 2016-08-25 DIAGNOSIS — Z90710 Acquired absence of both cervix and uterus: Secondary | ICD-10-CM | POA: Diagnosis not present

## 2016-08-25 DIAGNOSIS — Z8673 Personal history of transient ischemic attack (TIA), and cerebral infarction without residual deficits: Secondary | ICD-10-CM

## 2016-08-25 DIAGNOSIS — M4316 Spondylolisthesis, lumbar region: Principal | ICD-10-CM | POA: Diagnosis present

## 2016-08-25 DIAGNOSIS — Z7902 Long term (current) use of antithrombotics/antiplatelets: Secondary | ICD-10-CM | POA: Diagnosis not present

## 2016-08-25 DIAGNOSIS — Z8249 Family history of ischemic heart disease and other diseases of the circulatory system: Secondary | ICD-10-CM | POA: Diagnosis not present

## 2016-08-25 DIAGNOSIS — M47816 Spondylosis without myelopathy or radiculopathy, lumbar region: Secondary | ICD-10-CM | POA: Diagnosis present

## 2016-08-25 DIAGNOSIS — K219 Gastro-esophageal reflux disease without esophagitis: Secondary | ICD-10-CM | POA: Diagnosis present

## 2016-08-25 DIAGNOSIS — E785 Hyperlipidemia, unspecified: Secondary | ICD-10-CM | POA: Diagnosis present

## 2016-08-25 DIAGNOSIS — Z4789 Encounter for other orthopedic aftercare: Secondary | ICD-10-CM | POA: Diagnosis not present

## 2016-08-25 HISTORY — DX: Spondylolisthesis, lumbar region: M43.16

## 2016-08-25 SURGERY — POSTERIOR LUMBAR FUSION 1 LEVEL
Anesthesia: General | Site: Back

## 2016-08-25 MED ORDER — DEXTROSE 5 % IV SOLN: 500.0000 mg | Freq: Four times a day (QID) | INTRAVENOUS | Status: DC | PRN

## 2016-08-25 MED ORDER — VANCOMYCIN HCL 1000 MG IV SOLR
INTRAVENOUS | Status: DC | PRN
  Administered 2016-08-25: 1000 mg via TOPICAL

## 2016-08-25 MED ORDER — ACETAMINOPHEN 10 MG/ML IV SOLN: 1000.0000 mg | Freq: Once | INTRAVENOUS | Status: DC | PRN

## 2016-08-25 MED ORDER — HYDROMORPHONE HCL 1 MG/ML IJ SOLN
INTRAMUSCULAR | Status: AC
  Filled 2016-08-25: qty 1

## 2016-08-25 MED ORDER — SODIUM CHLORIDE 0.9% FLUSH
3.0000 mL | Freq: Two times a day (BID) | INTRAVENOUS | Status: DC
  Administered 2016-08-25 (×2): 3 mL via INTRAVENOUS

## 2016-08-25 MED ORDER — ONDANSETRON HCL 4 MG PO TABS: 4.0000 mg | ORAL_TABLET | Freq: Four times a day (QID) | ORAL | Status: DC | PRN

## 2016-08-25 MED ORDER — MIDAZOLAM HCL 2 MG/2ML IJ SOLN
INTRAMUSCULAR | Status: AC
  Filled 2016-08-25: qty 2

## 2016-08-25 MED ORDER — DULOXETINE HCL 30 MG PO CPEP
30.0000 mg | ORAL_CAPSULE | Freq: Every day | ORAL | Status: DC
  Administered 2016-08-25: 30 mg via ORAL
  Filled 2016-08-25: qty 1

## 2016-08-25 MED ORDER — HYDROXYZINE HCL 50 MG/ML IM SOLN
50.0000 mg | Freq: Four times a day (QID) | INTRAMUSCULAR | Status: DC | PRN
  Administered 2016-08-25: 50 mg via INTRAMUSCULAR
  Filled 2016-08-25: qty 1

## 2016-08-25 MED ORDER — ASPIRIN EC 81 MG PO TBEC
81.0000 mg | DELAYED_RELEASE_TABLET | Freq: Every day | ORAL | Status: DC
  Administered 2016-08-26: 81 mg via ORAL
  Filled 2016-08-25 (×2): qty 1

## 2016-08-25 MED ORDER — NEOSTIGMINE METHYLSULFATE 10 MG/10ML IV SOLN
INTRAVENOUS | Status: DC | PRN
  Administered 2016-08-25: 3 mg via INTRAVENOUS

## 2016-08-25 MED ORDER — TEMAZEPAM 15 MG PO CAPS
30.0000 mg | ORAL_CAPSULE | Freq: Every evening | ORAL | Status: DC | PRN
Start: 2016-08-25 — End: 2016-08-26

## 2016-08-25 MED ORDER — ONDANSETRON HCL 4 MG/2ML IJ SOLN
INTRAMUSCULAR | Status: DC | PRN
  Administered 2016-08-25: 4 mg via INTRAVENOUS

## 2016-08-25 MED ORDER — THROMBIN 20000 UNITS EX SOLR
CUTANEOUS | Status: DC | PRN
  Administered 2016-08-25: 09:00:00 via TOPICAL

## 2016-08-25 MED ORDER — CEFAZOLIN SODIUM-DEXTROSE 2-4 GM/100ML-% IV SOLN
2.0000 g | Freq: Three times a day (TID) | INTRAVENOUS | Status: AC
  Administered 2016-08-25 (×2): 2 g via INTRAVENOUS
  Filled 2016-08-25 (×2): qty 100

## 2016-08-25 MED ORDER — PROPOFOL 10 MG/ML IV BOLUS
INTRAVENOUS | Status: DC | PRN
  Administered 2016-08-25: 150 mg via INTRAVENOUS

## 2016-08-25 MED ORDER — BUPIVACAINE HCL (PF) 0.5 % IJ SOLN
INTRAMUSCULAR | Status: DC | PRN
  Administered 2016-08-25: 10 mL

## 2016-08-25 MED ORDER — MENTHOL 3 MG MT LOZG: 1.0000 | LOZENGE | OROMUCOSAL | Status: DC | PRN

## 2016-08-25 MED ORDER — SENNA 8.6 MG PO TABS
1.0000 | ORAL_TABLET | Freq: Two times a day (BID) | ORAL | Status: DC
  Administered 2016-08-25 – 2016-08-26 (×2): 8.6 mg via ORAL
  Filled 2016-08-25 (×2): qty 1

## 2016-08-25 MED ORDER — PROMETHAZINE HCL 25 MG/ML IJ SOLN: 6.2500 mg | INTRAMUSCULAR | Status: DC | PRN

## 2016-08-25 MED ORDER — FENTANYL CITRATE (PF) 250 MCG/5ML IJ SOLN
INTRAMUSCULAR | Status: AC
  Filled 2016-08-25: qty 5

## 2016-08-25 MED ORDER — MORPHINE SULFATE (PF) 4 MG/ML IV SOLN
2.0000 mg | INTRAVENOUS | Status: DC | PRN
  Administered 2016-08-25: 2 mg via INTRAVENOUS
  Filled 2016-08-25: qty 1

## 2016-08-25 MED ORDER — ONDANSETRON HCL 4 MG/2ML IJ SOLN: 4.0000 mg | Freq: Four times a day (QID) | INTRAMUSCULAR | Status: DC | PRN

## 2016-08-25 MED ORDER — METHOCARBAMOL 500 MG PO TABS
500.0000 mg | ORAL_TABLET | Freq: Four times a day (QID) | ORAL | Status: DC | PRN
  Administered 2016-08-25 – 2016-08-26 (×3): 500 mg via ORAL
  Filled 2016-08-25 (×3): qty 1

## 2016-08-25 MED ORDER — GLYCOPYRROLATE 0.2 MG/ML IJ SOLN
INTRAMUSCULAR | Status: DC | PRN
  Administered 2016-08-25: 0.4 mg via INTRAVENOUS

## 2016-08-25 MED ORDER — SODIUM CHLORIDE 0.9% FLUSH: 3.0000 mL | INTRAVENOUS | Status: DC | PRN

## 2016-08-25 MED ORDER — ACETAMINOPHEN 650 MG RE SUPP
650.0000 mg | RECTAL | Status: DC | PRN
Start: 2016-08-25 — End: 2016-08-26

## 2016-08-25 MED ORDER — LACTATED RINGERS IV SOLN
INTRAVENOUS | Status: DC | PRN
  Administered 2016-08-25 (×2): via INTRAVENOUS

## 2016-08-25 MED ORDER — THROMBIN 5000 UNITS EX SOLR
CUTANEOUS | Status: AC
  Filled 2016-08-25: qty 5000

## 2016-08-25 MED ORDER — VANCOMYCIN HCL 1000 MG IV SOLR
INTRAVENOUS | Status: AC
  Filled 2016-08-25: qty 1000

## 2016-08-25 MED ORDER — BUPIVACAINE HCL (PF) 0.5 % IJ SOLN
INTRAMUSCULAR | Status: AC
  Filled 2016-08-25: qty 30

## 2016-08-25 MED ORDER — FENTANYL CITRATE (PF) 100 MCG/2ML IJ SOLN
INTRAMUSCULAR | Status: DC | PRN
  Administered 2016-08-25: 50 ug via INTRAVENOUS
  Administered 2016-08-25: 150 ug via INTRAVENOUS
  Administered 2016-08-25: 50 ug via INTRAVENOUS

## 2016-08-25 MED ORDER — THROMBIN 20000 UNITS EX SOLR
CUTANEOUS | Status: AC
  Filled 2016-08-25: qty 20000

## 2016-08-25 MED ORDER — SODIUM CHLORIDE 0.9 % IR SOLN
Status: DC | PRN
  Administered 2016-08-25: 09:00:00

## 2016-08-25 MED ORDER — EPHEDRINE SULFATE 50 MG/ML IJ SOLN
INTRAMUSCULAR | Status: DC | PRN
  Administered 2016-08-25 (×3): 10 mg via INTRAVENOUS

## 2016-08-25 MED ORDER — LIDOCAINE HCL (CARDIAC) 20 MG/ML IV SOLN
INTRAVENOUS | Status: DC | PRN
  Administered 2016-08-25: 100 mg via INTRAVENOUS

## 2016-08-25 MED ORDER — MIDAZOLAM HCL 5 MG/5ML IJ SOLN
INTRAMUSCULAR | Status: DC | PRN
  Administered 2016-08-25: 2 mg via INTRAVENOUS

## 2016-08-25 MED ORDER — CHLORHEXIDINE GLUCONATE CLOTH 2 % EX PADS: 6.0000 | MEDICATED_PAD | Freq: Once | CUTANEOUS | Status: DC

## 2016-08-25 MED ORDER — ROCURONIUM BROMIDE 100 MG/10ML IV SOLN
INTRAVENOUS | Status: DC | PRN
  Administered 2016-08-25: 50 mg via INTRAVENOUS

## 2016-08-25 MED ORDER — PREGABALIN 75 MG PO CAPS
75.0000 mg | ORAL_CAPSULE | Freq: Every day | ORAL | Status: DC
  Administered 2016-08-25: 75 mg via ORAL
  Filled 2016-08-25: qty 1

## 2016-08-25 MED ORDER — MEPERIDINE HCL 25 MG/ML IJ SOLN: 6.2500 mg | INTRAMUSCULAR | Status: DC | PRN

## 2016-08-25 MED ORDER — POTASSIUM CHLORIDE IN NACL 20-0.9 MEQ/L-% IV SOLN
INTRAVENOUS | Status: DC
  Filled 2016-08-25: qty 1000

## 2016-08-25 MED ORDER — ACETAMINOPHEN 325 MG PO TABS: 650.0000 mg | ORAL_TABLET | ORAL | Status: DC | PRN

## 2016-08-25 MED ORDER — THROMBIN 5000 UNITS EX SOLR
OROMUCOSAL | Status: DC | PRN
  Administered 2016-08-25: 09:00:00 via TOPICAL

## 2016-08-25 MED ORDER — HYDROMORPHONE HCL 1 MG/ML IJ SOLN
0.2500 mg | INTRAMUSCULAR | Status: DC | PRN
  Administered 2016-08-25 (×4): 0.25 mg via INTRAVENOUS

## 2016-08-25 MED ORDER — DEXAMETHASONE SODIUM PHOSPHATE 10 MG/ML IJ SOLN
10.0000 mg | INTRAMUSCULAR | Status: AC
  Administered 2016-08-25: 10 mg via INTRAVENOUS

## 2016-08-25 MED ORDER — OXYCODONE HCL 5 MG PO TABS
5.0000 mg | ORAL_TABLET | ORAL | Status: DC | PRN
  Administered 2016-08-25: 10 mg via ORAL
  Administered 2016-08-25 – 2016-08-26 (×2): 5 mg via ORAL
  Administered 2016-08-26 (×2): 10 mg via ORAL
  Filled 2016-08-25 (×5): qty 2

## 2016-08-25 MED ORDER — PROPOFOL 10 MG/ML IV BOLUS
INTRAVENOUS | Status: AC
  Filled 2016-08-25: qty 20

## 2016-08-25 MED ORDER — PHENOL 1.4 % MT LIQD: 1.0000 | OROMUCOSAL | Status: DC | PRN

## 2016-08-25 SURGICAL SUPPLY — 57 items
BAG DECANTER FOR FLEXI CONT (MISCELLANEOUS) ×3 IMPLANT
BASKET BONE COLLECTION (BASKET) ×3 IMPLANT
BENZOIN TINCTURE PRP APPL 2/3 (GAUZE/BANDAGES/DRESSINGS) ×3 IMPLANT
BLADE CLIPPER SURG (BLADE) IMPLANT
BONE CANC CHIPS 20CC PCAN1/4 (Bone Implant) ×3 IMPLANT
BUR MATCHSTICK NEURO 3.0 LAGG (BURR) ×3 IMPLANT
CANISTER SUCT 3000ML PPV (MISCELLANEOUS) IMPLANT
CARTRIDGE OIL MAESTRO DRILL (MISCELLANEOUS) ×1 IMPLANT
CHIPS CANC BONE 20CC PCAN1/4 (Bone Implant) ×1 IMPLANT
CLOSURE WOUND 1/2 X4 (GAUZE/BANDAGES/DRESSINGS) ×1
CONT SPEC 4OZ CLIKSEAL STRL BL (MISCELLANEOUS) ×3 IMPLANT
COVER BACK TABLE 60X90IN (DRAPES) ×3 IMPLANT
DIFFUSER DRILL AIR PNEUMATIC (MISCELLANEOUS) ×3 IMPLANT
DRAPE C-ARM 42X72 X-RAY (DRAPES) ×3 IMPLANT
DRAPE C-ARMOR (DRAPES) ×3 IMPLANT
DRAPE LAPAROTOMY 100X72X124 (DRAPES) ×3 IMPLANT
DRAPE POUCH INSTRU U-SHP 10X18 (DRAPES) ×3 IMPLANT
DRAPE SURG 17X23 STRL (DRAPES) ×3 IMPLANT
DRSG OPSITE POSTOP 4X6 (GAUZE/BANDAGES/DRESSINGS) ×3 IMPLANT
DURAPREP 26ML APPLICATOR (WOUND CARE) ×3 IMPLANT
ELECT REM PT RETURN 9FT ADLT (ELECTROSURGICAL) ×3
ELECTRODE REM PT RTRN 9FT ADLT (ELECTROSURGICAL) ×1 IMPLANT
EVACUATOR 1/8 PVC DRAIN (DRAIN) ×3 IMPLANT
GAUZE SPONGE 4X4 16PLY XRAY LF (GAUZE/BANDAGES/DRESSINGS) IMPLANT
GLOVE BIO SURGEON STRL SZ7 (GLOVE) IMPLANT
GLOVE BIO SURGEON STRL SZ8 (GLOVE) ×6 IMPLANT
GLOVE BIOGEL PI IND STRL 7.0 (GLOVE) IMPLANT
GLOVE BIOGEL PI INDICATOR 7.0 (GLOVE)
GOWN STRL REUS W/ TWL LRG LVL3 (GOWN DISPOSABLE) IMPLANT
GOWN STRL REUS W/ TWL XL LVL3 (GOWN DISPOSABLE) ×2 IMPLANT
GOWN STRL REUS W/TWL 2XL LVL3 (GOWN DISPOSABLE) IMPLANT
GOWN STRL REUS W/TWL LRG LVL3 (GOWN DISPOSABLE)
GOWN STRL REUS W/TWL XL LVL3 (GOWN DISPOSABLE) ×4
HEMOSTAT POWDER KIT SURGIFOAM (HEMOSTASIS) ×3 IMPLANT
KIT BASIN OR (CUSTOM PROCEDURE TRAY) ×3 IMPLANT
KIT ROOM TURNOVER OR (KITS) ×3 IMPLANT
NEEDLE ASP BONE MRW 8GX15 (NEEDLE) ×3 IMPLANT
NEEDLE HYPO 25X1 1.5 SAFETY (NEEDLE) ×3 IMPLANT
NS IRRIG 1000ML POUR BTL (IV SOLUTION) ×3 IMPLANT
OIL CARTRIDGE MAESTRO DRILL (MISCELLANEOUS) ×3
PACK LAMINECTOMY NEURO (CUSTOM PROCEDURE TRAY) ×3 IMPLANT
PAD ARMBOARD 7.5X6 YLW CONV (MISCELLANEOUS) ×9 IMPLANT
ROD PC 5.5X40 TI ARSENAL (Rod) ×6 IMPLANT
SCREW CBX 5.0X35MM (Screw) ×12 IMPLANT
SCREW SET SPINAL ARSENAL 47127 (Screw) ×12 IMPLANT
SPONGE LAP 4X18 X RAY DECT (DISPOSABLE) IMPLANT
SPONGE SURGIFOAM ABS GEL 100 (HEMOSTASIS) ×3 IMPLANT
STRIP BIOACTIVE 10CC 25X100X4 (Miscellaneous) ×3 IMPLANT
STRIP CLOSURE SKIN 1/2X4 (GAUZE/BANDAGES/DRESSINGS) ×2 IMPLANT
SUT VIC AB 0 CT1 18XCR BRD8 (SUTURE) ×1 IMPLANT
SUT VIC AB 0 CT1 8-18 (SUTURE) ×2
SUT VIC AB 2-0 CP2 18 (SUTURE) ×3 IMPLANT
SUT VIC AB 3-0 SH 8-18 (SUTURE) ×6 IMPLANT
TOWEL GREEN STERILE (TOWEL DISPOSABLE) ×3 IMPLANT
TOWEL GREEN STERILE FF (TOWEL DISPOSABLE) ×3 IMPLANT
TRAY FOLEY W/METER SILVER 16FR (SET/KITS/TRAYS/PACK) ×3 IMPLANT
WATER STERILE IRR 1000ML POUR (IV SOLUTION) ×3 IMPLANT

## 2016-08-25 NOTE — Transfer of Care (Signed)
Immediate Anesthesia Transfer of Care Note  Patient: Tamara Roberts  Procedure(s) Performed: Procedure(s): PLIF - L4-L5 (N/A)  Patient Location: PACU  Anesthesia Type:General  Level of Consciousness: awake, alert , oriented and patient cooperative  Airway & Oxygen Therapy: Patient Spontanous Breathing and Patient connected to nasal cannula oxygen  Post-op Assessment: Report given to RN and Post -op Vital signs reviewed and stable  Post vital signs: Reviewed and stable  Last Vitals:  Vitals:   08/25/16 0539 08/25/16 1013  BP: (!) 166/84   Pulse: 71   Resp: 18   Temp: 37.1 C (P) 36.4 C  SpO2: 99%     Last Pain:  Vitals:   08/25/16 0608  TempSrc:   PainSc: 4       Patients Stated Pain Goal: 2 (08/25/16 7371)  Complications: No apparent anesthesia complications

## 2016-08-25 NOTE — H&P (Signed)
Subjective: Patient is a 76 y.o. female admitted for PLIF. Onset of symptoms was several months ago, gradually worsening since that time.  The pain is rated severe, unremitting, and is located at the across the lower back and radiates to legs. The pain is described as burning and occurs all day. The symptoms have been progressive. Symptoms are exacerbated by exercise. MRI or CT showed spondylolisthesis L4-5   Past Medical History:  Diagnosis Date  . Bursitis    in shoulders and hips  . Complication of anesthesia    longer to wake up, been fine since no problems since  . Diverticulitis   . DJD (degenerative joint disease), lumbar   . GERD (gastroesophageal reflux disease)   . HTN (hypertension)   . Hyperlipidemia   . Irritable bowel syndrome   . Spondylolisthesis at L4-L5 level   . Stroke [facility])     Past Surgical History:  Procedure Laterality Date  . ABDOMINAL HYSTERECTOMY    . APPENDECTOMY    . CHOLECYSTECTOMY    . HERNIA REPAIR    . LEFT HEART CATH AND CORONARY ANGIOGRAPHY N/A 04/01/2016   Procedure: Left Heart Cath and Coronary Angiography;  Surgeon: Peter M Swaziland, MD;  Location: [facility] INVASIVE CV LAB;  Service: Cardiovascular;  Laterality: N/A;  . LUMBAR SPINE SURGERY    . right shoulder surgery    . tah and bso      Prior to Admission medications   Medication Sig Start Date End Date Taking? Authorizing Provider  aspirin EC 81 MG tablet Take 81 mg by mouth daily.   Yes [provider]  atorvastatin (LIPITOR) 40 MG tablet Take 1 tablet (40 mg total) by mouth every evening. 04/02/16  Yes Dunn, Dayna N, PA-C  chlorpheniramine (CHLOR-TRIMETON) 4 MG tablet Take 4 mg by mouth daily as needed for allergies.   Yes [provider]  cholecalciferol (VITAMIN D) 1000 units tablet Take 1,000 Units by mouth daily.   Yes [provider]  clopidogrel (PLAVIX) 75 MG tablet Take 75 mg by mouth daily.   Yes [provider]  DULoxetine (CYMBALTA) 30 MG capsule  Take 30 mg by mouth at bedtime.   Yes [provider]  fluticasone (FLONASE) 50 MCG/ACT nasal spray Place 1 spray into both nostrils daily as needed for allergies or rhinitis.   Yes [provider]  Naphazoline-Pheniramine (OPCON-A OP) Apply 1 drop to eye 2 (two) times daily as needed (dry eyes).   Yes [provider]  pregabalin (LYRICA) 75 MG capsule Take 75 mg by mouth at bedtime.   Yes [provider]  temazepam (RESTORIL) 30 MG capsule Take 30 mg by mouth at bedtime as needed for sleep.   Yes [provider]  traMADol (ULTRAM) 50 MG tablet Take 50 mg by mouth 3 (three) times daily as needed for moderate pain.   Yes [provider]   Allergies  Allergen Reactions  . Feldene [Piroxicam] Shortness Of Breath  . Codeine Nausea Only    Social History  Substance Use Topics  . Smoking status: Never Smoker  . Smokeless tobacco: Never Used  . Alcohol use No    Family History  Problem Relation Age of Onset  . Cancer Mother   . Diabetes Paternal Grandmother   . Heart attack Maternal Grandfather   . Heart disease Father   . Arthritis Father        crippling  . Alcoholism Brother   . Drug abuse Brother   . Congestive  Heart Failure Daughter      Review of Systems  Positive ROS: neg  All other systems have been reviewed and were otherwise negative with the exception of those mentioned in the HPI and as above.  Objective: Vital signs in last 24 hours: Temp:  [98.7 F (37.1 C)] 98.7 F (37.1 C) (08/23 0539) Pulse Rate:  [71] 71 (08/23 0539) Resp:  [18] 18 (08/23 0539) BP: (166)/(84) 166/84 (08/23 0539) SpO2:  [99 %] 99 % (08/23 0539) Weight:  [76.7 kg (169 lb)] 76.7 kg (169 lb) (08/23 3570)  General Appearance: Alert, cooperative, no distress, appears stated age Head: Normocephalic, without obvious abnormality, atraumatic Eyes: PERRL, conjunctiva/corneas clear, EOM's intact    Neck: Supple, symmetrical, trachea midline Back:  Symmetric, no curvature, ROM normal, no CVA tenderness Lungs:  respirations unlabored Heart: Regular rate and rhythm Abdomen: Soft, non-tender Extremities: Extremities normal, atraumatic, no cyanosis or edema Pulses: 2+ and symmetric all extremities Skin: Skin color, texture, turgor normal, no rashes or lesions  NEUROLOGIC:   Mental status: Alert and oriented x4,  no aphasia, good attention span, fund of knowledge, and memory Motor Exam - grossly normal Sensory Exam - grossly normal Reflexes: 1+ Coordination - grossly normal Gait - grossly normal Balance - grossly normal Cranial Nerves: I: smell Not tested  II: visual acuity  OS: nl    OD: nl  II: visual fields Full to confrontation  II: pupils Equal, round, reactive to light  III,VII: ptosis None  III,IV,VI: extraocular muscles  Full ROM  V: mastication Normal  V: facial light touch sensation  Normal  V,VII: corneal reflex  Present  VII: facial muscle function - upper  Normal  VII: facial muscle function - lower Normal  VIII: hearing Not tested  IX: soft palate elevation  Normal  IX,X: gag reflex Present  XI: trapezius strength  5/5  XI: sternocleidomastoid strength 5/5  XI: neck flexion strength  5/5  XII: tongue strength  Normal    Data Review Lab Results  Component Value Date   WBC 8.6 08/23/2016   HGB 14.9 08/23/2016   HCT 45.1 08/23/2016   MCV 89.3 08/23/2016   PLT 271 08/23/2016   Lab Results  Component Value Date   NA 140 08/23/2016   K 3.8 08/23/2016   CL 108 08/23/2016   CO2 22 08/23/2016   BUN 18 08/23/2016   CREATININE 1.31 (H) 08/23/2016   GLUCOSE 98 08/23/2016   Lab Results  Component Value Date   INR 0.97 08/23/2016    Assessment/Plan: Patient admitted for PLIF L4--5. Patient has failed a reasonable attempt at conservative therapy.  I explained the condition and procedure to the patient and answered any questions.  Patient wishes to proceed with procedure as planned. Understands risks/  benefits and typical outcomes of procedure.   Sinead Hockman S 08/25/2016 6:25 AM

## 2016-08-25 NOTE — Evaluation (Signed)
Physical Therapy Evaluation Patient Details Name: Tamara Roberts MRN: [id number] DOB: Mar 26, 1940 Today's Date: 08/25/2016   History of Present Illness  Patient is a 76 y/o female s/p L4-5 PLIF. PMH includes stroke, HTN, HLD.  Clinical Impression  Patient presents with pain, nausea and post surgical deficits s/p above surgery. Pt with + emesis upon standing. Tolerated gait training with Min A for balance/safety. Recommend using RW next session for support. Pt independent PTA and lives with spouse. Will have support of daughter at d/c. Mainly limited by pain and nausea. Education re: back precautions, positioning, log roll technique, brace etc. Will follow acutely to maximize independence and mobility prior to return home.     Follow Up Recommendations Home health PT;Supervision for mobility/OOB    Equipment Recommendations  None recommended by PT    Recommendations for Other Services       Precautions / Restrictions Precautions Precautions: Back Precaution Booklet Issued: Yes (comment) Precaution Comments: Reviewed handout and precautions Required Braces or Orthoses: Spinal Brace Spinal Brace: Applied in sitting position;Lumbar corset Restrictions Weight Bearing Restrictions: No      Mobility  Bed Mobility Overal bed mobility: Needs Assistance Bed Mobility: Rolling;Sidelying to Sit;Sit to Sidelying Rolling: Min guard Sidelying to sit: Min assist;HOB elevated     Sit to sidelying: Min guard;HOB elevated General bed mobility comments: Cues for log roll technique; use of rail for support. Min A to elevate trunk to get to EOB.  Transfers Overall transfer level: Needs assistance Equipment used: 1 person hand held assist Transfers: Sit to/from Stand Sit to Stand: Min assist         General transfer comment: Assist to power to standing. Stood from [employer], from toilet x1.   Ambulation/Gait Ambulation/Gait assistance: Min assist Ambulation Distance (Feet): 15 Feet (x2  bouts) Assistive device: 1 person hand held assist Gait Pattern/deviations: Step-to pattern;Step-through pattern;Decreased stride length Gait velocity: decreased Gait velocity interpretation: <1.8 ft/sec, indicative of risk for recurrent falls General Gait Details: Slow, guarded gait with handheld assist. Min A for balance. + emesis upon standing first time.   Stairs            Wheelchair Mobility    Modified Rankin (Stroke Patients Only)       Balance Overall balance assessment: Needs assistance Sitting-balance support: Feet supported;No upper extremity supported Sitting balance-Leahy Scale: Good     Standing balance support: During functional activity Standing balance-Leahy Scale: Fair Standing balance comment: Able to stand at sink and wash hands without difficulty.                              Pertinent Vitals/Pain Pain Assessment: Faces Faces Pain Scale: Hurts whole lot Pain Location: back Pain Descriptors / Indicators: Sore;Operative site guarding Pain Intervention(s): Monitored during session;Repositioned;Premedicated before session;Limited activity within patient's tolerance    Home Living Family/patient expects to be discharged to:: Private residence Living Arrangements: Spouse/significant other Available Help at Discharge: Family;Available 24 hours/day Type of Home: House Home Access: Ramped entrance     Home Layout: One level Home Equipment: Walker - 2 wheels;Cane - single point      Prior Function Level of Independence: Independent         Comments: Drives, cooks, cleans.     Hand Dominance        Extremity/Trunk Assessment   Upper Extremity Assessment Upper Extremity Assessment: Defer to OT evaluation    Lower Extremity Assessment Lower Extremity Assessment:  Generalized weakness    Cervical / Trunk Assessment Cervical / Trunk Assessment: Other exceptions Cervical / Trunk Exceptions: s/p spine surgery  Communication    Communication: No difficulties  Cognition Arousal/Alertness: Awake/alert Behavior During Therapy: WFL for tasks assessed/performed Overall Cognitive Status: Within Functional Limits for tasks assessed                                        General Comments General comments (skin integrity, edema, etc.): Family present during session.     Exercises     Assessment/Plan    PT Assessment Patient needs continued PT services  PT Problem List Decreased strength;Decreased mobility;Pain;Decreased balance;Decreased activity tolerance;Decreased knowledge of precautions       PT Treatment Interventions Therapeutic activities;Gait training;Therapeutic exercise;Patient/family education;Balance training;Functional mobility training;DME instruction    PT Goals (Current goals can be found in the Care Plan section)  Acute Rehab PT Goals Patient Stated Goal: to return to PLOF PT Goal Formulation: With patient Time For Goal Achievement: 09/08/16 Potential to Achieve Goals: Good    Frequency Min 5X/week   Barriers to discharge Decreased caregiver support      Co-evaluation               AM-PAC PT "6 Clicks" Daily Activity  Outcome Measure Difficulty turning over in bed (including adjusting bedclothes, sheets and blankets)?: None Difficulty moving from lying on back to sitting on the side of the bed? : Unable Difficulty sitting down on and standing up from a chair with arms (e.g., wheelchair, bedside commode, etc,.)?: Unable Help needed moving to and from a bed to chair (including a wheelchair)?: A Little Help needed walking in hospital room?: A Little Help needed climbing 3-5 steps with a railing? : A Lot 6 Click Score: 14    End of Session Equipment Utilized During Treatment: Back brace;Gait belt Activity Tolerance: Patient limited by pain;Patient tolerated treatment well Patient left: in bed;with call bell/phone within reach;with SCD's reapplied;with  family/visitor present Nurse Communication: Mobility status PT Visit Diagnosis: Unsteadiness on feet (R26.81);Pain Pain - part of body:  (back)    Time: 1003-4961 PT Time Calculation (min) (ACUTE ONLY): 33 min   Charges:   PT Evaluation $PT Eval Low Complexity: 1 Low PT Treatments $Gait Training: 8-22 mins   PT G Codes:        Mylo Red, PT, DPT [phone]    Blake Divine A Cortez Steelman 08/25/2016, 4:54 PM

## 2016-08-25 NOTE — Op Note (Signed)
08/25/2016  9:59 AM  PATIENT:  Tamara Roberts  76 y.o. female  PRE-OPERATIVE DIAGNOSIS:  Degenerative spondylolisthesis L4-5 with back and leg pain  POST-OPERATIVE DIAGNOSIS:  same  PROCEDURE:    1. Posterior fixation L4-5 using Alphatec cortical pedicle screws.  2. Intertransverse arthrodesis L4-5 using morcellized autograft and allograft soaked with bone marrow aspirate obtained through a separate fascial incision over the left iliac crest.  SURGEON:  Marikay Alar, MD  ASSISTANTS: Conchita Paris  ANESTHESIA:  General  EBL: 200 ml  Total I/O In: 1000 [I.V.:1000] Out: 365 [Urine:165; Blood:200]  BLOOD ADMINISTERED:none  DRAINS: none   INDICATION FOR PROCEDURE: This patient presented with severe chronic back and leg pain. Imaging revealed dynamic spondylolisthesis L4-5 without stenosis. The patient tried a reasonable attempt at conservative medical measures without relief. I recommended  instrumented fusion to address the segmental  instability.  We decided to not do an interbody fusion because she maintained a reasonable disc space height, was over 79 years old , had no significant stenosis on imaging. Patient understood the risks, benefits, and alternatives and potential outcomes and wished to proceed.  PROCEDURE DETAILS:  The patient was brought to the operating room. After induction of generalized endotracheal anesthesia the patient was rolled into the prone position on chest rolls and all pressure points were padded. The patient's lumbar region was cleaned and then prepped with DuraPrep and draped in the usual sterile fashion. Anesthesia was injected and then a dorsal midline incision was made and carried down to the lumbosacral fascia. The fascia was opened and the paraspinous musculature was taken down in a subperiosteal fashion to expose L4-5. A self-retaining retractor was placed. Intraoperative fluoroscopy confirmed my level, and I started with placement of the L4 and L5  cortical pedicle screws. The pedicle screw entry zones were identified utilizing surface landmarks and  AP and lateral fluoroscopy. I scored the cortex with the high-speed drill and then used the hand drill to drill an upward and outward direction into the pedicle. I then tapped line to line. I then placed a 5.0 x 35 mm cortical pedicle screw into the pedicles of L4 and L5 bilaterally.  I dissected in a suprafascial plane over the left iliac crest and open the fascia. I used a Jamshidi needle to extract about 30 mL of bone marrow aspirate from the left iliac crest. The morcellized allograft was then soaked with bone marrow aspirate. I placed Surgifoam into the hole in the iliac crest and then closed the fascia to this obtaining his tissue. We then decorticated the transverse processes and laid a mixture of morcellized autograft and allograft out over these to perform intertransverse arthrodesis at L4-5 bilaterally. We then placed lordotic rods into the multiaxial screw heads of the pedicle screws and locked these in position with the locking caps and anti-torque device. We then checked our construct with AP and lateral fluoroscopy. Irrigated with copious amounts of bacitracin-containing saline solution. We closed the muscle and the fascia with 0 Vicryl. Closed the subcutaneous tissues with 2-0 Vicryl and subcuticular tissues with 3-0 Vicryl. The skin was closed with benzoin and Steri-Strips. Dressing was then applied, the patient was awakened from general anesthesia and transported to the recovery room in stable condition. At the end of the procedure all sponge, needle and instrument counts were correct.   PLAN OF CARE: admit to inpatient  PATIENT DISPOSITION:  PACU - hemodynamically stable.   Delay start of Pharmacological VTE agent (>24hrs) due to surgical blood loss or  risk of bleeding:  yes

## 2016-08-26 MED ORDER — METHOCARBAMOL 1000 MG/10ML IJ SOLN: 500.0000 mg | Freq: Four times a day (QID) | INTRAVENOUS | 0 refills | Status: DC | PRN

## 2016-08-26 MED ORDER — HYDROCODONE-ACETAMINOPHEN 5-325 MG PO TABS: 1.0000 | ORAL_TABLET | ORAL | 0 refills | Status: DC | PRN

## 2016-08-26 NOTE — Anesthesia Postprocedure Evaluation (Signed)
Anesthesia Post Note  Patient: Tamara Roberts  Procedure(s) Performed: Procedure(s) (LRB): PLIF - L4-L5 (N/A)     Patient location during evaluation: PACU Anesthesia Type: General Level of consciousness: awake Pain management: pain level controlled Vital Signs Assessment: post-procedure vital signs reviewed and stable Respiratory status: spontaneous breathing Cardiovascular status: stable Postop Assessment: no signs of nausea or vomiting Anesthetic complications: no    Last Vitals:  Vitals:   08/26/16 0400 08/26/16 0748  BP: 108/62 (!) 119/58  Pulse: 77 79  Resp: 18 18  Temp: 36.9 C 37.2 C  SpO2: 97% 98%    Last Pain:  Vitals:   08/26/16 1008  TempSrc:   PainSc: 3    Pain Goal: Patients Stated Pain Goal: 3 (08/26/16 0908)               Sarath Privott JR,JOHN Susann Givens

## 2016-08-26 NOTE — Progress Notes (Signed)
Patient alert and oriented, mae's well, voiding adequate amount of urine, swallowing without difficulty, no c/o pain at time of discharge. Patient discharged home with family. Script and discharged instructions given to patient. Patient and family stated understanding of instructions given. Patient has an appointment with Dr. Jones °

## 2016-08-26 NOTE — Discharge Summary (Signed)
Physician Discharge Summary  Patient ID: JAYLISE PEEK MRN: [id number] DOB/AGE: 02-25-1940 76 y.o.  Admit date: 08/25/2016 Discharge date: 08/26/2016  Admission Diagnoses: Degenerative spondylolisthesis L4-5 with back and leg pain    Discharge Diagnoses: same as admitting   Discharged Condition: good  Hospital Course: The patient was admitted on 08/25/2016 and taken to the operating room where the patient underwent posterior fixation L4-5 with intertransverse arthrodesis L4-5. The patient tolerated the procedure well and was taken to the recovery room and then to the floor in stable condition. The hospital course was routine. There were no complications. The wound remained clean dry and intact. Pt had appropriate back soreness. No complaints of leg pain or new N/T/W. The patient remained afebrile with stable vital signs, and tolerated a regular diet. The patient continued to increase activities, and pain was well controlled with oral pain medications.   Consults: None  Significant Diagnostic Studies:  Results for orders placed or performed during the hospital encounter of 08/23/16  Surgical pcr screen  Result Value Ref Range   MRSA, PCR NEGATIVE NEGATIVE   Staphylococcus aureus NEGATIVE NEGATIVE  Protime-INR  Result Value Ref Range   Prothrombin Time 12.9 11.4 - 15.2 seconds   INR 0.97   CBC WITH DIFFERENTIAL  Result Value Ref Range   WBC 8.6 4.0 - 10.5 K/uL   RBC 5.05 3.87 - 5.11 MIL/uL   Hemoglobin 14.9 12.0 - 15.0 g/dL   HCT 56.2 13.0 - 86.5 %   MCV 89.3 78.0 - 100.0 fL   MCH 29.5 26.0 - 34.0 pg   MCHC 33.0 30.0 - 36.0 g/dL   RDW 78.4 69.6 - 29.5 %   Platelets 271 150 - 400 K/uL   Neutrophils Relative % 61 %   Neutro Abs 5.3 1.7 - 7.7 K/uL   Lymphocytes Relative 26 %   Lymphs Abs 2.2 0.7 - 4.0 K/uL   Monocytes Relative 10 %   Monocytes Absolute 0.8 0.1 - 1.0 K/uL   Eosinophils Relative 2 %   Eosinophils Absolute 0.2 0.0 - 0.7 K/uL   Basophils Relative 1 %    Basophils Absolute 0.1 0.0 - 0.1 K/uL  Basic metabolic panel  Result Value Ref Range   Sodium 140 135 - 145 mmol/L   Potassium 3.8 3.5 - 5.1 mmol/L   Chloride 108 101 - 111 mmol/L   CO2 22 22 - 32 mmol/L   Glucose, Bld 98 65 - 99 mg/dL   BUN 18 6 - 20 mg/dL   Creatinine, Ser 2.84 (H) 0.44 - 1.00 mg/dL   Calcium 9.5 8.9 - 13.2 mg/dL   GFR calc non Af Amer 39 (L) >60 mL/min   GFR calc Af Amer 45 (L) >60 mL/min   Anion gap 10 5 - 15  Type and screen All Cardiac and thoracic surgeries, spinal fusions, myomectomies, craniotomies, colon & liver resections, total joint revisions, same day c-section with placenta previa or accreta.  Result Value Ref Range   ABO/RH(D) A NEG    Antibody Screen NEG    Sample Expiration 09/06/2016    Extend sample reason NO TRANSFUSIONS OR PREGNANCY IN THE PAST 3 MONTHS   ABO/Rh  Result Value Ref Range   ABO/RH(D) A NEG     Chest 2 View  Result Date: 08/23/2016 CLINICAL DATA:  Preop for spine surgery.  No chest complaints. EXAM: CHEST  2 VIEW COMPARISON:  04/01/2016 FINDINGS: Upper lumbar vertebral augmentation. Right rotator cuff repair. Midline trachea. Mild cardiomegaly with a tortuous  thoracic aorta. Atherosclerosis in the transverse aorta. No pleural effusion or pneumothorax. No congestive failure. Clear lungs. IMPRESSION: No acute cardiopulmonary disease. Mild cardiomegaly.  Aortic Atherosclerosis (ICD10-I70.0). Electronically Signed   By: Jeronimo Greaves M.D.   On: 08/23/2016 14:16   Dg Lumbar Spine 2-3 Views  Result Date: 08/25/2016 CLINICAL DATA:  76 year old female undergoing lumbar surgery. EXAM: LUMBAR SPINE - 2-3 VIEW; DG C-ARM 61-120 MIN COMPARISON:  [place] [facility] lumbar radiographs 07/18/2016. Lumbar MRI [facility] 04/26/2016. FINDINGS: Normal lumbar segmentation demonstrated on the recent radiographs which is the same numbering system used on the prior MRI. Previous L1 kyphoplasty or vertebroplasty incidentally noted. 2  intraoperative fluoroscopic spot views of the lower lumbar spine bilateral transpedicular hardware at L4-L5. Underlying chronic grade 1 spondylolisthesis at that level. The hardware appears intact. Stable vertebral height. FLUOROSCOPY TIME:  1 minutes 21 seconds IMPRESSION: Bilateral transpedicular hardware placed at L4-L5. Electronically Signed   By: Odessa Fleming M.D.   On: 08/25/2016 09:59   Dg C-arm 1-60 Min  Result Date: 08/25/2016 CLINICAL DATA:  76 year old female undergoing lumbar surgery. EXAM: LUMBAR SPINE - 2-3 VIEW; DG C-ARM 61-120 MIN COMPARISON:  [place] [facility] lumbar radiographs 07/18/2016. Lumbar MRI [facility] 04/26/2016. FINDINGS: Normal lumbar segmentation demonstrated on the recent radiographs which is the same numbering system used on the prior MRI. Previous L1 kyphoplasty or vertebroplasty incidentally noted. 2 intraoperative fluoroscopic spot views of the lower lumbar spine bilateral transpedicular hardware at L4-L5. Underlying chronic grade 1 spondylolisthesis at that level. The hardware appears intact. Stable vertebral height. FLUOROSCOPY TIME:  1 minutes 21 seconds IMPRESSION: Bilateral transpedicular hardware placed at L4-L5. Electronically Signed   By: Odessa Fleming M.D.   On: 08/25/2016 09:59    Antibiotics:  Anti-infectives    Start     Dose/Rate Route Frequency Ordered Stop   08/25/16 1600  ceFAZolin (ANCEF) IVPB 2g/100 mL premix     2 g 200 mL/hr over 30 Minutes Intravenous Every 8 hours 08/25/16 1438 08/25/16 2354   08/25/16 0842  vancomycin (VANCOCIN) powder  Status:  Discontinued       As needed 08/25/16 0842 08/25/16 1008   08/25/16 0841  bacitracin 50,000 Units in sodium chloride irrigation 0.9 % 500 mL irrigation  Status:  Discontinued       As needed 08/25/16 0841 08/25/16 1008   08/25/16 0715  ceFAZolin (ANCEF) IVPB 2g/100 mL premix     2 g 200 mL/hr over 30 Minutes Intravenous On call to O.R. 08/24/16 1522 08/25/16 0747      Discharge  Exam: Blood pressure 108/62, pulse 77, temperature 98.4 F (36.9 C), temperature source Oral, resp. rate 18, height 5' (1.524 m), weight 76.7 kg (169 lb), SpO2 97 %. Neurologic: Grossly normal Ambulating and voiding well  Discharge Medications:   Allergies as of 08/26/2016      Reactions   Feldene [piroxicam] Shortness Of Breath   Codeine Nausea Only      Medication List    TAKE these medications   aspirin EC 81 MG tablet Take 81 mg by mouth daily.   atorvastatin 40 MG tablet Commonly known as:  LIPITOR Take 1 tablet (40 mg total) by mouth every evening.   chlorpheniramine 4 MG tablet Commonly known as:  CHLOR-TRIMETON Take 4 mg by mouth daily as needed for allergies.   cholecalciferol 1000 units tablet Commonly known as:  VITAMIN D Take 1,000 Units by mouth daily.   clopidogrel 75 MG tablet Commonly known as:  PLAVIX  Take 75 mg by mouth daily.   DULoxetine 30 MG capsule Commonly known as:  CYMBALTA Take 30 mg by mouth at bedtime.   fluticasone 50 MCG/ACT nasal spray Commonly known as:  FLONASE Place 1 spray into both nostrils daily as needed for allergies or rhinitis.   HYDROcodone-acetaminophen 5-325 MG tablet Commonly known as:  NORCO/VICODIN Take 1 tablet by mouth every 4 (four) hours as needed for moderate pain.   methocarbamol 500 mg in dextrose 5 % 50 mL Inject 500 mg into the vein every 6 (six) hours as needed.   OPCON-A OP Apply 1 drop to eye 2 (two) times daily as needed (dry eyes).   pregabalin 75 MG capsule Commonly known as:  LYRICA Take 75 mg by mouth at bedtime.   temazepam 30 MG capsule Commonly known as:  RESTORIL Take 30 mg by mouth at bedtime as needed for sleep.   traMADol 50 MG tablet Commonly known as:  ULTRAM Take 50 mg by mouth 3 (three) times daily as needed for moderate pain.            Durable Medical Equipment        Start     Ordered   08/25/16 1439  DME Walker rolling  Once    Question:  Patient needs a walker to  treat with the following condition  Answer:  S/P lumbar fusion   08/25/16 1438   08/25/16 1439  DME 3 n 1  Once     08/25/16 1438       Discharge Care Instructions        Start     Ordered   08/26/16 0000  methocarbamol 500 mg in dextrose 5 % 50 mL  Every 6 hours PRN     08/26/16 0638   08/26/16 0000  Increase activity slowly     08/26/16 0638   08/26/16 0000  Diet - low sodium heart healthy     08/26/16 0638   08/26/16 0000  Driving Restrictions    Comments:  No driving 2 weeks   76/16/07 3710   08/26/16 0000  Lifting restrictions    Comments:  No lifting more than 8 lbs   08/26/16 6269   08/26/16 0000   Remove dressing in 72 hours     08/26/16 0638   08/26/16 0000  Call MD for:  extreme fatigue     08/26/16 4854   08/26/16 0000  Call MD for:  persistant dizziness or light-headedness     08/26/16 0638   08/26/16 0000  Call MD for:  hives     08/26/16 0638   08/26/16 0000  Call MD for:  difficulty breathing, headache or visual disturbances     08/26/16 6270   08/26/16 0000  Call MD for:  redness, tenderness, or signs of infection (pain, swelling, redness, odor or green/yellow discharge around incision site)     08/26/16 3500   08/26/16 0000  Call MD for:  severe uncontrolled pain     08/26/16 0638   08/26/16 0000  Call MD for:  persistant nausea and vomiting     08/26/16 0638   08/26/16 0000  Call MD for:  temperature >100.4     08/26/16 0638   08/26/16 0000  HYDROcodone-acetaminophen (NORCO/VICODIN) 5-325 MG tablet  Every 4 hours PRN     08/26/16 9381      Disposition: home   Final Dx: same as admitting  Discharge Instructions     Remove dressing in 72  hours    Complete by:  As directed    Call MD for:  difficulty breathing, headache or visual disturbances    Complete by:  As directed    Call MD for:  extreme fatigue    Complete by:  As directed    Call MD for:  hives    Complete by:  As directed    Call MD for:  persistant dizziness or light-headedness     Complete by:  As directed    Call MD for:  persistant nausea and vomiting    Complete by:  As directed    Call MD for:  redness, tenderness, or signs of infection (pain, swelling, redness, odor or green/yellow discharge around incision site)    Complete by:  As directed    Call MD for:  severe uncontrolled pain    Complete by:  As directed    Call MD for:  temperature >100.4    Complete by:  As directed    Diet - low sodium heart healthy    Complete by:  As directed    Driving Restrictions    Complete by:  As directed    No driving 2 weeks   Increase activity slowly    Complete by:  As directed    Lifting restrictions    Complete by:  As directed    No lifting more than 8 lbs         Signed: Tiana Loft Hasset Chaviano 08/26/2016, 6:39 AM

## 2016-08-26 NOTE — Progress Notes (Signed)
Physical Therapy Treatment Patient Details Name: Tamara Roberts MRN: [id number] DOB: [birth year] Today's Date: 08/26/2016    History of Present Illness Patient is a 76 y/o female s/p L4-5 PLIF. PMH includes stroke, HTN, HLD.    PT Comments    Pt progressing well towards physical therapy goals. Was able to perform transfers and ambulation with gross    Follow Up Recommendations  No PT follow up;Supervision for mobility/OOB     Equipment Recommendations  None recommended by PT    Recommendations for Other Services       Precautions / Restrictions Precautions Precautions: Back Precaution Booklet Issued: Yes (comment) Precaution Comments: Reviewed handout and precautions Required Braces or Orthoses: Spinal Brace Spinal Brace: Applied in sitting position;Lumbar corset Restrictions Weight Bearing Restrictions: No    Mobility  Bed Mobility Overal bed mobility: Needs Assistance Bed Mobility: Rolling;Sidelying to Sit Rolling: Min guard Sidelying to sit: Min guard (Verbal cues for technique )       General bed mobility comments: Pt sitting up in recliner upon PT arrival.   Transfers Overall transfer level: Needs assistance Equipment used: 1 person hand held assist Transfers: Sit to/from Stand Sit to Stand: Min guard         General transfer comment: Min guard for safety. Pt able to power up and walk with no physical assist.   Ambulation/Gait Ambulation/Gait assistance: Min guard assist; supervision Ambulation Distance (Feet): 150 Feet Assistive device: 1 person hand held assist Gait Pattern/deviations: Step-to pattern;Step-through pattern;Decreased stride length Gait velocity: decreased Gait velocity interpretation: Below normal speed for age/gender General Gait Details: Slow but generally steady gait. Pt with no LOB but was not able to walk and talk at the same time. If pt was asked a question, she had to stop walking before answering.    Stairs Stairs:   (Deferred)          Wheelchair Mobility    Modified Rankin (Stroke Patients Only)       Balance Overall balance assessment: Needs assistance Sitting-balance support: No upper extremity supported;Feet supported Sitting balance-Leahy Scale: Good     Standing balance support: During functional activity Standing balance-Leahy Scale: Fair Standing balance comment: Able to stand at sink and simulate brushing teeth with no difficulty.                             Cognition Arousal/Alertness: Awake/alert Behavior During Therapy: WFL for tasks assessed/performed Overall Cognitive Status: Within Functional Limits for tasks assessed                                        Exercises      General Comments General comments (skin integrity, edema, etc.): Pt eager to practice as things will be at home. Cautious to learn how to do things the right way. Pt reports feeling much better than yesterday.       Pertinent Vitals/Pain Pain Assessment: 0-10 Pain Score: 4  Pain Location: back Pain Descriptors / Indicators: Sore;Operative site guarding Pain Intervention(s): Limited activity within patient's tolerance;Monitored during session;Repositioned    Home Living Family/patient expects to be discharged to:: Private residence Living Arrangements: Spouse/significant other Available Help at Discharge: Family;Available 24 hours/day Type of Home: House Home Access: Ramped entrance   Home Layout: One level Home Equipment: Walker - 2 wheels;Cane - single point      Prior  Function Level of Independence: Independent      Comments: Drives, cooks, cleans.   PT Goals (current goals can now be found in the care plan section) Acute Rehab PT Goals Patient Stated Goal: to return to PLOF PT Goal Formulation: With patient Time For Goal Achievement: 09/08/16 Potential to Achieve Goals: Good Progress towards PT goals: Progressing toward goals    Frequency    Min  5X/week      PT Plan Discharge plan needs to be updated    Co-evaluation              AM-PAC PT "6 Clicks" Daily Activity  Outcome Measure  Difficulty turning over in bed (including adjusting bedclothes, sheets and blankets)?: None Difficulty moving from lying on back to sitting on the side of the bed? : Unable Difficulty sitting down on and standing up from a chair with arms (e.g., wheelchair, bedside commode, etc,.)?: Unable Help needed moving to and from a bed to chair (including a wheelchair)?: A Little Help needed walking in hospital room?: A Little Help needed climbing 3-5 steps with a railing? : A Lot 6 Click Score: 14    End of Session Equipment Utilized During Treatment: Back brace;Gait belt Activity Tolerance: Patient limited by pain;Patient tolerated treatment well Patient left: in bed;with call bell/phone within reach;with SCD's reapplied;with family/visitor present Nurse Communication: Mobility status PT Visit Diagnosis: Unsteadiness on feet (R26.81);Pain Pain - part of body:  (back)     Time: 2440-1027 PT Time Calculation (min) (ACUTE ONLY): 16 min  Charges:  $Gait Training: 8-22 mins                    G Codes:       Conni Slipper, PT, DPT Acute Rehabilitation Services Pager: [phone]    Marylynn Pearson 08/26/2016, 11:12 AM

## 2016-08-26 NOTE — Therapy (Signed)
Occupational Therapy Evaluation Patient Details Name: Tamara Roberts MRN: [id number] DOB: Dec 06, 1940 Today's Date: 08/26/2016    History of Present Illness Patient is a 76 y/o female s/p L4-5 PLIF. PMH includes stroke, HTN, HLD.   Clinical Impression   PTA, pt reports being independent with all ADLs. Currently pt requires min assist for LB ADLs for safety and adherence to back precautions. She requires supervision and set up for all other ADLs. Pt reports family is able to provide 24/7 as needed initially. OT will continue to follow acutely to address established goals.    Follow Up Recommendations  No OT follow up;Supervision/Assistance - 24 hour    Equipment Recommendations  None recommended by OT (Pt already has all equipment at home. )    Recommendations for Other Services       Precautions / Restrictions Precautions Precautions: Back Precaution Booklet Issued: Yes (comment) Precaution Comments: Reviewed handout and precautions Required Braces or Orthoses: Spinal Brace Spinal Brace: Applied in sitting position;Lumbar corset Restrictions Weight Bearing Restrictions: No      Mobility Bed Mobility Overal bed mobility: Needs Assistance Bed Mobility: Rolling;Sidelying to Sit Rolling: Min guard Sidelying to sit: Min guard (Verbal cues for technique )       General bed mobility comments: Cues for log roll technique.   Transfers Overall transfer level: Needs assistance   Transfers: Sit to/from Stand Sit to Stand: Min guard         General transfer comment: Min guard for safety. Pt able to power up and walk with no physical assist.     Balance Overall balance assessment: Needs assistance Sitting-balance support: No upper extremity supported;Feet supported Sitting balance-Leahy Scale: Good     Standing balance support: During functional activity Standing balance-Leahy Scale: Fair Standing balance comment: Able to stand at sink and simulate brushing teeth with  no difficulty.                            ADL either performed or assessed with clinical judgement   ADL Overall ADL's : Needs assistance/impaired Eating/Feeding: Set up;Sitting   Grooming: Set up;Standing;Supervision/safety;Cueing for compensatory techniques;Cueing for safety   Upper Body Bathing: Set up;Cueing for safety;Sitting   Lower Body Bathing: Minimal assistance;Cueing for compensatory techniques;Cueing for back precautions;Sit to/from stand Lower Body Bathing Details (indicate cue type and reason): Pt not able to bring foot over knee for LB bathing. Upper Body Dressing : Set up;Cueing for safety;Sitting   Lower Body Dressing: Minimal assistance;Cueing for safety;Cueing for back precautions;Adhering to back precautions;Sit to/from stand Lower Body Dressing Details (indicate cue type and reason): Pt unable to bring foot over knee for LB dressing.  Toilet Transfer: Set up;min guard;Cueing for sequencing;Cueing for safety;Ambulation;Grab bars;Comfort height toilet Toilet Transfer Details (indicate cue type and reason): Use of grab bars to boost up  Toileting- Clothing Manipulation and Hygiene: Set up;Cueing for safety;Cueing for back precautions;Cueing for compensatory techniques;Sit to/from stand;Supervision/safety Toileting - Clothing Manipulation Details (indicate cue type and reason): Pt educated on compensatory strategies for hygiene    Functional mobility during ADLs: min guard       Vision         Perception     Praxis      Pertinent Vitals/Pain Pain Assessment: 0-10 Pain Score: 4  Pain Location: back Pain Descriptors / Indicators: Sore;Operative site guarding Pain Intervention(s): Limited activity within patient's tolerance;Monitored during session     Hand Dominance     Extremity/Trunk  Assessment Upper Extremity Assessment Upper Extremity Assessment: Overall WFL for tasks assessed   Lower Extremity Assessment Lower Extremity Assessment:  Defer to PT evaluation   Cervical / Trunk Assessment Cervical / Trunk Assessment: Other exceptions Cervical / Trunk Exceptions: s/p spine surgery   Communication Communication Communication: No difficulties   Cognition Arousal/Alertness: Awake/alert Behavior During Therapy: WFL for tasks assessed/performed Overall Cognitive Status: Within Functional Limits for tasks assessed                                     General Comments  Pt eager to practice as things will be at home (HOB down, guard rail down during bed mobility). Cautious to learn how to do things the right way. Pt reports feeling much better than yesterday.     Exercises     Shoulder Instructions      Home Living Family/patient expects to be discharged to:: Private residence Living Arrangements: Spouse/significant other Available Help at Discharge: Family;Available 24 hours/day Type of Home: House Home Access: Ramped entrance     Home Layout: One level     Bathroom Shower/Tub: Chief Strategy Officer: Handicapped height Bathroom Accessibility: Yes How Accessible: Accessible via walker Home Equipment: Walker - 2 wheels;Cane - single point          Prior Functioning/Environment Level of Independence: Independent        Comments: Drives, cooks, cleans.        OT Problem List: Impaired balance (sitting and/or standing);Decreased knowledge of use of DME or AE;Pain      OT Treatment/Interventions: Self-care/ADL training;DME and/or AE instruction;Energy conservation;Therapeutic activities;Patient/family education;Balance training    OT Goals(Current goals can be found in the care plan section) Acute Rehab OT Goals Patient Stated Goal: to return to PLOF OT Goal Formulation: With patient Time For Goal Achievement: 08/26/16 Potential to Achieve Goals: Good  OT Frequency: Min 2X/week   Barriers to D/C:            Co-evaluation              AM-PAC PT "6 Clicks" Daily  Activity     Outcome Measure Help from another person eating meals?: None Help from another person taking care of personal grooming?: None Help from another person toileting, which includes using toliet, bedpan, or urinal?: None Help from another person bathing (including washing, rinsing, drying)?: A Little Help from another person to put on and taking off regular upper body clothing?: None Help from another person to put on and taking off regular lower body clothing?: A Little 6 Click Score: 22   End of Session Equipment Utilized During Treatment: Gait belt;Back brace Nurse Communication: Mobility status  Activity Tolerance: Patient tolerated treatment well Patient left: in chair;with call bell/phone within reach  OT Visit Diagnosis: Unsteadiness on feet (R26.81);Pain Pain - part of body:  (Back)                Time: 3143-8887 OT Time Calculation (min): 24 min Charges:   OT visit  OT eval moderate complexity  Self care 1 unit  G-Codes:      Cammy Copa, OTS [phone]  Cammy Copa 08/26/2016, 8:56 AM

## 2016-10-11 DIAGNOSIS — M4316 Spondylolisthesis, lumbar region: Secondary | ICD-10-CM | POA: Diagnosis not present

## 2016-10-19 ENCOUNTER — Other Ambulatory Visit: Payer: Self-pay | Admitting: Internal Medicine

## 2016-10-19 DIAGNOSIS — Z1231 Encounter for screening mammogram for malignant neoplasm of breast: Secondary | ICD-10-CM

## 2016-10-20 ENCOUNTER — Ambulatory Visit
Admission: RE | Admit: 2016-10-20 | Discharge: 2016-10-20 | Disposition: A | Discharge status: Home / Self Care | Payer: PPO | Source: Ambulatory Visit | Attending: Internal Medicine | Admitting: Internal Medicine

## 2016-10-20 DIAGNOSIS — Z1231 Encounter for screening mammogram for malignant neoplasm of breast: Secondary | ICD-10-CM

## 2016-10-28 DIAGNOSIS — R350 Frequency of micturition: Secondary | ICD-10-CM | POA: Diagnosis not present

## 2016-10-28 DIAGNOSIS — Z6832 Body mass index (BMI) 32.0-32.9, adult: Secondary | ICD-10-CM | POA: Diagnosis not present

## 2016-10-28 DIAGNOSIS — M545 Low back pain: Secondary | ICD-10-CM | POA: Diagnosis not present

## 2016-10-28 DIAGNOSIS — Z23 Encounter for immunization: Secondary | ICD-10-CM | POA: Diagnosis not present

## 2016-10-28 DIAGNOSIS — R109 Unspecified abdominal pain: Secondary | ICD-10-CM | POA: Diagnosis not present

## 2016-10-28 DIAGNOSIS — Z299 Encounter for prophylactic measures, unspecified: Secondary | ICD-10-CM | POA: Diagnosis not present

## 2016-10-28 DIAGNOSIS — R1031 Right lower quadrant pain: Secondary | ICD-10-CM | POA: Diagnosis not present

## 2016-10-28 DIAGNOSIS — R5383 Other fatigue: Secondary | ICD-10-CM | POA: Diagnosis not present

## 2016-12-05 DIAGNOSIS — M810 Age-related osteoporosis without current pathological fracture: Secondary | ICD-10-CM | POA: Diagnosis not present

## 2016-12-05 DIAGNOSIS — B029 Zoster without complications: Secondary | ICD-10-CM | POA: Diagnosis not present

## 2016-12-05 DIAGNOSIS — I10 Essential (primary) hypertension: Secondary | ICD-10-CM | POA: Diagnosis not present

## 2016-12-05 DIAGNOSIS — Z6830 Body mass index (BMI) 30.0-30.9, adult: Secondary | ICD-10-CM | POA: Diagnosis not present

## 2016-12-05 DIAGNOSIS — J029 Acute pharyngitis, unspecified: Secondary | ICD-10-CM | POA: Diagnosis not present

## 2016-12-05 DIAGNOSIS — Z299 Encounter for prophylactic measures, unspecified: Secondary | ICD-10-CM | POA: Diagnosis not present

## 2016-12-05 DIAGNOSIS — Z789 Other specified health status: Secondary | ICD-10-CM | POA: Diagnosis not present

## 2016-12-05 DIAGNOSIS — E7800 Pure hypercholesterolemia, unspecified: Secondary | ICD-10-CM | POA: Diagnosis not present

## 2016-12-05 DIAGNOSIS — F329 Major depressive disorder, single episode, unspecified: Secondary | ICD-10-CM | POA: Diagnosis not present

## 2016-12-06 DIAGNOSIS — Z6830 Body mass index (BMI) 30.0-30.9, adult: Secondary | ICD-10-CM | POA: Diagnosis not present

## 2016-12-06 DIAGNOSIS — Z299 Encounter for prophylactic measures, unspecified: Secondary | ICD-10-CM | POA: Diagnosis not present

## 2016-12-06 DIAGNOSIS — F329 Major depressive disorder, single episode, unspecified: Secondary | ICD-10-CM | POA: Diagnosis not present

## 2016-12-06 DIAGNOSIS — Z713 Dietary counseling and surveillance: Secondary | ICD-10-CM | POA: Diagnosis not present

## 2016-12-06 DIAGNOSIS — J019 Acute sinusitis, unspecified: Secondary | ICD-10-CM | POA: Diagnosis not present

## 2016-12-15 DIAGNOSIS — Z883 Allergy status to other anti-infective agents status: Secondary | ICD-10-CM | POA: Diagnosis not present

## 2016-12-15 DIAGNOSIS — M6281 Muscle weakness (generalized): Secondary | ICD-10-CM | POA: Diagnosis not present

## 2016-12-15 DIAGNOSIS — Z79899 Other long term (current) drug therapy: Secondary | ICD-10-CM | POA: Diagnosis not present

## 2016-12-15 DIAGNOSIS — Z7982 Long term (current) use of aspirin: Secondary | ICD-10-CM | POA: Diagnosis not present

## 2016-12-15 DIAGNOSIS — Z8249 Family history of ischemic heart disease and other diseases of the circulatory system: Secondary | ICD-10-CM | POA: Diagnosis not present

## 2016-12-15 DIAGNOSIS — Z886 Allergy status to analgesic agent status: Secondary | ICD-10-CM | POA: Diagnosis not present

## 2016-12-15 DIAGNOSIS — R531 Weakness: Secondary | ICD-10-CM | POA: Diagnosis not present

## 2016-12-15 DIAGNOSIS — Z7902 Long term (current) use of antithrombotics/antiplatelets: Secondary | ICD-10-CM | POA: Diagnosis not present

## 2016-12-15 DIAGNOSIS — Z90710 Acquired absence of both cervix and uterus: Secondary | ICD-10-CM | POA: Diagnosis not present

## 2016-12-15 DIAGNOSIS — Z888 Allergy status to other drugs, medicaments and biological substances status: Secondary | ICD-10-CM | POA: Diagnosis not present

## 2016-12-15 DIAGNOSIS — R42 Dizziness and giddiness: Secondary | ICD-10-CM | POA: Diagnosis not present

## 2016-12-15 DIAGNOSIS — R2681 Unsteadiness on feet: Secondary | ICD-10-CM | POA: Diagnosis not present

## 2016-12-15 DIAGNOSIS — F329 Major depressive disorder, single episode, unspecified: Secondary | ICD-10-CM | POA: Diagnosis not present

## 2016-12-15 DIAGNOSIS — M1990 Unspecified osteoarthritis, unspecified site: Secondary | ICD-10-CM | POA: Diagnosis not present

## 2016-12-15 DIAGNOSIS — E7800 Pure hypercholesterolemia, unspecified: Secondary | ICD-10-CM | POA: Diagnosis not present

## 2016-12-15 DIAGNOSIS — M545 Low back pain: Secondary | ICD-10-CM | POA: Diagnosis not present

## 2016-12-15 DIAGNOSIS — K219 Gastro-esophageal reflux disease without esophagitis: Secondary | ICD-10-CM | POA: Diagnosis not present

## 2016-12-15 DIAGNOSIS — Z8673 Personal history of transient ischemic attack (TIA), and cerebral infarction without residual deficits: Secondary | ICD-10-CM | POA: Diagnosis not present

## 2016-12-15 DIAGNOSIS — Z808 Family history of malignant neoplasm of other organs or systems: Secondary | ICD-10-CM | POA: Diagnosis not present

## 2016-12-16 DIAGNOSIS — M545 Low back pain: Secondary | ICD-10-CM | POA: Diagnosis not present

## 2016-12-16 DIAGNOSIS — F329 Major depressive disorder, single episode, unspecified: Secondary | ICD-10-CM | POA: Diagnosis not present

## 2016-12-16 DIAGNOSIS — M6281 Muscle weakness (generalized): Secondary | ICD-10-CM | POA: Diagnosis not present

## 2016-12-16 DIAGNOSIS — R42 Dizziness and giddiness: Secondary | ICD-10-CM | POA: Diagnosis not present

## 2016-12-22 DIAGNOSIS — R05 Cough: Secondary | ICD-10-CM | POA: Diagnosis not present

## 2016-12-22 DIAGNOSIS — G8929 Other chronic pain: Secondary | ICD-10-CM | POA: Diagnosis not present

## 2016-12-22 DIAGNOSIS — Z6829 Body mass index (BMI) 29.0-29.9, adult: Secondary | ICD-10-CM | POA: Diagnosis not present

## 2016-12-22 DIAGNOSIS — Z299 Encounter for prophylactic measures, unspecified: Secondary | ICD-10-CM | POA: Diagnosis not present

## 2016-12-22 DIAGNOSIS — R112 Nausea with vomiting, unspecified: Secondary | ICD-10-CM | POA: Diagnosis not present

## 2017-01-17 DIAGNOSIS — M4316 Spondylolisthesis, lumbar region: Secondary | ICD-10-CM | POA: Diagnosis not present

## 2017-03-13 DIAGNOSIS — G4700 Insomnia, unspecified: Secondary | ICD-10-CM | POA: Diagnosis not present

## 2017-03-13 DIAGNOSIS — Z299 Encounter for prophylactic measures, unspecified: Secondary | ICD-10-CM | POA: Diagnosis not present

## 2017-03-13 DIAGNOSIS — E7800 Pure hypercholesterolemia, unspecified: Secondary | ICD-10-CM | POA: Diagnosis not present

## 2017-03-13 DIAGNOSIS — I639 Cerebral infarction, unspecified: Secondary | ICD-10-CM | POA: Diagnosis not present

## 2017-03-13 DIAGNOSIS — Z79899 Other long term (current) drug therapy: Secondary | ICD-10-CM | POA: Diagnosis not present

## 2017-03-13 DIAGNOSIS — Z789 Other specified health status: Secondary | ICD-10-CM | POA: Diagnosis not present

## 2017-03-13 DIAGNOSIS — Z6829 Body mass index (BMI) 29.0-29.9, adult: Secondary | ICD-10-CM | POA: Diagnosis not present

## 2017-04-14 ENCOUNTER — Emergency Department (HOSPITAL_COMMUNITY): Payer: PPO

## 2017-04-14 ENCOUNTER — Encounter (HOSPITAL_COMMUNITY): Payer: Self-pay | Admitting: Emergency Medicine

## 2017-04-14 ENCOUNTER — Observation Stay (HOSPITAL_COMMUNITY)
Admission: EM | Admit: 2017-04-14 | Discharge: 2017-04-15 | Disposition: A | Discharge status: Home / Self Care | Payer: PPO | Attending: Internal Medicine | Admitting: Internal Medicine

## 2017-04-14 ENCOUNTER — Other Ambulatory Visit: Payer: Self-pay

## 2017-04-14 DIAGNOSIS — R079 Chest pain, unspecified: Secondary | ICD-10-CM | POA: Diagnosis not present

## 2017-04-14 DIAGNOSIS — N183 Chronic kidney disease, stage 3 unspecified: Secondary | ICD-10-CM

## 2017-04-14 DIAGNOSIS — Z7982 Long term (current) use of aspirin: Secondary | ICD-10-CM | POA: Diagnosis not present

## 2017-04-14 DIAGNOSIS — N289 Disorder of kidney and ureter, unspecified: Secondary | ICD-10-CM

## 2017-04-14 DIAGNOSIS — I129 Hypertensive chronic kidney disease with stage 1 through stage 4 chronic kidney disease, or unspecified chronic kidney disease: Secondary | ICD-10-CM | POA: Diagnosis not present

## 2017-04-14 DIAGNOSIS — E876 Hypokalemia: Secondary | ICD-10-CM | POA: Diagnosis present

## 2017-04-14 DIAGNOSIS — Z8673 Personal history of transient ischemic attack (TIA), and cerebral infarction without residual deficits: Secondary | ICD-10-CM

## 2017-04-14 DIAGNOSIS — Z79899 Other long term (current) drug therapy: Secondary | ICD-10-CM | POA: Insufficient documentation

## 2017-04-14 DIAGNOSIS — K219 Gastro-esophageal reflux disease without esophagitis: Secondary | ICD-10-CM | POA: Diagnosis not present

## 2017-04-14 DIAGNOSIS — I4891 Unspecified atrial fibrillation: Principal | ICD-10-CM | POA: Diagnosis present

## 2017-04-14 DIAGNOSIS — I10 Essential (primary) hypertension: Secondary | ICD-10-CM | POA: Diagnosis present

## 2017-04-14 DIAGNOSIS — R002 Palpitations: Secondary | ICD-10-CM | POA: Diagnosis not present

## 2017-04-14 DIAGNOSIS — Z981 Arthrodesis status: Secondary | ICD-10-CM

## 2017-04-14 LAB — CBC
HEMATOCRIT: 43.1 % (ref 36.0–46.0)
Hemoglobin: 13.8 g/dL (ref 12.0–15.0)
MCH: 28.7 pg (ref 26.0–34.0)
MCHC: 32 g/dL (ref 30.0–36.0)
MCV: 89.6 fL (ref 78.0–100.0)
PLATELETS: 275 10*3/uL (ref 150–400)
RBC: 4.81 MIL/uL (ref 3.87–5.11)
RDW: 13.3 % (ref 11.5–15.5)
WBC: 7.8 10*3/uL (ref 4.0–10.5)

## 2017-04-14 LAB — I-STAT CHEM 8, ED
BUN: 15 mg/dL (ref 6–20)
CREATININE: 1.2 mg/dL — AB (ref 0.44–1.00)
Calcium, Ion: 1.09 mmol/L — ABNORMAL LOW (ref 1.15–1.40)
Chloride: 109 mmol/L (ref 101–111)
Glucose, Bld: 103 mg/dL — ABNORMAL HIGH (ref 65–99)
HEMATOCRIT: 43 % (ref 36.0–46.0)
Hemoglobin: 14.6 g/dL (ref 12.0–15.0)
Potassium: 3.4 mmol/L — ABNORMAL LOW (ref 3.5–5.1)
Sodium: 144 mmol/L (ref 135–145)
TCO2: 23 mmol/L (ref 22–32)

## 2017-04-14 LAB — MAGNESIUM: MAGNESIUM: 2 mg/dL (ref 1.7–2.4)

## 2017-04-14 LAB — I-STAT TROPONIN, ED: Troponin i, poc: 0 ng/mL (ref 0.00–0.08)

## 2017-04-14 LAB — BASIC METABOLIC PANEL
Anion gap: 15 (ref 5–15)
BUN: 16 mg/dL (ref 6–20)
CHLORIDE: 107 mmol/L (ref 101–111)
CO2: 22 mmol/L (ref 22–32)
CREATININE: 1.2 mg/dL — AB (ref 0.44–1.00)
Calcium: 9.5 mg/dL (ref 8.9–10.3)
GFR calc non Af Amer: 43 mL/min — ABNORMAL LOW (ref >60)
GFR, EST AFRICAN AMERICAN: 50 mL/min — AB (ref >60)
Glucose, Bld: 107 mg/dL — ABNORMAL HIGH (ref 65–99)
POTASSIUM: 3.4 mmol/L — AB (ref 3.5–5.1)
Sodium: 144 mmol/L (ref 135–145)

## 2017-04-14 LAB — BRAIN NATRIURETIC PEPTIDE: B NATRIURETIC PEPTIDE 5: 225 pg/mL — AB (ref 0.0–100.0)

## 2017-04-14 LAB — PHOSPHORUS: Phosphorus: 2.7 mg/dL (ref 2.5–4.6)

## 2017-04-14 LAB — TSH: TSH: 5.672 u[IU]/mL — ABNORMAL HIGH (ref 0.350–4.500)

## 2017-04-14 MED ORDER — ONDANSETRON HCL 4 MG PO TABS: 4.0000 mg | ORAL_TABLET | Freq: Four times a day (QID) | ORAL | Status: DC | PRN

## 2017-04-14 MED ORDER — ACETAMINOPHEN 650 MG RE SUPP: 650.0000 mg | Freq: Four times a day (QID) | RECTAL | Status: DC | PRN

## 2017-04-14 MED ORDER — POTASSIUM CHLORIDE CRYS ER 20 MEQ PO TBCR
40.0000 meq | EXTENDED_RELEASE_TABLET | Freq: Once | ORAL | Status: AC
  Administered 2017-04-15: 40 meq via ORAL
  Filled 2017-04-14: qty 2

## 2017-04-14 MED ORDER — ACETAMINOPHEN 325 MG PO TABS: 650.0000 mg | ORAL_TABLET | Freq: Four times a day (QID) | ORAL | Status: DC | PRN

## 2017-04-14 MED ORDER — POTASSIUM CHLORIDE IN NACL 40-0.9 MEQ/L-% IV SOLN
INTRAVENOUS | Status: DC
  Administered 2017-04-15: 100 mL/h via INTRAVENOUS
  Filled 2017-04-14: qty 1000

## 2017-04-14 MED ORDER — DILTIAZEM HCL-DEXTROSE 100-5 MG/100ML-% IV SOLN (PREMIX): 5.0000 mg/h | INTRAVENOUS | Status: DC

## 2017-04-14 MED ORDER — MAGNESIUM SULFATE 2 GM/50ML IV SOLN
2.0000 g | Freq: Once | INTRAVENOUS | Status: AC
  Administered 2017-04-15: 2 g via INTRAVENOUS
  Filled 2017-04-14: qty 50

## 2017-04-14 MED ORDER — DILTIAZEM HCL-DEXTROSE 100-5 MG/100ML-% IV SOLN (PREMIX)
INTRAVENOUS | Status: AC
  Filled 2017-04-14: qty 100

## 2017-04-14 MED ORDER — DILTIAZEM LOAD VIA INFUSION
20.0000 mg | Freq: Once | INTRAVENOUS | Status: DC
  Filled 2017-04-14: qty 20

## 2017-04-14 MED ORDER — ONDANSETRON HCL 4 MG/2ML IJ SOLN: 4.0000 mg | Freq: Four times a day (QID) | INTRAMUSCULAR | Status: DC | PRN

## 2017-04-14 NOTE — H&P (Signed)
History and Physical    Tamara Roberts:[id number] DOB: 1940/09/24 DOA: 04/14/2017  PCP: Kirstie Peri, MD   Patient coming from: Home.  I have personally briefly reviewed patient's old medical records in [facility]  Chief Complaint: High blood pressure and palpitations.  HPI: Tamara Roberts is a 77 y.o. female with medical history significant of shoulders and hips bursitis, diverticulosis/history of diverticulitis, lumbar DJD/spondylolisthesis at L4-L5 level, GERD, hypertension, hyperlipidemia, IBS, history of stroke who is coming to the emergency department with complaints of elevated blood pressure, headache, chest heaviness, pain under both arms that started around 2045 this evening.  The patient reports that her systolic blood pressure was in the 235-245 mmHg range at home.  Per patient, around 2030 in the evening she was lying down in bed when she felt the onset of sudden palpitations, dizziness, dyspnea, "top of the head" headache "that felt getting tighter and tighter", followed by chest heaviness with worsening dyspnea.  She did try to turn over her side in bed, but then started hurting on both of her arms.  So she subsequently called her daughter and was brought to the emergency department.  Once in the ER, she felt diaphoretic.  She was about to get a loading dose and infusion of Cardizem for an irregular heart rate of 149 bpm, but then her symptoms disappeared very quickly.  She denies fever, chills, rhinorrhea, sore throat, wheezing, productive cough, PND, orthopnea, pitting edema of the lower extremities, abdominal pain, nausea, emesis, diarrhea or constipation.  No melena or hematochezia.  No dysuria, frequency or hematuria.  No heat or cold intolerance.  No polyphagia, polyuria or polydipsia.  However, the patient drinks at least 3 cans of diet Dr. Reino Kent every day.  I informed her that each can contains 41 mg of caffeine and this might be a factor with this  arrhythmia.  ED Course: Initial vital signs temperature 36.4C (97.6 F), pulse 141, respiration 20, blood pressure 173/71 mmHg and O2 sat 100% on room air.  A Cardizem and infusion were ordered in the ED, but the patient A. fib spontaneously converted to sinus rhythm and her blood pressure has nearly normalized.  I have ordered K Dur 40 mEq x1 and magnesium sulfate 2 g IVPB.  Her white count was 7.8, hemoglobin 13.8 g/dL and platelets 811.  Potassium 3.4, sodium 144, chloride 107 and CO2 22 mmol/L.  BUN was 16, creatinine 1.20, glucose 107, calcium 9.5, magnesium 2.0 and phosphorus 2.7 mg/dL.  Her TSH was 5.672 U/mL.  Initial troponin was negative, EKG #1 show atrial fibrillation with RVR at 149 bpm with repolarization abnormality and baseline wander on multiple leads.  EKG #2 history of a spontaneous conversion to normal sinus rhythm at 93 bpm with repolarization abnormality and baseline wander on II and V1.  Please see tracings for further detail.  Her chest radiograph show mild cardiomegaly and aortic atherosclerosis, but no edema or consolidation.  Please see image and full radiology report for further detail.  Review of Systems: As per HPI otherwise 10 point review of systems negative.   Past Medical History:  Diagnosis Date  . Bursitis    in shoulders and hips  . Complication of anesthesia    longer to wake up, been fine since no problems since  . Diverticulitis   . DJD (degenerative joint disease), lumbar   . GERD (gastroesophageal reflux disease)   . HTN (hypertension)   . Hyperlipidemia   . Irritable bowel syndrome   .  Spondylolisthesis at L4-L5 level   . Stroke [facility](HCC)     Past Surgical History:  Procedure Laterality Date  . ABDOMINAL HYSTERECTOMY    . APPENDECTOMY    . BREAST CYST ASPIRATION Left   . CHOLECYSTECTOMY    . HERNIA REPAIR    . LEFT HEART CATH AND CORONARY ANGIOGRAPHY N/A 04/01/2016   Procedure: Left Heart Cath and Coronary Angiography;  Surgeon: Peter M SwazilandJordan,  MD;  [facility];  Service: Cardiovascular;  Laterality: N/A;  . LUMBAR SPINE SURGERY    . right shoulder surgery    . tah and bso       reports that she has never smoked. She has never used smokeless tobacco. She reports that she does not drink alcohol or use drugs.  Allergies  Allergen Reactions  . Feldene [Piroxicam] Shortness Of Breath  . Codeine Nausea Only    Family History  Problem Relation Age of Onset  . Cancer Mother   . Diabetes Paternal Grandmother   . Heart attack Maternal Grandfather   . Heart disease Father   . Arthritis Father        crippling  . Alcoholism Brother   . Drug abuse Brother   . Congestive Heart Failure Daughter     Prior to Admission medications   Medication Sig Start Date End Date Taking? Authorizing Provider  aspirin EC 81 MG tablet Take 81 mg by mouth daily.   Yes [provider]  cholecalciferol (VITAMIN D) 1000 units tablet Take 1,000 Units by mouth daily.   Yes [provider]  clopidogrel (PLAVIX) 75 MG tablet Take 75 mg by mouth daily.   Yes [provider]  temazepam (RESTORIL) 30 MG capsule Take 30 mg by mouth at bedtime as needed for sleep.   Yes [provider]  traMADol (ULTRAM) 50 MG tablet Take 50 mg by mouth 3 (three) times daily as needed for moderate pain.   Yes [provider]    Physical Exam: Vitals:   04/14/17 2213 04/14/17 2214 04/14/17 2215 04/14/17 2230  BP:    125/84  Pulse: 90 87 87 91  Resp: 17 19 13 16   Temp:      TempSrc:      SpO2: 97% 97% 96% 95%  Weight:      Height:        Constitutional: NAD, calm, comfortable Eyes: PERRL, lids and conjunctivae normal ENMT: Mucous membranes are moist. Posterior pharynx clear of any exudate or lesions. Neck: normal, supple, no masses, no thyromegaly Respiratory: clear to auscultation bilaterally, no wheezing, no crackles. Normal respiratory effort. No accessory muscle use.  Cardiovascular: Regular rate and  rhythm, no murmurs / rubs / gallops. No extremity edema. 2+ pedal pulses. No carotid bruits.  Abdomen: Soft, no tenderness, no masses palpated. No hepatosplenomegaly. Bowel sounds positive.  Musculoskeletal: no clubbing / cyanosis. Good ROM, no contractures. Normal muscle tone.  Skin: no significant rashes, lesions, ulcers on limited dermatological examination Neurologic: CN 2-12 grossly intact. Sensation intact, DTR normal. Strength 5/5 in all 4.  Psychiatric: Normal judgment and insight. Alert and oriented x 4. Normal mood.    Labs on Admission: I have personally reviewed following labs and imaging studies  CBC: Recent Labs  Lab 04/14/17 2139  WBC 7.8  HGB 14.6  13.8  HCT 43.0  43.1  MCV 89.6  PLT 275   Basic Metabolic Panel: Recent Labs  Lab 04/14/17 2139  NA 144  144  K 3.4*  3.4*  CL 107  109  CO2 22  GLUCOSE 107*  103*  BUN 16  15  CREATININE 1.20*  1.20*  CALCIUM 9.5   GFR: Estimated Creatinine Clearance: 36 mL/min (A) (by C-G formula based on SCr of 1.2 mg/dL (H)). Liver Function Tests: No results for input(s): AST, ALT, ALKPHOS, BILITOT, PROT, ALBUMIN in the last 168 hours. No results for input(s): LIPASE, AMYLASE in the last 168 hours. No results for input(s): AMMONIA in the last 168 hours. Coagulation Profile: No results for input(s): INR, PROTIME in the last 168 hours. Cardiac Enzymes: No results for input(s): CKTOTAL, CKMB, CKMBINDEX, TROPONINI in the last 168 hours. BNP (last 3 results) No results for input(s): PROBNP in the last 8760 hours. HbA1C: No results for input(s): HGBA1C in the last 72 hours. CBG: No results for input(s): GLUCAP in the last 168 hours. Lipid Profile: No results for input(s): CHOL, HDL, LDLCALC, TRIG, CHOLHDL, LDLDIRECT in the last 72 hours. Thyroid Function Tests: Recent Labs    04/14/17 2203  TSH 5.672*   Anemia Panel: No results for input(s): VITAMINB12, FOLATE, FERRITIN, TIBC, IRON, RETICCTPCT in the last 72  hours. Urine analysis:    Component Value Date/Time   COLORURINE YELLOW 04/01/2016 0515   APPEARANCEUR CLEAR 04/01/2016 0515   LABSPEC 1.010 04/01/2016 0515   PHURINE 6.0 04/01/2016 0515   GLUCOSEU NEGATIVE 04/01/2016 0515   HGBUR NEGATIVE 04/01/2016 0515   BILIRUBINUR NEGATIVE 04/01/2016 0515   KETONESUR NEGATIVE 04/01/2016 0515   PROTEINUR NEGATIVE 04/01/2016 0515   NITRITE NEGATIVE 04/01/2016 0515   LEUKOCYTESUR TRACE (A) 04/01/2016 0515    Radiological Exams on Admission: Dg Chest Port 1 View  Result Date: 04/14/2017 CLINICAL DATA:  Chest pain and hypertension EXAM: PORTABLE CHEST 1 VIEW COMPARISON:  December 15, 2016 FINDINGS: There is no edema or consolidation. Heart is mildly enlarged with pulmonary vascularity within normal limits. There is aortic atherosclerosis. No adenopathy. No bone lesions. IMPRESSION: Aortic atherosclerosis. Mild cardiac enlargement. No edema or consolidation. Aortic Atherosclerosis (ICD10-I70.0). Electronically Signed   By: Bretta Bang III M.D.   On: 04/14/2017 22:00   04/01/2016 echocardiogram complete  ------------------------------------------------------------------- LV EF: 65% -   70%  ------------------------------------------------------------------- Indications:      Chest pain 786.51.  ------------------------------------------------------------------- History:   PMH:   Stroke.  Risk factors:  Hypertension. Dyslipidemia.  ------------------------------------------------------------------- Study Conclusions  - Left ventricle: The cavity size was normal. Wall thickness was   increased in a pattern of moderate LVH. Systolic function was   vigorous. The estimated ejection fraction was in the range of 65%   to 70%. Diastolic function is abnormal, indeterminate grade. Wall   motion was normal; there were no regional wall motion   abnormalities. - Aortic valve: Valve area (VTI): 1.96 cm^2. Valve area (Vmax):   1.82 cm^2. -  Technically adequate study.  EKG: Independently reviewed. EKG #1 Vent. rate 149 BPM PR interval * ms QRS duration 79 ms QT/QTc 276/435 ms P-R-T axes * 33 230 Atrial fibrillation with rapid V-rate Consider posterior infarct Repolarization abnormality, prob rate related Baseline wander in lead(s) II III aVF   EKG #2 Vent. rate 93 BPM PR interval * ms QRS duration 100 ms QT/QTc 399/497 ms P-R-T axes 43 25 26 Sinus rhythm Abnormal R-wave progression, early transition Nonspecific repol abnormality, diffuse leads Baseline wander in lead(s) II V1  Assessment/Plan Principal Problem:   Atrial fibrillation with rapid ventricular response (HCC) CHA?DS?-VASc Score of at least 6. Keep electrolytes optimized (mag over 2.0  mg/dL and K over 4.0 mmol/L) Start low-dose metoprolol succinate 25 mg p.o. daily. Metoprolol tartrate 12.5 mg p.o. x1 was given on admission. Increased risk of stroke and elbow score results discussed with the patient. Single full dose Lovenox SQ was administer on admission. Please discuss anticoagulation again with the patient later today and prior to discharge. Obtain echocardiogram in the morning. Even if A. fib does not recur during this stay, she will be seen by cardiology as an outpatient. I have advised her to cease or decrease substantially her caffeine intake.  Active Problems:   Essential hypertension Given 12.5 mg of metoprolol tartrate x1 dose. Metoprolol succinate 25 mg p.o. daily will be started later this morning. Monitor blood pressure and heart rate.    History of stroke Continue Plavix and aspirin. A single full dose Lovenox given on admission. If the patient is discharged on oral anticoagulant, consider discontinuing antiplatelet therapy.    Hypokalemia Replacing. Follow-up potassium level. Magnesium has been supplemented. She is often hypokalemic, so will start low-dose daily oral replacement.    Renal insufficiency Monitor renal  function and electrolytes.    S/P lumbar spinal fusion  Continue tramadol as needed.    GERD (gastroesophageal reflux disease) Pantoprazole 40 mg p.o. Daily.    DVT prophylaxis: A single full dose Lovenox given. Code Status: Full code. Family Communication:  Disposition Plan: Observation for 24-48 hours for further workup and treatment. Consults called:  Admission status: Observation/telemetry.   Bobette Mo MD [facility] Pager [phone].  If 7PM-7AM, please contact night-coverage [email or web address] Password [facility]  04/14/2017, 11:07 PM

## 2017-04-14 NOTE — ED Triage Notes (Signed)
Patient reports elevated BP, headache, pain under both arms. Chest heaviness that started 45 minutes PTA. Patient does not have slurred speech, moving all extremities. Family reports BP at home 245/235.

## 2017-04-14 NOTE — ED Provider Notes (Signed)
[facility] EMERGENCY DEPARTMENT Provider Note   CSN: [id number] Arrival date & time: 04/14/17  2127     History   Chief Complaint Chief Complaint  Patient presents with  . Chest Pain    HPI Tamara Roberts is a 77 y.o. female.  Pt presents to the ED today with cp and elevated bp.  The pt said around 2030, she felt like her heart was "flying away."  She felt cp and heaviness in both arms.  She checked her bp and it was elevated.  Pt has never had this happen in the past.  CHA2DS2/VAS Stroke Risk Points:  6 2 for age 72 for sex 1 for htn 2 prior stroke       Past Medical History:  Diagnosis Date  . Bursitis    in shoulders and hips  . Complication of anesthesia    longer to wake up, been fine since no problems since  . Diverticulitis   . DJD (degenerative joint disease), lumbar   . GERD (gastroesophageal reflux disease)   . HTN (hypertension)   . Hyperlipidemia   . Irritable bowel syndrome   . Spondylolisthesis at L4-L5 level   . Stroke [facility])     Patient Active Problem List   Diagnosis Date Noted  . Atrial fibrillation with rapid ventricular response (HCC) 04/14/2017  . GERD (gastroesophageal reflux disease) 04/14/2017  . S/P lumbar spinal fusion 08/25/2016  . Chest pain 04/02/2016  . Hypokalemia 04/02/2016  . Renal insufficiency 04/02/2016  . History of stroke 04/01/2016  . Dyslipidemia 09/26/2008  . Essential hypertension 09/26/2008  . DJD (degenerative joint disease), lumbar 09/26/2008    Past Surgical History:  Procedure Laterality Date  . ABDOMINAL HYSTERECTOMY    . APPENDECTOMY    . BREAST CYST ASPIRATION Left   . CHOLECYSTECTOMY    . HERNIA REPAIR    . LEFT HEART CATH AND CORONARY ANGIOGRAPHY N/A 04/01/2016   Procedure: Left Heart Cath and Coronary Angiography;  Surgeon: Peter M Swaziland, MD;  Location: [facility] INVASIVE CV LAB;  Service: Cardiovascular;  Laterality: N/A;  . LUMBAR SPINE SURGERY    . right shoulder surgery    . tah and bso        OB History    Gravida  4   Para      Term      Preterm      AB      Living  4     SAB      TAB      Ectopic      Multiple      Live Births               Home Medications    Prior to Admission medications   Medication Sig Start Date End Date Taking? Authorizing Provider  aspirin EC 81 MG tablet Take 81 mg by mouth daily.   Yes [provider]  cholecalciferol (VITAMIN D) 1000 units tablet Take 1,000 Units by mouth daily.   Yes [provider]  clopidogrel (PLAVIX) 75 MG tablet Take 75 mg by mouth daily.   Yes [provider]  temazepam (RESTORIL) 30 MG capsule Take 30 mg by mouth at bedtime as needed for sleep.   Yes [provider]  traMADol (ULTRAM) 50 MG tablet Take 50 mg by mouth 3 (three) times daily as needed for moderate pain.   Yes [provider]    Family History Family History  Problem Relation Age of Onset  .  Cancer Mother   . Diabetes Paternal Grandmother   . Heart attack Maternal Grandfather   . Heart disease Father   . Arthritis Father        crippling  . Alcoholism Brother   . Drug abuse Brother   . Congestive Heart Failure Daughter     Social History Social History   Tobacco Use  . Smoking status: Never Smoker  . Smokeless tobacco: Never Used  Substance Use Topics  . Alcohol use: No  . Drug use: No     Allergies   Feldene [piroxicam] and Codeine   Review of Systems Review of Systems  Cardiovascular: Positive for chest pain and palpitations.  All other systems reviewed and are negative.    Physical Exam Updated Vital Signs BP 125/84   Pulse 91   Temp 97.6 F (36.4 C) (Oral)   Resp 16   Ht 5' (1.524 m)   Wt 72.6 kg (160 lb)   SpO2 95%   BMI 31.25 kg/m   Physical Exam  Constitutional: She is oriented to person, place, and time. She appears well-developed and well-nourished.  HENT:  Head: Normocephalic and atraumatic.  Eyes: Pupils are equal, round, and  reactive to light. EOM are normal.  Neck: Normal range of motion. Neck supple.  Cardiovascular: Intact distal pulses and normal pulses. An irregularly irregular rhythm present. Tachycardia present.  Pulmonary/Chest: Effort normal and breath sounds normal.  Musculoskeletal: Normal range of motion.       Right lower leg: Normal.       Left lower leg: Normal.  Neurological: She is alert and oriented to person, place, and time.  Skin: Skin is warm and dry. Capillary refill takes less than 2 seconds.  Psychiatric: She has a normal mood and affect. Her behavior is normal.  Nursing note and vitals reviewed.    ED Treatments / Results  Labs (all labs ordered are listed, but only abnormal results are displayed) Labs Reviewed  BASIC METABOLIC PANEL - Abnormal; Notable for the following components:      Result Value   Potassium 3.4 (*)    Glucose, Bld 107 (*)    Creatinine, Ser 1.20 (*)    GFR calc non Af Amer 43 (*)    GFR calc Af Amer 50 (*)    All other components within normal limits  TSH - Abnormal; Notable for the following components:   TSH 5.672 (*)    All other components within normal limits  I-STAT CHEM 8, ED - Abnormal; Notable for the following components:   Potassium 3.4 (*)    Creatinine, Ser 1.20 (*)    Glucose, Bld 103 (*)    Calcium, Ion 1.09 (*)    All other components within normal limits  CBC  MAGNESIUM  PHOSPHORUS  BRAIN NATRIURETIC PEPTIDE  TROPONIN I  TROPONIN I  COMPREHENSIVE METABOLIC PANEL  I-STAT TROPONIN, ED    EKG EKG Interpretation  Date/Time:  Friday April 14 2017 21:49:41 EDT Ventricular Rate:  93 PR Interval:    QRS Duration: 100 QT Interval:  399 QTC Calculation: 497 R Axis:   25 Text Interpretation:  Sinus rhythm Abnormal R-wave progression, early transition Nonspecific repol abnormality, diffuse leads Baseline wander in lead(s) II V1 spontaneous conversion to nsr Confirmed by Jacalyn Lefevre [phone]) on 04/14/2017 9:55:22  PM   Radiology Dg Chest Port 1 View  Result Date: 04/14/2017 CLINICAL DATA:  Chest pain and hypertension EXAM: PORTABLE CHEST 1 VIEW COMPARISON:  December 15, 2016 FINDINGS: There  is no edema or consolidation. Heart is mildly enlarged with pulmonary vascularity within normal limits. There is aortic atherosclerosis. No adenopathy. No bone lesions. IMPRESSION: Aortic atherosclerosis. Mild cardiac enlargement. No edema or consolidation. Aortic Atherosclerosis (ICD10-I70.0). Electronically Signed   By: Bretta BangWilliam  Woodruff III M.D.   On: 04/14/2017 22:00    Procedures Procedures (including critical care time)  Medications Ordered in ED Medications  diltiazem (CARDIZEM) 1 mg/mL load via infusion 20 mg (20 mg Intravenous Not Given 04/14/17 2146)    And  diltiazem (CARDIZEM) 100 mg in dextrose 5% 100mL (1 mg/mL) infusion (5 mg/hr Intravenous Not Given 04/14/17 2146)  potassium chloride SA (K-DUR,KLOR-CON) CR tablet 40 mEq (has no administration in time range)  magnesium sulfate IVPB 2 g 50 mL (has no administration in time range)  0.9 % NaCl with KCl 40 mEq / L  infusion (has no administration in time range)  ondansetron (ZOFRAN) tablet 4 mg (has no administration in time range)    Or  ondansetron (ZOFRAN) injection 4 mg (has no administration in time range)  acetaminophen (TYLENOL) tablet 650 mg (has no administration in time range)    Or  acetaminophen (TYLENOL) suppository 650 mg (has no administration in time range)     Initial Impression / Assessment and Plan / ED Course  I have reviewed the triage vital signs and the nursing notes.  Pertinent labs & imaging results that were available during my care of the patient were reviewed by me and considered in my medical decision making (see chart for details).  CRITICAL CARE Performed by: Jacalyn LefevreJulie Shantell Belongia   Total critical care time: 30 minutes  Critical care time was exclusive of separately billable procedures and treating other  patients.  Critical care was necessary to treat or prevent imminent or life-threatening deterioration.  Critical care was time spent personally by me on the following activities: development of treatment plan with patient and/or surrogate as well as nursing, discussions with consultants, evaluation of patient's response to treatment, examination of patient, obtaining history from patient or surrogate, ordering and performing treatments and interventions, ordering and review of laboratory studies, ordering and review of radiographic studies, pulse oximetry and re-evaluation of patient's condition.    HR up to 180 in the room.  Monitor shows afib with rvr.  Cardizem ordered, but pt spontaneously converted without any intervention.  Pt d/w Dr. Tenny Crawoss (cardiology) who recommended admission over night.  Pt will need to be started on a beta blocker and anticoagulant.  Ok to stay at [facility].   Cath in March of 2018 showed minimal nonobstructive CAD.  Pt d/w Dr. Robb Matarrtiz ([facility]) for admission.  Final Clinical Impressions(s) / ED Diagnoses   Final diagnoses:  Atrial fibrillation with rapid ventricular response (HCC)  CKD (chronic kidney disease) stage 3, GFR 30-59 ml/min [facility](HCC)    ED Discharge Orders    None       Jacalyn LefevreHaviland, Clarke Amburn, MD 04/14/17 2322

## 2017-04-15 ENCOUNTER — Other Ambulatory Visit: Payer: Self-pay

## 2017-04-15 ENCOUNTER — Observation Stay (HOSPITAL_BASED_OUTPATIENT_CLINIC_OR_DEPARTMENT_OTHER): Payer: PPO

## 2017-04-15 DIAGNOSIS — N183 Chronic kidney disease, stage 3 (moderate): Secondary | ICD-10-CM | POA: Diagnosis not present

## 2017-04-15 DIAGNOSIS — I10 Essential (primary) hypertension: Secondary | ICD-10-CM | POA: Diagnosis not present

## 2017-04-15 DIAGNOSIS — Z8673 Personal history of transient ischemic attack (TIA), and cerebral infarction without residual deficits: Secondary | ICD-10-CM | POA: Diagnosis not present

## 2017-04-15 DIAGNOSIS — I340 Nonrheumatic mitral (valve) insufficiency: Secondary | ICD-10-CM | POA: Diagnosis not present

## 2017-04-15 DIAGNOSIS — Z7982 Long term (current) use of aspirin: Secondary | ICD-10-CM | POA: Diagnosis not present

## 2017-04-15 DIAGNOSIS — I129 Hypertensive chronic kidney disease with stage 1 through stage 4 chronic kidney disease, or unspecified chronic kidney disease: Secondary | ICD-10-CM | POA: Diagnosis not present

## 2017-04-15 DIAGNOSIS — I4891 Unspecified atrial fibrillation: Secondary | ICD-10-CM

## 2017-04-15 DIAGNOSIS — Z79899 Other long term (current) drug therapy: Secondary | ICD-10-CM | POA: Diagnosis not present

## 2017-04-15 DIAGNOSIS — K219 Gastro-esophageal reflux disease without esophagitis: Secondary | ICD-10-CM | POA: Diagnosis not present

## 2017-04-15 DIAGNOSIS — Z981 Arthrodesis status: Secondary | ICD-10-CM | POA: Diagnosis not present

## 2017-04-15 LAB — COMPREHENSIVE METABOLIC PANEL
ALT: 12 U/L — ABNORMAL LOW (ref 14–54)
AST: 14 U/L — ABNORMAL LOW (ref 15–41)
Albumin: 3.3 g/dL — ABNORMAL LOW (ref 3.5–5.0)
Alkaline Phosphatase: 56 U/L (ref 38–126)
Anion gap: 10 (ref 5–15)
BILIRUBIN TOTAL: 0.4 mg/dL (ref 0.3–1.2)
BUN: 14 mg/dL (ref 6–20)
CHLORIDE: 112 mmol/L — AB (ref 101–111)
CO2: 21 mmol/L — ABNORMAL LOW (ref 22–32)
CREATININE: 1.01 mg/dL — AB (ref 0.44–1.00)
Calcium: 8.7 mg/dL — ABNORMAL LOW (ref 8.9–10.3)
GFR, EST NON AFRICAN AMERICAN: 53 mL/min — AB (ref >60)
Glucose, Bld: 90 mg/dL (ref 65–99)
Potassium: 4.3 mmol/L (ref 3.5–5.1)
Sodium: 143 mmol/L (ref 135–145)
TOTAL PROTEIN: 6.5 g/dL (ref 6.5–8.1)

## 2017-04-15 LAB — TROPONIN I

## 2017-04-15 LAB — ECHOCARDIOGRAM COMPLETE
HEIGHTINCHES: 60 in
Weight: 2571.45 oz

## 2017-04-15 MED ORDER — POTASSIUM CHLORIDE CRYS ER 10 MEQ PO TBCR: 10.0000 meq | EXTENDED_RELEASE_TABLET | Freq: Every day | ORAL | Status: DC

## 2017-04-15 MED ORDER — PANTOPRAZOLE SODIUM 40 MG PO TBEC
40.0000 mg | DELAYED_RELEASE_TABLET | Freq: Every day | ORAL | Status: DC
  Administered 2017-04-15: 40 mg via ORAL
  Filled 2017-04-15: qty 1

## 2017-04-15 MED ORDER — METOPROLOL SUCCINATE ER 25 MG PO TB24: 25.0000 mg | ORAL_TABLET | Freq: Every day | ORAL | 1 refills | Status: DC

## 2017-04-15 MED ORDER — ACETAMINOPHEN 325 MG PO TABS: 650.0000 mg | ORAL_TABLET | Freq: Four times a day (QID) | ORAL | Status: DC | PRN

## 2017-04-15 MED ORDER — ALPRAZOLAM 0.5 MG PO TABS
0.5000 mg | ORAL_TABLET | Freq: Every evening | ORAL | Status: DC | PRN
  Administered 2017-04-15: 0.5 mg via ORAL
  Filled 2017-04-15: qty 1

## 2017-04-15 MED ORDER — METOPROLOL TARTRATE 25 MG PO TABS
12.5000 mg | ORAL_TABLET | Freq: Once | ORAL | Status: AC
  Administered 2017-04-15: 12.5 mg via ORAL
  Filled 2017-04-15: qty 1

## 2017-04-15 MED ORDER — APIXABAN 5 MG PO TABS: 5.0000 mg | ORAL_TABLET | Freq: Two times a day (BID) | ORAL | 0 refills | Status: DC

## 2017-04-15 MED ORDER — CLOPIDOGREL BISULFATE 75 MG PO TABS
75.0000 mg | ORAL_TABLET | Freq: Every day | ORAL | Status: DC
  Administered 2017-04-15: 75 mg via ORAL
  Filled 2017-04-15: qty 1

## 2017-04-15 MED ORDER — ENOXAPARIN SODIUM 80 MG/0.8ML ~~LOC~~ SOLN
1.0000 mg/kg | Freq: Once | SUBCUTANEOUS | Status: AC
  Administered 2017-04-15: 75 mg via SUBCUTANEOUS
  Filled 2017-04-15: qty 0.8

## 2017-04-15 MED ORDER — VITAMIN D 1000 UNITS PO TABS
1000.0000 [IU] | ORAL_TABLET | Freq: Every day | ORAL | Status: DC
  Administered 2017-04-15: 1000 [IU] via ORAL
  Filled 2017-04-15: qty 1

## 2017-04-15 MED ORDER — ASPIRIN EC 81 MG PO TBEC
81.0000 mg | DELAYED_RELEASE_TABLET | Freq: Every day | ORAL | Status: DC
  Administered 2017-04-15: 81 mg via ORAL
  Filled 2017-04-15: qty 1

## 2017-04-15 MED ORDER — METOPROLOL SUCCINATE ER 25 MG PO TB24
25.0000 mg | ORAL_TABLET | Freq: Every day | ORAL | Status: DC
  Administered 2017-04-15: 25 mg via ORAL
  Filled 2017-04-15: qty 1

## 2017-04-15 MED ORDER — TRAMADOL HCL 50 MG PO TABS: 50.0000 mg | ORAL_TABLET | Freq: Three times a day (TID) | ORAL | Status: DC | PRN

## 2017-04-15 NOTE — Discharge Summary (Addendum)
Physician Discharge Summary  Tamara MannanCarolyn V Roberts WUJ:[id number] DOB: 08/10/1940 DOA: 04/14/2017  PCP: Kirstie PeriShah, Ashish, MD  Admit date: 04/14/2017 Discharge date: 04/15/2017  Time spent: >35 minutes  Recommendations for Outpatient Follow-up:  Cardiology follow up in 1-2 weeks PCP in 3-7 days as needed Discharge Diagnoses:  Principal Problem:   Atrial fibrillation with rapid ventricular response (HCC) Active Problems:   Essential hypertension   History of stroke   Hypokalemia   Renal insufficiency   S/P lumbar spinal fusion   GERD (gastroesophageal reflux disease)   Discharge Condition: stable   Diet recommendation: low sodium   Filed Weights   04/14/17 2133 04/15/17 0019  Weight: 72.6 kg (160 lb) 72.9 kg (160 lb 11.5 oz)    History of present illness:   77 y.o. female with medical history significant of shoulders and hips bursitis, diverticulosis/history of diverticulitis, lumbar DJD/spondylolisthesis at L4-L5 level, GERD, hypertension, hyperlipidemia, IBS, history of stroke who is coming to the emergency department with complaints of elevated blood pressure, headache, chest heaviness, pain under both arms that started around 2045 this evening.  The patient reports that her systolic blood pressure was in the 235-245 mmHg range at home.   ED Course: Initial vital signs temperature 36.4C (97.6 F), pulse 141, respiration 20, blood pressure 173/71 mmHg and O2 sat 100% on room air.  A Cardizem and infusion were ordered in the ED, but the patient A. fib spontaneously converted to sinus rhythm and her blood pressure has nearly normalized.  I have ordered K Dur 40 mEq x1 and magnesium sulfate 2 g IVPB.   Hospital Course:   Atrial fibrillation with rapid ventricular response (HCC). Troponin_ negative. Echo-pend. CHA?DS?-VASc Score of at least 6. D/w patient, her family at length regarding a fib, risk of stroke , anticoagulation risks and benefits in details. They have agreed to start  apixaban. Recommended to stop antiplatelets due to increased risk of bleeding.  started low-dose metoprolol succinate 25 mg p.o. Daily. She wanted to go home today. I have recommended to f/u with cardiology in 1-2 weeks, pend echo as well   Essential hypertension. Controlled on BB History of stroke. Started on apixaban prophylaxis due to a fib  Hypokalemia Replaced Renal insufficiency. mild chronic ckd I S/P lumbar spinal fusion Continue tramadol as needed. Mild elevated tsh at 5.6. Recheck in few weeks, with t4 as well   Procedures:  Echo-pend  (i.e. Studies not automatically included, echos, thoracentesis, etc; not x-rays)  Consultations:  none  Discharge Exam: Vitals:   04/15/17 0019 04/15/17 0551  BP: (!) 167/80 (!) 141/75  Pulse: 64 62  Resp:  16  Temp: 97.7 F (36.5 C) 97.6 F (36.4 C)  SpO2: 99% 100%    General: alert. No distress  Cardiovascular: s1,s2 rrr Respiratory: CTA BL  Discharge Instructions  Discharge Instructions    Diet - low sodium heart healthy   Complete by:  As directed    Discharge instructions   Complete by:  As directed    Please follow up with cardiologist in 1-2 weeks   Increase activity slowly   Complete by:  As directed      Allergies as of 04/15/2017      Reactions   Feldene [piroxicam] Shortness Of Breath   Codeine Nausea Only      Medication List    STOP taking these medications   aspirin EC 81 MG tablet   clopidogrel 75 MG tablet Commonly known as:  PLAVIX     TAKE these  medications   acetaminophen 325 MG tablet Commonly known as:  TYLENOL Take 2 tablets (650 mg total) by mouth every 6 (six) hours as needed for mild pain (or Fever >/= 101).   apixaban 5 MG Tabs tablet Commonly known as:  ELIQUIS Take 1 tablet (5 mg total) by mouth 2 (two) times daily.   cholecalciferol 1000 units tablet Commonly known as:  VITAMIN D Take 1,000 Units by mouth daily.   metoprolol succinate 25 MG 24 hr tablet Commonly known as:   TOPROL-XL Take 1 tablet (25 mg total) by mouth daily. Start taking on:  04/16/2017   temazepam 30 MG capsule Commonly known as:  RESTORIL Take 30 mg by mouth at bedtime as needed for sleep.   traMADol 50 MG tablet Commonly known as:  ULTRAM Take 50 mg by mouth 3 (three) times daily as needed for moderate pain.      Allergies  Allergen Reactions  . Feldene [Piroxicam] Shortness Of Breath  . Codeine Nausea Only      The results of significant diagnostics from this hospitalization (including imaging, microbiology, ancillary and laboratory) are listed below for reference.    Significant Diagnostic Studies: Dg Chest Port 1 View  Result Date: 04/14/2017 CLINICAL DATA:  Chest pain and hypertension EXAM: PORTABLE CHEST 1 VIEW COMPARISON:  December 15, 2016 FINDINGS: There is no edema or consolidation. Heart is mildly enlarged with pulmonary vascularity within normal limits. There is aortic atherosclerosis. No adenopathy. No bone lesions. IMPRESSION: Aortic atherosclerosis. Mild cardiac enlargement. No edema or consolidation. Aortic Atherosclerosis (ICD10-I70.0). Electronically Signed   By: Bretta Bang III M.D.   On: 04/14/2017 22:00    Microbiology: No results found for this or any previous visit (from the past 240 hour(s)).   Labs: Basic Metabolic Panel: Recent Labs  Lab 04/14/17 2139 04/15/17 0651  NA 144  144 143  K 3.4*  3.4* 4.3  CL 107  109 112*  CO2 22 21*  GLUCOSE 107*  103* 90  BUN 16  15 14   CREATININE 1.20*  1.20* 1.01*  CALCIUM 9.5 8.7*  MG 2.0  --   PHOS 2.7  --    Liver Function Tests: Recent Labs  Lab 04/15/17 0651  AST 14*  ALT 12*  ALKPHOS 56  BILITOT 0.4  PROT 6.5  ALBUMIN 3.3*   No results for input(s): LIPASE, AMYLASE in the last 168 hours. No results for input(s): AMMONIA in the last 168 hours. CBC: Recent Labs  Lab 04/14/17 2139  WBC 7.8  HGB 14.6  13.8  HCT 43.0  43.1  MCV 89.6  PLT 275   Cardiac Enzymes: Recent  Labs  Lab 04/15/17 0024 04/15/17 0651  TROPONINI <0.03 <0.03   BNP: BNP (last 3 results) Recent Labs    04/14/17 2139  BNP 225.0*    ProBNP (last 3 results) No results for input(s): PROBNP in the last 8760 hours.  CBG: No results for input(s): GLUCAP in the last 168 hours.     SignedEsperanza Sheets  [facility] 04/15/2017, 1:37 PM

## 2017-04-15 NOTE — ED Notes (Signed)
Please contact Netta Corriganracey Barber (daughter) with any questions or concerns regarding pt. [phone].

## 2017-04-15 NOTE — Progress Notes (Signed)
*  PRELIMINARY RESULTS* Echocardiogram 2D Echocardiogram has been performed.  [name], [name] 04/15/2017, 10:56 AM

## 2017-04-23 NOTE — Progress Notes (Signed)
Cardiology Office Note   Date:  04/23/2017   ID:  Tamara Roberts, DOB 13-Nov-1940, MRN [id number]  PCP:  Kirstie Peri, MD  Cardiologist:  Andrey Spearman chief complaint on file.    History of Present Illness: Tamara Roberts is a 77 y.o. female who presents for ongoing assessment and management of hypertension, hyperlipidemia, hx of CVA on DAPT with ASA and Plavix. Non-obstructive CAD per cath in 04/2016.Other history includes IBS, lumbar DJD with spondylolisthesis.  She was recently discharged from the hospital on 04/15/2017 after admission for afib with RVR heart rate of 141 with hypertension 173/71. She spontaneously converted to NSR on diltiazem gtt. She was also found to be hypokalemic and given po K-Dur and magnesium sulfate IV. She was started on Eliquis. Plavix was stopped due to increased risk of bleeding. Echocardiogram demonstrated normal EF with Grade II diastolic dysfunction. The LA was mildly to moderately dilated.   The patient is under considerable amount of stress as her husband is terminal from prostate cancer with metastases.  She has been taking care of him 24/7 and was exhausted and was lying down when her heart rate elevated and her blood pressure became out of control.  She is tolerating metoprolol now without any recurrence of her rapid heart rhythm.  Blood pressures currently well controlled.  She does take her blood pressure at home regularly.  She would like to be referred to a new PCP, and she is dissatisfied with her current provider.  Her daughter is a patient of Dr Gerda Diss.   Past Medical History:  Diagnosis Date  . Bursitis    in shoulders and hips  . Complication of anesthesia    longer to wake up, been fine since no problems since  . Diverticulitis   . DJD (degenerative joint disease), lumbar   . GERD (gastroesophageal reflux disease)   . HTN (hypertension)   . Hyperlipidemia   . Irritable bowel syndrome   . Spondylolisthesis at L4-L5 level   . Stroke  [facility]     Past Surgical History:  Procedure Laterality Date  . ABDOMINAL HYSTERECTOMY    . APPENDECTOMY    . BREAST CYST ASPIRATION Left   . CHOLECYSTECTOMY    . HERNIA REPAIR    . LEFT HEART CATH AND CORONARY ANGIOGRAPHY N/A 04/01/2016   Procedure: Left Heart Cath and Coronary Angiography;  Surgeon: Peter M Swaziland, MD;  Location: [facility] INVASIVE CV LAB;  Service: Cardiovascular;  Laterality: N/A;  . LUMBAR SPINE SURGERY    . right shoulder surgery    . tah and bso       Current Outpatient Medications  Medication Sig Dispense Refill  . acetaminophen (TYLENOL) 325 MG tablet Take 2 tablets (650 mg total) by mouth every 6 (six) hours as needed for mild pain (or Fever >/= 101).    Marland Kitchen apixaban (ELIQUIS) 5 MG TABS tablet Take 1 tablet (5 mg total) by mouth 2 (two) times daily. 60 tablet 0  . cholecalciferol (VITAMIN D) 1000 units tablet Take 1,000 Units by mouth daily.    . metoprolol succinate (TOPROL-XL) 25 MG 24 hr tablet Take 1 tablet (25 mg total) by mouth daily. 30 tablet 1  . temazepam (RESTORIL) 30 MG capsule Take 30 mg by mouth at bedtime as needed for sleep.    . traMADol (ULTRAM) 50 MG tablet Take 50 mg by mouth 3 (three) times daily as needed for moderate pain.     No current facility-administered medications for this visit.  Allergies:   Feldene [piroxicam] and Codeine    Social History:  The patient  reports that she has never smoked. She has never used smokeless tobacco. She reports that she does not drink alcohol or use drugs.   Family History:  The patient's family history includes Alcoholism in her brother; Arthritis in her father; Cancer in her mother; Congestive Heart Failure in her daughter; Diabetes in her paternal grandmother; Drug abuse in her brother; Heart attack in her maternal grandfather; Heart disease in her father.    ROS: All other systems are reviewed and negative. Unless otherwise mentioned in H&P    PHYSICAL EXAM: VS:  There were no vitals taken for  this visit. , BMI There is no height or weight on file to calculate BMI. GEN: Well nourished, well developed, in no acute distress  HEENT: normal  Neck: no JVD, carotid bruits, or masses Cardiac: RRR; no murmurs, rubs, or gallops,no edema  Respiratory:  clear to auscultation bilaterally, normal work of breathing GI: soft, nontender, nondistended, + BS MS: no deformity or atrophy  Skin: warm and dry, no rash Neuro:  Strength and sensation are intact Psych: euthymic mood, full affect   EKG:  NSR with non-specific T-wave abnormalities.  Rate of 62 bpm.   Recent Labs: 04/14/2017: B Natriuretic Peptide 225.0; Hemoglobin 13.8; Hemoglobin 14.6; Magnesium 2.0; Platelets 275; TSH 5.672 04/15/2017: ALT 12; BUN 14; Creatinine, Ser 1.01; Potassium 4.3; Sodium 143    Lipid Panel    Component Value Date/Time   CHOL 246 (H) 04/01/2016 0447   TRIG 273 (H) 04/01/2016 0447   HDL 55 04/01/2016 0447   CHOLHDL 4.5 04/01/2016 0447   VLDL 55 (H) 04/01/2016 0447   LDLCALC 136 (H) 04/01/2016 0447      Wt Readings from Last 3 Encounters:  04/15/17 160 lb 11.5 oz (72.9 kg)  08/25/16 169 lb (76.7 kg)  08/23/16 169 lb 3.2 oz (76.7 kg)      Other studies Reviewed: Cardiac Cath 04/01/2016 Conclusion     Prox RCA lesion, 15 %stenosed.  The left ventricular systolic function is normal.  LV end diastolic pressure is normal.  The left ventricular ejection fraction is 55-65% by visual estimate.   1. Minimal nonobstructive CAD 2. Normal LV function 3. Normal LVEDP  Plan: consider noncardiac causes of chest pain.   Echocardiogram 04/15/2017  Left ventricle: The cavity size was normal. Systolic function was   normal. The estimated ejection fraction was in the range of 55%   to 60%. Wall motion was normal; there were no regional wall   motion abnormalities. Features are consistent with a pseudonormal   left ventricular filling pattern, with concomitant abnormal   relaxation and increased  filling pressure (grade 2 diastolic   dysfunction). - Mitral valve: There was mild to moderate regurgitation directed   centrally. - Left atrium: The atrium was mildly to moderately dilated.   ASSESSMENT AND PLAN:  1.  New onset atrial fib with RVR: Documented during recent ER visit, the patient was converted to the ER visit to normal sinus rhythm.  She states this occurred prior to being given any medicine.  She was placed on metoprolol succinate, 25 mg daily.  She was also started on apixaban 5 mg twice daily.  She is tolerating this medication well without bleeding fatigue or recurrent irregular heart rhythm or fast heart rate.  She states that she has been on metoprolol in the past but after having had cardiac catheterization this was discontinued.  She feels much better back on the beta-blocker.  I have recommended that if she has sudden bursts of rapid heart rhythm despite being on the metoprolol, she could take another dose of it.  She was still to be seen in ER so that an EKG could be completed and evaluate her rhythm if it does not subside on its own.    She is given samples of Eliquis 5 mg twice daily with new refills for metoprolol and Eliquis today.  As her atrial fibrillation is paroxysmal he will need close monitoring for recurrent symptoms 1 month.  No further cardiac testing is planned at this time.  2.  Chronic headaches: She has been placed on tramadol by PCP.  She only takes it once a day but continues to have headaches.  I have advised her that this is a every 8 hour medication which she can take more often than once a day.  3.  Situational stress and anxiety: Patient is currently caring for her terminally ill husband, which is causing her to lose a lot of sleep and be at a low level of stress daily.  The patient's husband is going to be seen by oncology this week.  I have advised her to talk with them about hospice to give her more help at home if this is indeed the case that  her husband is terminal.  4.  Hypertension: May consider adding lisinopril on next office visit to maintain better blood pressure control with grade 2 diastolic dysfunction.  She currently does not have evidence of volume overload indicative of diastolic heart failure on exam today.  Current medicines are reviewed at length with the patient today.   The patient the patient is requesting names of new PCP .  She would like to be seen by Sherie Don FNP, or Dr's Luking.  I have sent a referral.  If they are not accepting patients, [facility] will be notified and phone number will be given to her for new PCP in [facility].   Labs/ tests ordered today include: None.  Bettey Mare. Liborio Nixon, ANP, AACC   04/23/2017 4:57 PM    [facility] [address] [place], [place] [address] Phone: [phone]; Fax: [phone]

## 2017-04-24 ENCOUNTER — Encounter: Payer: Self-pay | Admitting: Adult Health

## 2017-04-24 ENCOUNTER — Ambulatory Visit (INDEPENDENT_AMBULATORY_CARE_PROVIDER_SITE_OTHER): Payer: PPO | Admitting: Adult Health

## 2017-04-24 VITALS — BP 132/78 | HR 62 | Ht 60.0 in | Wt 162.0 lb

## 2017-04-24 DIAGNOSIS — I480 Paroxysmal atrial fibrillation: Secondary | ICD-10-CM | POA: Diagnosis not present

## 2017-04-24 DIAGNOSIS — I4891 Unspecified atrial fibrillation: Secondary | ICD-10-CM

## 2017-04-24 DIAGNOSIS — I10 Essential (primary) hypertension: Secondary | ICD-10-CM | POA: Diagnosis not present

## 2017-04-24 DIAGNOSIS — R079 Chest pain, unspecified: Secondary | ICD-10-CM

## 2017-04-24 MED ORDER — METOPROLOL SUCCINATE ER 25 MG PO TB24: 25.0000 mg | ORAL_TABLET | Freq: Every day | ORAL | 1 refills | Status: DC

## 2017-04-24 MED ORDER — APIXABAN 5 MG PO TABS: 5.0000 mg | ORAL_TABLET | Freq: Two times a day (BID) | ORAL | 0 refills | Status: DC

## 2017-04-24 MED ORDER — TRAMADOL HCL 50 MG PO TABS: 50.0000 mg | ORAL_TABLET | Freq: Three times a day (TID) | ORAL | 2 refills | Status: DC | PRN

## 2017-04-24 MED ORDER — APIXABAN 5 MG PO TABS: 5.0000 mg | ORAL_TABLET | Freq: Two times a day (BID) | ORAL | 11 refills | Status: DC

## 2017-04-24 NOTE — Patient Instructions (Signed)
Medication Instructions:  NO CHANGES- Your physician recommends that you continue on your current medications as directed. Please refer to the Current Medication list given to you today.  If you need a refill on your cardiac medications before your next appointment, please call your pharmacy.  Special Instructions: TAKE AND LOG BP DAILY  REFER TO [facility], Lilyan PuntSCOTT, [place]  Follow-UP Your physician wants you to follow-up in: 1 MONTH WITH KATHRYN LAWRENCE (NURSE PRACTIONIER), DNP   Thank you for choosing [facility]!!

## 2017-04-25 ENCOUNTER — Other Ambulatory Visit (INDEPENDENT_AMBULATORY_CARE_PROVIDER_SITE_OTHER): Payer: PPO

## 2017-04-25 DIAGNOSIS — R079 Chest pain, unspecified: Secondary | ICD-10-CM

## 2017-04-25 DIAGNOSIS — I4891 Unspecified atrial fibrillation: Secondary | ICD-10-CM

## 2017-04-25 DIAGNOSIS — I10 Essential (primary) hypertension: Secondary | ICD-10-CM

## 2017-05-01 ENCOUNTER — Telehealth: Payer: Self-pay | Admitting: Adult Health

## 2017-05-01 DIAGNOSIS — K219 Gastro-esophageal reflux disease without esophagitis: Secondary | ICD-10-CM

## 2017-05-01 NOTE — Telephone Encounter (Signed)
Pt made aware it was a referral that was sent to Dr. Fletcher Anon office and that it was resent. Pt verbalized understanding no additional questions at this time.

## 2017-05-01 NOTE — Telephone Encounter (Signed)
I sent a staff message to Dr. Isabell Jarvis, NP who is there as well as an official referral . We are sending this again.

## 2017-05-01 NOTE — Telephone Encounter (Signed)
Pt calling and stated Dr Lyman Bishop said that she would call Dr Tamara Roberts office to get her in as a NP since she new him. Pt call Dr Cathlyn Parsons office and Dr Lyman Bishop hasn't spoke to them as of today. Pt would like to be advised what to do

## 2017-05-01 NOTE — Telephone Encounter (Signed)
REFERRAL SENT AGAIN

## 2017-05-01 NOTE — Telephone Encounter (Addendum)
Routed to K. Lawrence to review and get an update on process. Pt aware that message has been fowarded to her to review and we will get back with her once we know more.

## 2017-05-02 ENCOUNTER — Other Ambulatory Visit (HOSPITAL_COMMUNITY): Payer: Self-pay | Admitting: Internal Medicine

## 2017-05-02 ENCOUNTER — Ambulatory Visit (HOSPITAL_COMMUNITY)
Admission: RE | Admit: 2017-05-02 | Discharge: 2017-05-02 | Disposition: A | Discharge status: Home / Self Care | Payer: PPO | Source: Ambulatory Visit | Attending: Internal Medicine | Admitting: Internal Medicine

## 2017-05-02 DIAGNOSIS — Z299 Encounter for prophylactic measures, unspecified: Secondary | ICD-10-CM | POA: Diagnosis not present

## 2017-05-02 DIAGNOSIS — S2241XA Multiple fractures of ribs, right side, initial encounter for closed fracture: Secondary | ICD-10-CM | POA: Insufficient documentation

## 2017-05-02 DIAGNOSIS — T148XXA Other injury of unspecified body region, initial encounter: Secondary | ICD-10-CM

## 2017-05-02 DIAGNOSIS — W19XXXA Unspecified fall, initial encounter: Secondary | ICD-10-CM | POA: Diagnosis not present

## 2017-05-02 DIAGNOSIS — Z6830 Body mass index (BMI) 30.0-30.9, adult: Secondary | ICD-10-CM | POA: Diagnosis not present

## 2017-05-02 DIAGNOSIS — X58XXXA Exposure to other specified factors, initial encounter: Secondary | ICD-10-CM | POA: Diagnosis not present

## 2017-05-02 DIAGNOSIS — F329 Major depressive disorder, single episode, unspecified: Secondary | ICD-10-CM | POA: Diagnosis not present

## 2017-05-02 DIAGNOSIS — E7800 Pure hypercholesterolemia, unspecified: Secondary | ICD-10-CM | POA: Diagnosis not present

## 2017-05-02 DIAGNOSIS — R0789 Other chest pain: Secondary | ICD-10-CM | POA: Insufficient documentation

## 2017-05-02 DIAGNOSIS — I639 Cerebral infarction, unspecified: Secondary | ICD-10-CM | POA: Diagnosis not present

## 2017-05-02 DIAGNOSIS — I10 Essential (primary) hypertension: Secondary | ICD-10-CM | POA: Diagnosis not present

## 2017-05-02 DIAGNOSIS — S299XXA Unspecified injury of thorax, initial encounter: Secondary | ICD-10-CM | POA: Diagnosis not present

## 2017-05-08 ENCOUNTER — Telehealth: Payer: Self-pay | Admitting: Adult Health

## 2017-05-08 NOTE — Telephone Encounter (Signed)
Returned the call to the patient. She stated that last week she had a bad fall and bruised her right rib area. She was having severe pain. She saw her PCP and an xray was ordered. The xray showed that she did not have any broken bones. She does have a follow up appointment on Tuesday 5/7 with her PCP.   She is concerned because she is on Eliquis and therefore would like for Joni Reining, DNP to be aware of what has happened. Message has been routed.

## 2017-05-08 NOTE — Telephone Encounter (Signed)
New Message:      Pt is calling due to a bad fall on last week and pt states she is on blood thinners. Pt states she went to get an X-ray and they think she may have a broken rib and pt states she is hurting really bad. Pt states she has really bad bruising but because of the blood thinners she is on she is not sure what is going on. Pt states she has an appt with another dr on tues but she is not sure what he is going to do. Pt thinks she may have a blood clot.

## 2017-05-08 NOTE — Telephone Encounter (Signed)
Thank you for information. She is right to be concerned about the Eliquis with fall. She will likely have a lot of bruising. PCP can assess if she has more concerning symptoms on their exam.

## 2017-05-09 ENCOUNTER — Other Ambulatory Visit: Payer: Self-pay

## 2017-05-09 ENCOUNTER — Other Ambulatory Visit (HOSPITAL_COMMUNITY): Payer: Self-pay | Admitting: Internal Medicine

## 2017-05-09 ENCOUNTER — Telehealth: Payer: Self-pay

## 2017-05-09 ENCOUNTER — Ambulatory Visit (HOSPITAL_COMMUNITY)
Admission: RE | Admit: 2017-05-09 | Discharge: 2017-05-09 | Disposition: A | Discharge status: Home / Self Care | Payer: PPO | Source: Ambulatory Visit | Attending: Internal Medicine | Admitting: Internal Medicine

## 2017-05-09 ENCOUNTER — Other Ambulatory Visit (HOSPITAL_COMMUNITY)
Admission: RE | Admit: 2017-05-09 | Discharge: 2017-05-09 | Disposition: A | Discharge status: Home / Self Care | Payer: PPO | Source: Ambulatory Visit | Attending: Internal Medicine | Admitting: Internal Medicine

## 2017-05-09 DIAGNOSIS — Z6829 Body mass index (BMI) 29.0-29.9, adult: Secondary | ICD-10-CM | POA: Diagnosis not present

## 2017-05-09 DIAGNOSIS — Z981 Arthrodesis status: Secondary | ICD-10-CM | POA: Diagnosis not present

## 2017-05-09 DIAGNOSIS — Z299 Encounter for prophylactic measures, unspecified: Secondary | ICD-10-CM | POA: Diagnosis not present

## 2017-05-09 DIAGNOSIS — S3991XA Unspecified injury of abdomen, initial encounter: Secondary | ICD-10-CM | POA: Diagnosis not present

## 2017-05-09 DIAGNOSIS — R109 Unspecified abdominal pain: Secondary | ICD-10-CM | POA: Diagnosis not present

## 2017-05-09 DIAGNOSIS — I700 Atherosclerosis of aorta: Secondary | ICD-10-CM | POA: Insufficient documentation

## 2017-05-09 DIAGNOSIS — R1011 Right upper quadrant pain: Secondary | ICD-10-CM | POA: Insufficient documentation

## 2017-05-09 DIAGNOSIS — Z7689 Persons encountering health services in other specified circumstances: Secondary | ICD-10-CM

## 2017-05-09 DIAGNOSIS — R1010 Upper abdominal pain, unspecified: Secondary | ICD-10-CM

## 2017-05-09 DIAGNOSIS — I10 Essential (primary) hypertension: Secondary | ICD-10-CM | POA: Diagnosis not present

## 2017-05-09 DIAGNOSIS — R0789 Other chest pain: Secondary | ICD-10-CM | POA: Diagnosis not present

## 2017-05-09 LAB — COMPREHENSIVE METABOLIC PANEL
ALT: 13 U/L — ABNORMAL LOW (ref 14–54)
AST: 19 U/L (ref 15–41)
Albumin: 4.1 g/dL (ref 3.5–5.0)
Alkaline Phosphatase: 86 U/L (ref 38–126)
Anion gap: 11 (ref 5–15)
BUN: 18 mg/dL (ref 6–20)
CHLORIDE: 109 mmol/L (ref 101–111)
CO2: 24 mmol/L (ref 22–32)
CREATININE: 1.37 mg/dL — AB (ref 0.44–1.00)
Calcium: 9.3 mg/dL (ref 8.9–10.3)
GFR calc non Af Amer: 37 mL/min — ABNORMAL LOW (ref >60)
GFR, EST AFRICAN AMERICAN: 43 mL/min — AB (ref >60)
Glucose, Bld: 92 mg/dL (ref 65–99)
POTASSIUM: 4.1 mmol/L (ref 3.5–5.1)
SODIUM: 144 mmol/L (ref 135–145)
Total Bilirubin: 0.7 mg/dL (ref 0.3–1.2)
Total Protein: 7.6 g/dL (ref 6.5–8.1)

## 2017-05-09 LAB — CBC
HCT: 39.9 % (ref 36.0–46.0)
Hemoglobin: 12.9 g/dL (ref 12.0–15.0)
MCH: 29.3 pg (ref 26.0–34.0)
MCHC: 32.3 g/dL (ref 30.0–36.0)
MCV: 90.7 fL (ref 78.0–100.0)
PLATELETS: 230 10*3/uL (ref 150–400)
RBC: 4.4 MIL/uL (ref 3.87–5.11)
RDW: 13.3 % (ref 11.5–15.5)
WBC: 8.2 10*3/uL (ref 4.0–10.5)

## 2017-05-09 LAB — LIPASE, BLOOD: LIPASE: 34 U/L (ref 11–51)

## 2017-05-09 LAB — AMYLASE: AMYLASE: 69 U/L (ref 28–100)

## 2017-05-09 MED ORDER — IOPAMIDOL (ISOVUE-300) INJECTION 61%
80.0000 mL | Freq: Once | INTRAVENOUS | Status: AC | PRN
  Administered 2017-05-09: 100 mL via INTRAVENOUS

## 2017-05-09 NOTE — Telephone Encounter (Signed)
REFERRAL ORDERED TO [facility] FOR DR Delton See OR DR HARDEN  PER STAFF MESSAGE: Jodelle Gross, NP  Alyson Ingles, LPN Send referral to Dr. Delton See or Dr. [facility]. I am sorry they were not able to fit her in.  ----- Message -----  From: Alyson Ingles, LPN  Sent: 01/08/1094  1:43 PM  To: Jodelle Gross, NP  Subject: FW: Wrong office  What do we do now?  ----- Message -----  From: Gala Romney  Sent: 05/09/2017  8:05 AM  To: Alyson Ingles, LPN  Subject: RE: Wrong office                 Neither doctors are taking new patient .Per doctors they are sorry but they are not taking on any new patient at this time.  ----- Message -----  From: Alyson Ingles, LPN  Sent: 0/04/5407  1:57 PM  To: Gala Romney  Subject: RE: Wrong office                 Can you just schedule with Lilyan Punt, please  ----- Message -----  From: Gala Romney  Sent: 05/03/2017  3:31 PM  To: Gala Romney, Alyson Ingles, LPN  Subject: Wrong office                   This is not our patient . We are [facility]. Our doctors are Dr. Lilyan Punt, Dr. Ardyth Gal  ----- Message -----  From: Alyson Ingles, LPN  Sent: 08/13/9145  7:18 AM  To: [facility] Admin Pool, [facility] Clinical [facility]   We referred this pt to establish and put the wrong "refer for" on the referral. I tried to edit this when I noticed but, was unable. So, I thought that I would send a message to let you know.  Sorry for the confusion.  Thank you for your understanding,  [facility]

## 2017-05-09 NOTE — Progress Notes (Signed)
[facility], Our doctors are Dr. Lilyan Punt, Dr. Colin Ina NOT ACCEPTING NEW PT's SENDING NEW REFERRAL FOR DR's NELSON -OR- HARDEN

## 2017-05-09 NOTE — Telephone Encounter (Signed)
Patient made aware and will see her PCP today.

## 2017-05-22 NOTE — Progress Notes (Signed)
Cardiology Office Note   Date:  05/24/2017   ID:  Tamara Roberts, DOB [birth year], MRN [id number]  PCP:  Kirstie Peri, MD  Cardiologist: Diona Browner  Chief Complaint  Patient presents with  . Follow-up     History of Present Illness: Tamara Roberts is a 77 y.o. female who presents for ongoing assessment and management of hypertension, hyperlipidemia, hx of CVA on DAPT with Plavix and ASA, PAF and therefore Eliquis was started and Plavix discontinued, non-obstructive CAD per cath 04/2016. Other non-cardiac history of IBS, lumbar DJD, with spondylolisthesis. She was referred to PCP in [place] where she lives, to be established with them.   On last office visit, dated 04/24/2017 BP was elevated. Considered adding lisinopril at that time but she was upset due to caring for terminal husband at home. Will re-evaluate today. She was continued on Eliquis and metoprolol.   She has since been seen by PCP after falling in her kitchen. She has bruised her right lower ribs. She would still like to be referred to [facility] PCP. She has been under more stress due to husband's cancer and pneumonia, with recent week long hospitalization.   She has kept up with her BP's at home. They have been stable.   Past Medical History:  Diagnosis Date  . Bursitis    in shoulders and hips  . Complication of anesthesia    longer to wake up, been fine since no problems since  . Diverticulitis   . DJD (degenerative joint disease), lumbar   . GERD (gastroesophageal reflux disease)   . HTN (hypertension)   . Hyperlipidemia   . Irritable bowel syndrome   . Spondylolisthesis at L4-L5 level   . Stroke [facility])     Past Surgical History:  Procedure Laterality Date  . ABDOMINAL HYSTERECTOMY    . APPENDECTOMY    . BREAST CYST ASPIRATION Left   . CHOLECYSTECTOMY    . HERNIA REPAIR    . LEFT HEART CATH AND CORONARY ANGIOGRAPHY N/A 04/01/2016   Procedure: Left Heart Cath and Coronary Angiography;  Surgeon: Peter M Swaziland,  MD;  Location: [facility] INVASIVE CV LAB;  Service: Cardiovascular;  Laterality: N/A;  . LUMBAR SPINE SURGERY    . right shoulder surgery    . tah and bso       Current Outpatient Medications  Medication Sig Dispense Refill  . acetaminophen (TYLENOL) 325 MG tablet Take 2 tablets (650 mg total) by mouth every 6 (six) hours as needed for mild pain (or Fever >/= 101).    Marland Kitchen apixaban (ELIQUIS) 5 MG TABS tablet Take 1 tablet (5 mg total) by mouth 2 (two) times daily. 60 tablet 11  . cholecalciferol (VITAMIN D) 1000 units tablet Take 1,000 Units by mouth daily.    . metoprolol succinate (TOPROL-XL) 25 MG 24 hr tablet Take 1 tablet (25 mg total) by mouth daily. 30 tablet 1  . temazepam (RESTORIL) 30 MG capsule Take 30 mg by mouth at bedtime as needed for sleep.    . traMADol (ULTRAM) 50 MG tablet Take 1 tablet (50 mg total) by mouth 3 (three) times daily as needed for moderate pain. 30 tablet 2   No current facility-administered medications for this visit.     Allergies:   Feldene [piroxicam] and Codeine    Social History:  The patient  reports that she has never smoked. She has never used smokeless tobacco. She reports that she does not drink alcohol or use drugs.   Family History:  The patient's family history includes Alcoholism in her brother; Arthritis in her father; Cancer in her mother; Congestive Heart Failure in her daughter; Diabetes in her paternal grandmother; Drug abuse in her brother; Heart attack in her maternal grandfather; Heart disease in her father.    ROS: All other systems are reviewed and negative. Unless otherwise mentioned in H&P    PHYSICAL EXAM: VS:  BP 115/70   Pulse (!) 57   Ht 5' (1.524 m)   Wt 160 lb 6.4 oz (72.8 kg)   BMI 31.33 kg/m  , BMI Body mass index is 31.33 kg/m. GEN: Well nourished, well developed, in no acute distress  HEENT: normal  Neck: no JVD, carotid bruits, or masses Cardiac: RRR; no murmurs, rubs, or gallops,no edema  Respiratory:  Clear to  auscultation bilaterally, normal work of breathing GI: soft, nontender, nondistended, + BS MS: no deformity or atrophy  Skin: warm and dry, no rash Neuro:  Strength and sensation are intact Psych: euthymic mood, full affect   EKG:  Not completed this office visit.    Recent Labs: 04/14/2017: B Natriuretic Peptide 225.0; Magnesium 2.0; TSH 5.672 05/09/2017: ALT 13; BUN 18; Creatinine, Ser 1.37; Hemoglobin 12.9; Platelets 230; Potassium 4.1; Sodium 144    Lipid Panel    Component Value Date/Time   CHOL 246 (H) 04/01/2016 0447   TRIG 273 (H) 04/01/2016 0447   HDL 55 04/01/2016 0447   CHOLHDL 4.5 04/01/2016 0447   VLDL 55 (H) 04/01/2016 0447   LDLCALC 136 (H) 04/01/2016 0447      Wt Readings from Last 3 Encounters:  05/24/17 160 lb 6.4 oz (72.8 kg)  04/24/17 162 lb (73.5 kg)  04/15/17 160 lb 11.5 oz (72.9 kg)      Other studies Reviewed: Cardiac Cath 04/01/2016 Conclusion     Prox RCA lesion, 15 %stenosed.  The left ventricular systolic function is normal.  LV end diastolic pressure is normal.  The left ventricular ejection fraction is 55-65% by visual estimate.  1. Minimal nonobstructive CAD 2. Normal LV function 3. Normal LVEDP  Plan: consider noncardiac causes of chest pain.   Echocardiogram 04/15/2017  Left ventricle: The cavity size was normal. Systolic function was normal. The estimated ejection fraction was in the range of 55% to 60%. Wall motion was normal; there were no regional wall motion abnormalities. Features are consistent with a pseudonormal left ventricular filling pattern, with concomitant abnormal relaxation and increased filling pressure (grade 2 diastolic dysfunction). - Mitral valve: There was mild to moderate regurgitation directed centrally. - Left atrium: The atrium was mildly to moderately dilated.   ASSESSMENT AND PLAN:  1. CAD: She is currently asymptomatic from cardiac standpoint. She is medically compliant.  Will continue current regimen.   2. Hypertension: She is doing well. I have reviewed her BP's and they are stable. Continue current medication regimen.   3.  Frequent Falls: She has had lumbar surgery in the past by Dr. Yetta Barre due to frequent falls due to leg weakness. This is occurring again. She is going to call him for follow up.    Current medicines are reviewed at length with the patient today.  [facility] physicians in [facility] are not accepting new patients in [place]. We have inquired to other practices in the area.   Labs/ tests ordered today include: None  Tamara Roberts, ANP, AACC   05/24/2017 2:02 PM    [facility] [address], [place], [place] [address] Phone: [phone]; Fax: [phone]

## 2017-05-24 ENCOUNTER — Encounter: Payer: Self-pay | Admitting: Adult Health

## 2017-05-24 ENCOUNTER — Ambulatory Visit (INDEPENDENT_AMBULATORY_CARE_PROVIDER_SITE_OTHER): Payer: PPO | Admitting: Adult Health

## 2017-05-24 VITALS — BP 115/70 | HR 57 | Ht 60.0 in | Wt 160.4 lb

## 2017-05-24 DIAGNOSIS — I2510 Atherosclerotic heart disease of native coronary artery without angina pectoris: Secondary | ICD-10-CM | POA: Diagnosis not present

## 2017-05-24 DIAGNOSIS — I10 Essential (primary) hypertension: Secondary | ICD-10-CM

## 2017-05-24 NOTE — Patient Instructions (Signed)
Medication Instructions:  NO CHANGES- Your physician recommends that you continue on your current medications as directed. Please refer to the Current Medication list given to you today.  If you need a refill on your cardiac medications before your next appointment, please call your pharmacy.  Special Instructions: [facility]  Address: [address] [place], [place] [address] Phone: [phone]  Follow-Up: Your physician wants you to follow-up in: 6 MONTHS WITH KATHRYN LAWRENCE (NURSE PRACTIONIER), DNP,AACC IF PRIMARY.  You should receive a reminder letter in the mail two months in advance. If you do not receive a letter, please call our office SEPT 2019 to schedule the NOV 2019 follow-up appointment.   Thank you for choosing [facility] at [facility]!!

## 2017-07-12 ENCOUNTER — Other Ambulatory Visit: Payer: Self-pay | Admitting: Adult Health

## 2017-08-18 ENCOUNTER — Other Ambulatory Visit: Payer: Self-pay | Admitting: Adult Health

## 2017-08-22 DIAGNOSIS — E7800 Pure hypercholesterolemia, unspecified: Secondary | ICD-10-CM | POA: Diagnosis not present

## 2017-08-22 DIAGNOSIS — Z299 Encounter for prophylactic measures, unspecified: Secondary | ICD-10-CM | POA: Diagnosis not present

## 2017-08-22 DIAGNOSIS — F419 Anxiety disorder, unspecified: Secondary | ICD-10-CM | POA: Diagnosis not present

## 2017-08-22 DIAGNOSIS — Z6830 Body mass index (BMI) 30.0-30.9, adult: Secondary | ICD-10-CM | POA: Diagnosis not present

## 2017-08-22 DIAGNOSIS — M810 Age-related osteoporosis without current pathological fracture: Secondary | ICD-10-CM | POA: Diagnosis not present

## 2017-08-22 DIAGNOSIS — I10 Essential (primary) hypertension: Secondary | ICD-10-CM | POA: Diagnosis not present

## 2017-09-05 DIAGNOSIS — R1084 Generalized abdominal pain: Secondary | ICD-10-CM | POA: Diagnosis not present

## 2017-09-05 DIAGNOSIS — F419 Anxiety disorder, unspecified: Secondary | ICD-10-CM | POA: Diagnosis not present

## 2017-09-05 DIAGNOSIS — I10 Essential (primary) hypertension: Secondary | ICD-10-CM | POA: Diagnosis not present

## 2017-09-05 DIAGNOSIS — Z6829 Body mass index (BMI) 29.0-29.9, adult: Secondary | ICD-10-CM | POA: Diagnosis not present

## 2017-09-05 DIAGNOSIS — I639 Cerebral infarction, unspecified: Secondary | ICD-10-CM | POA: Diagnosis not present

## 2017-09-05 DIAGNOSIS — Z299 Encounter for prophylactic measures, unspecified: Secondary | ICD-10-CM | POA: Diagnosis not present

## 2017-09-27 ENCOUNTER — Other Ambulatory Visit: Payer: Self-pay | Admitting: Internal Medicine

## 2017-09-27 DIAGNOSIS — Z1231 Encounter for screening mammogram for malignant neoplasm of breast: Secondary | ICD-10-CM

## 2017-10-06 DIAGNOSIS — F419 Anxiety disorder, unspecified: Secondary | ICD-10-CM | POA: Diagnosis not present

## 2017-10-06 DIAGNOSIS — E7800 Pure hypercholesterolemia, unspecified: Secondary | ICD-10-CM | POA: Diagnosis not present

## 2017-10-06 DIAGNOSIS — Z6830 Body mass index (BMI) 30.0-30.9, adult: Secondary | ICD-10-CM | POA: Diagnosis not present

## 2017-10-06 DIAGNOSIS — Z7189 Other specified counseling: Secondary | ICD-10-CM | POA: Diagnosis not present

## 2017-10-06 DIAGNOSIS — Z23 Encounter for immunization: Secondary | ICD-10-CM | POA: Diagnosis not present

## 2017-10-06 DIAGNOSIS — Z1211 Encounter for screening for malignant neoplasm of colon: Secondary | ICD-10-CM | POA: Diagnosis not present

## 2017-10-06 DIAGNOSIS — Z1331 Encounter for screening for depression: Secondary | ICD-10-CM | POA: Diagnosis not present

## 2017-10-06 DIAGNOSIS — R5383 Other fatigue: Secondary | ICD-10-CM | POA: Diagnosis not present

## 2017-10-06 DIAGNOSIS — Z1339 Encounter for screening examination for other mental health and behavioral disorders: Secondary | ICD-10-CM | POA: Diagnosis not present

## 2017-10-06 DIAGNOSIS — Z0000 Encounter for general adult medical examination without abnormal findings: Secondary | ICD-10-CM | POA: Diagnosis not present

## 2017-10-06 DIAGNOSIS — Z299 Encounter for prophylactic measures, unspecified: Secondary | ICD-10-CM | POA: Diagnosis not present

## 2017-10-12 ENCOUNTER — Encounter: Payer: Self-pay | Admitting: Cardiology

## 2017-10-12 DIAGNOSIS — E7800 Pure hypercholesterolemia, unspecified: Secondary | ICD-10-CM | POA: Diagnosis not present

## 2017-10-12 DIAGNOSIS — Z79899 Other long term (current) drug therapy: Secondary | ICD-10-CM | POA: Diagnosis not present

## 2017-10-12 DIAGNOSIS — F419 Anxiety disorder, unspecified: Secondary | ICD-10-CM | POA: Diagnosis not present

## 2017-10-12 DIAGNOSIS — R5383 Other fatigue: Secondary | ICD-10-CM | POA: Diagnosis not present

## 2017-10-12 DIAGNOSIS — E559 Vitamin D deficiency, unspecified: Secondary | ICD-10-CM | POA: Diagnosis not present

## 2017-10-24 ENCOUNTER — Ambulatory Visit
Admission: RE | Admit: 2017-10-24 | Discharge: 2017-10-24 | Disposition: A | Discharge status: Home / Self Care | Payer: PPO | Source: Ambulatory Visit | Attending: Internal Medicine | Admitting: Internal Medicine

## 2017-10-24 ENCOUNTER — Other Ambulatory Visit: Payer: Self-pay | Admitting: Adult Health

## 2017-10-24 DIAGNOSIS — Z1231 Encounter for screening mammogram for malignant neoplasm of breast: Secondary | ICD-10-CM

## 2017-10-25 NOTE — Telephone Encounter (Signed)
Please have her follow up with PCP for refills. Cardiology is not going to manage pain now. It is best if PCP follow this so she can be referred if there are other issues.

## 2017-11-16 DIAGNOSIS — E2839 Other primary ovarian failure: Secondary | ICD-10-CM | POA: Diagnosis not present

## 2017-11-17 ENCOUNTER — Encounter: Payer: Self-pay | Admitting: Cardiology

## 2017-11-17 NOTE — Progress Notes (Deleted)
Cardiology Office Note  Date: 11/17/2017   ID: NESA DISTEL, DOB [birth year], MRN [id number]  PCP: Kirstie Peri, MD  Primary Cardiologist: Nona Dell, MD   No chief complaint on file.   History of Present Illness: Tamara Roberts is a 77 y.o. female that I have not seen in the office since 2010.  She has had cardiology follow-up most recently with Ms. Liborio Nixon in May.  I reviewed her records and updated the chart.  Past Medical History:  Diagnosis Date  . Bursitis   . Diverticulitis   . DJD (degenerative joint disease), lumbar   . Essential hypertension   . GERD (gastroesophageal reflux disease)   . History of cardiac catheterization    March 2018 - minimal atherosclerosis  . Hyperlipidemia   . Irritable bowel syndrome   . Paroxysmal atrial fibrillation [facility])    Diagnosed April 2019  . Spondylolisthesis at L4-L5 level   . Stroke [facility])     Past Surgical History:  Procedure Laterality Date  . ABDOMINAL HYSTERECTOMY    . APPENDECTOMY    . BILATERAL SALPINGOOPHORECTOMY    . BREAST CYST ASPIRATION Left   . CHOLECYSTECTOMY    . HERNIA REPAIR    . LEFT HEART CATH AND CORONARY ANGIOGRAPHY N/A 04/01/2016   Procedure: Left Heart Cath and Coronary Angiography;  Surgeon: Peter M Swaziland, MD;  Location: [facility] INVASIVE CV LAB;  Service: Cardiovascular;  Laterality: N/A;  . LUMBAR SPINE SURGERY    . SHOULDER SURGERY Right     Current Outpatient Medications  Medication Sig Dispense Refill  . acetaminophen (TYLENOL) 325 MG tablet Take 2 tablets (650 mg total) by mouth every 6 (six) hours as needed for mild pain (or Fever >/= 101).    Marland Kitchen apixaban (ELIQUIS) 5 MG TABS tablet Take 1 tablet (5 mg total) by mouth 2 (two) times daily. 60 tablet 11  . cholecalciferol (VITAMIN D) 1000 units tablet Take 1,000 Units by mouth daily.    . metoprolol succinate (TOPROL-XL) 25 MG 24 hr tablet TAKE ONE TABLET BY MOUTH ONCE DAILY. 30 tablet 8  . temazepam (RESTORIL) 30 MG capsule Take 30  mg by mouth at bedtime as needed for sleep.    . traMADol (ULTRAM) 50 MG tablet Take 1 tablet (50 mg total) by mouth 3 (three) times daily as needed for moderate pain. 30 tablet 2   No current facility-administered medications for this visit.    Allergies:  Feldene [piroxicam] and Codeine   Social History: The patient  reports that she has never smoked. She has never used smokeless tobacco. She reports that she does not drink alcohol or use drugs.   Family History: The patient's family history includes Alcoholism in her brother; Arthritis in her father; Cancer in her mother; Congestive Heart Failure in her daughter; Diabetes in her paternal grandmother; Drug abuse in her brother; Heart attack in her maternal grandfather; Heart disease in her father.   ROS:  Please see the history of present illness. Otherwise, complete review of systems is positive for {NONE DEFAULTED:18576::"none"}.  All other systems are reviewed and negative.   Physical Exam: VS:  There were no vitals taken for this visit., BMI There is no height or weight on file to calculate BMI.  Wt Readings from Last 3 Encounters:  05/24/17 160 lb 6.4 oz (72.8 kg)  04/24/17 162 lb (73.5 kg)  04/15/17 160 lb 11.5 oz (72.9 kg)    General: Patient appears comfortable at rest. HEENT: Conjunctiva  and lids normal, oropharynx clear with moist mucosa. Neck: Supple, no elevated JVP or carotid bruits, no thyromegaly. Lungs: Clear to auscultation, nonlabored breathing at rest. Cardiac: Regular rate and rhythm, no S3 or significant systolic murmur, no pericardial rub. Abdomen: Soft, nontender, no hepatomegaly, bowel sounds present, no guarding or rebound. Extremities: No pitting edema, distal pulses 2+. Skin: Warm and dry. Musculoskeletal: No kyphosis. Neuropsychiatric: Alert and oriented x3, affect grossly appropriate.  ECG: I personally reviewed the tracing from 04/24/2017 which showed sinus rhythm with nonspecific ST changes.  Recent  Labwork: 04/14/2017: B Natriuretic Peptide 225.0; Magnesium 2.0; TSH 5.672 05/09/2017: ALT 13; AST 19; BUN 18; Creatinine, Ser 1.37; Hemoglobin 12.9; Platelets 230; Potassium 4.1; Sodium 144     Component Value Date/Time   CHOL 246 (H) 04/01/2016 0447   TRIG 273 (H) 04/01/2016 0447   HDL 55 04/01/2016 0447   CHOLHDL 4.5 04/01/2016 0447   VLDL 55 (H) 04/01/2016 0447   LDLCALC 136 (H) 04/01/2016 0447    Other Studies Reviewed Today:  Cardiac catheterization 04/01/2016:  Prox RCA lesion, 15 %stenosed.  The left ventricular systolic function is normal.  LV end diastolic pressure is normal.  The left ventricular ejection fraction is 55-65% by visual estimate.   1. Minimal nonobstructive CAD 2. Normal LV function 3. Normal LVEDP  Echocardiogram 04/15/2017: Study Conclusions  - Left ventricle: The cavity size was normal. Systolic function was   normal. The estimated ejection fraction was in the range of 55%   to 60%. Wall motion was normal; there were no regional wall   motion abnormalities. Features are consistent with a pseudonormal   left ventricular filling pattern, with concomitant abnormal   relaxation and increased filling pressure (grade 2 diastolic   dysfunction). - Mitral valve: There was mild to moderate regurgitation directed   centrally. - Left atrium: The atrium was mildly to moderately dilated.  Assessment and Plan:   Current medicines were reviewed with the patient today.  No orders of the defined types were placed in this encounter.   Disposition:  Signed, Jonelle SidleSamuel G. McDowell, MD, [facility] 11/17/2017 12:46 PM    [facility] [address], [place], [place] [address] Phone: [phone]; Fax: [phone]

## 2017-11-20 ENCOUNTER — Ambulatory Visit: Payer: PPO | Admitting: Cardiology

## 2017-11-23 DIAGNOSIS — E7800 Pure hypercholesterolemia, unspecified: Secondary | ICD-10-CM | POA: Diagnosis not present

## 2017-11-23 DIAGNOSIS — I10 Essential (primary) hypertension: Secondary | ICD-10-CM | POA: Diagnosis not present

## 2017-11-23 DIAGNOSIS — M1712 Unilateral primary osteoarthritis, left knee: Secondary | ICD-10-CM | POA: Diagnosis not present

## 2017-11-23 DIAGNOSIS — Z6830 Body mass index (BMI) 30.0-30.9, adult: Secondary | ICD-10-CM | POA: Diagnosis not present

## 2017-11-23 DIAGNOSIS — Z299 Encounter for prophylactic measures, unspecified: Secondary | ICD-10-CM | POA: Diagnosis not present

## 2017-11-27 ENCOUNTER — Encounter: Payer: Self-pay | Admitting: *Deleted

## 2017-11-27 ENCOUNTER — Encounter: Payer: Self-pay | Admitting: Cardiology

## 2017-11-27 ENCOUNTER — Ambulatory Visit (INDEPENDENT_AMBULATORY_CARE_PROVIDER_SITE_OTHER): Payer: PPO | Admitting: Cardiology

## 2017-11-27 VITALS — BP 124/80 | HR 64 | Ht 60.0 in | Wt 161.6 lb

## 2017-11-27 DIAGNOSIS — I10 Essential (primary) hypertension: Secondary | ICD-10-CM

## 2017-11-27 DIAGNOSIS — E782 Mixed hyperlipidemia: Secondary | ICD-10-CM | POA: Diagnosis not present

## 2017-11-27 DIAGNOSIS — I2510 Atherosclerotic heart disease of native coronary artery without angina pectoris: Secondary | ICD-10-CM | POA: Diagnosis not present

## 2017-11-27 DIAGNOSIS — I480 Paroxysmal atrial fibrillation: Secondary | ICD-10-CM

## 2017-11-27 MED ORDER — METOPROLOL SUCCINATE ER 25 MG PO TB24: 12.5000 mg | ORAL_TABLET | Freq: Every day | ORAL | 2 refills | Status: DC

## 2017-11-27 NOTE — Patient Instructions (Addendum)
Medication Instructions:   Your physician has recommended you make the following change in your medication:   Decrease your metoprolol succinate (Toprol XL) to 12.5 mg daily. Please break your 25 mg tablet in half daily.  Continue all other medications the same.  Labwork:  NONE  Testing/Procedures:  NONE  Follow-Up:  Your physician recommends that you schedule a follow-up appointment in: 6 months. You will receive a reminder letter in the mail in about 4 months reminding you to call and schedule your appointment. If you don't receive this letter, please contact our office.  Any Other Special Instructions Will Be Listed Below (If Applicable).  If you need a refill on your cardiac medications before your next appointment, please call your pharmacy.

## 2017-11-27 NOTE — Progress Notes (Signed)
Cardiology Office Note  Date: 11/27/2017   ID: Tamara Roberts, DOB 1940/02/15, MRN [id number]  PCP: Kirstie Peri, MD  Evaluating Cardiologist: Nona Dell, MD   Chief Complaint  Patient presents with  . Atrial Fibrillation    History of Present Illness: Tamara Roberts is a 77 y.o. female last seen by Ms. Lawrence DNP in May of this year and now establishing with me in the office.  Based on chart review I saw her approximately 9 years ago.  She is here today with her daughter.  I reviewed her history and updated the chart.  She is being followed at this time with paroxysmal atrial fibrillation, tolerating Eliquis without obvious bleeding problems.  She does complain of generalized fatigue, we discussed the possibility of decreasing her beta-blocker dose somewhat to see if this is helpful.  Also complains of arthritic pains, had a knee joint injection in PCPs office recently.  She has not seen an orthopedist.  She does not report any angina symptoms.  No recent palpitations.  States that she had lab work with PCP in the last month.  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  She was laid off at age 40 when the [employer] system took over.  I reviewed her current cardiac medications which include Eliquis, Toprol-XL, and Crestor.  Past Medical History:  Diagnosis Date  . Bursitis   . Diverticulitis   . DJD (degenerative joint disease), lumbar   . Essential hypertension   . GERD (gastroesophageal reflux disease)   . History of cardiac catheterization    March 2018 - minimal atherosclerosis  . Hyperlipidemia   . Irritable bowel syndrome   . Paroxysmal atrial fibrillation [facility])    Diagnosed April 2019  . Spondylolisthesis at L4-L5 level   . Stroke [facility])     Past Surgical History:  Procedure Laterality Date  . ABDOMINAL HYSTERECTOMY    . APPENDECTOMY    . BILATERAL SALPINGOOPHORECTOMY    . BREAST CYST ASPIRATION Left   . CHOLECYSTECTOMY     . HERNIA REPAIR    . LEFT HEART CATH AND CORONARY ANGIOGRAPHY N/A 04/01/2016   Procedure: Left Heart Cath and Coronary Angiography;  Surgeon: Peter M Swaziland, MD;  Location: [facility] INVASIVE CV LAB;  Service: Cardiovascular;  Laterality: N/A;  . LUMBAR SPINE SURGERY    . SHOULDER SURGERY Right     Current Outpatient Medications  Medication Sig Dispense Refill  . ALPRAZolam (XANAX) 0.5 MG tablet Take 0.5 mg by mouth at bedtime.    Marland Kitchen apixaban (ELIQUIS) 5 MG TABS tablet Take 1 tablet (5 mg total) by mouth 2 (two) times daily. 60 tablet 11  . cholecalciferol (VITAMIN D) 1000 units tablet Take 1,000 Units by mouth daily.    . metoprolol succinate (TOPROL-XL) 25 MG 24 hr tablet Take 0.5 tablets (12.5 mg total) by mouth daily. 45 tablet 2  . rosuvastatin (CRESTOR) 5 MG tablet Take 5 mg by mouth daily.    . traMADol (ULTRAM) 50 MG tablet Take 1 tablet (50 mg total) by mouth 3 (three) times daily as needed for moderate pain. 30 tablet 2   No current facility-administered medications for this visit.    Allergies:  Feldene [piroxicam] and Codeine   Social History: The patient  reports that she has never smoked. She has never used smokeless tobacco. She reports that she does not drink alcohol or use drugs.   Family History: The patient's family history includes Alcoholism in her brother;  Arthritis in her father; Cancer in her mother; Congestive Heart Failure in her daughter; Diabetes in her paternal grandmother; Drug abuse in her brother; Heart attack in her maternal grandfather; Heart disease in her father.   ROS:  Please see the history of present illness. Otherwise, complete review of systems is positive for chronic arthritic pains.  All other systems are reviewed and negative.   Physical Exam: VS:  BP 124/80   Pulse 64   Ht 5' (1.524 m)   Wt 161 lb 9.6 oz (73.3 kg)   SpO2 98%   BMI 31.56 kg/m , BMI Body mass index is 31.56 kg/m.  Wt Readings from Last 3 Encounters:  11/27/17 161 lb 9.6 oz  (73.3 kg)  05/24/17 160 lb 6.4 oz (72.8 kg)  04/24/17 162 lb (73.5 kg)    General: Elderly woman, appears comfortable at rest. HEENT: Conjunctiva and lids normal, oropharynx clear. Neck: Supple, no elevated JVP or carotid bruits, no thyromegaly. Lungs: Clear to auscultation, nonlabored breathing at rest. Cardiac: Regular rate and rhythm, no S3, soft systolic murmur. Abdomen: Soft, nontender, bowel sounds present. Extremities: No pitting edema, distal pulses 2+. Skin: Warm and dry. Musculoskeletal: No kyphosis. Neuropsychiatric: Alert and oriented x3, affect grossly appropriate.  ECG: I personally reviewed the tracing from 04/24/2017 which showed normal sinus rhythm with nonspecific ST changes.  Recent Labwork: 04/14/2017: B Natriuretic Peptide 225.0; Magnesium 2.0; TSH 5.672 05/09/2017: ALT 13; AST 19; BUN 18; Creatinine, Ser 1.37; Hemoglobin 12.9; Platelets 230; Potassium 4.1; Sodium 144     Component Value Date/Time   CHOL 246 (H) 04/01/2016 0447   TRIG 273 (H) 04/01/2016 0447   HDL 55 04/01/2016 0447   CHOLHDL 4.5 04/01/2016 0447   VLDL 55 (H) 04/01/2016 0447   LDLCALC 136 (H) 04/01/2016 0447    Other Studies Reviewed Today:  Echocardiogram 09/15/2017: Study Conclusions  - Left ventricle: The cavity size was normal. Systolic function was   normal. The estimated ejection fraction was in the range of 55%   to 60%. Wall motion was normal; there were no regional wall   motion abnormalities. Features are consistent with a pseudonormal   left ventricular filling pattern, with concomitant abnormal   relaxation and increased filling pressure (grade 2 diastolic   dysfunction). - Mitral valve: There was mild to moderate regurgitation directed   centrally. - Left atrium: The atrium was mildly to moderately dilated.  Cardiac catheterization 04/01/2016:  Prox RCA lesion, 15 %stenosed.  The left ventricular systolic function is normal.  LV end diastolic pressure is normal.  The  left ventricular ejection fraction is 55-65% by visual estimate.   1. Minimal nonobstructive CAD 2. Normal LV function 3. Normal LVEDP  Assessment and Plan:  1.  Paroxysmal atrial fibrillation, CHADSVASC score is 7.  She continues on Eliquis for stroke prophylaxis, we are requesting interval lab work from PCP.  No reported bleeding episodes.  Reduce Toprol-XL to 12.5 mg daily to see if this positively impacts her fatigue.  2.  Minor coronary atherosclerosis documented at cardiac catheterization in March 2018.  She is not on aspirin given concurrent use of Eliquis.  Continue Crestor.  3.  Hyperlipidemia on Crestor.  Interval lab work being requested from PCP.  4.  Essential hypertension by history, blood pressure is normal today.  Current medicines were reviewed with the patient today.  Disposition: Follow-up in 6 months.  Signed, Jonelle SidleSamuel G. McDowell, MD, [facility] 11/27/2017 4:32 PM    [facility]  [place], [place], [place] [address] Phone: [phone]; Fax: [phone]

## 2017-12-06 DIAGNOSIS — B029 Zoster without complications: Secondary | ICD-10-CM | POA: Diagnosis not present

## 2017-12-06 DIAGNOSIS — Z299 Encounter for prophylactic measures, unspecified: Secondary | ICD-10-CM | POA: Diagnosis not present

## 2017-12-06 DIAGNOSIS — F419 Anxiety disorder, unspecified: Secondary | ICD-10-CM | POA: Diagnosis not present

## 2017-12-06 DIAGNOSIS — Z6829 Body mass index (BMI) 29.0-29.9, adult: Secondary | ICD-10-CM | POA: Diagnosis not present

## 2017-12-06 DIAGNOSIS — E7800 Pure hypercholesterolemia, unspecified: Secondary | ICD-10-CM | POA: Diagnosis not present

## 2017-12-06 DIAGNOSIS — I10 Essential (primary) hypertension: Secondary | ICD-10-CM | POA: Diagnosis not present

## 2018-01-02 ENCOUNTER — Encounter (HOSPITAL_COMMUNITY): Payer: Self-pay

## 2018-01-02 ENCOUNTER — Emergency Department (HOSPITAL_COMMUNITY)
Admission: EM | Admit: 2018-01-02 | Discharge: 2018-01-02 | Disposition: A | Discharge status: Home / Self Care | Payer: PPO | Attending: Emergency Medicine | Admitting: Emergency Medicine

## 2018-01-02 ENCOUNTER — Ambulatory Visit (HOSPITAL_COMMUNITY): Payer: PPO

## 2018-01-02 ENCOUNTER — Other Ambulatory Visit: Payer: Self-pay

## 2018-01-02 DIAGNOSIS — G44209 Tension-type headache, unspecified, not intractable: Secondary | ICD-10-CM | POA: Diagnosis not present

## 2018-01-02 DIAGNOSIS — Z7901 Long term (current) use of anticoagulants: Secondary | ICD-10-CM | POA: Diagnosis not present

## 2018-01-02 DIAGNOSIS — I10 Essential (primary) hypertension: Secondary | ICD-10-CM | POA: Diagnosis not present

## 2018-01-02 DIAGNOSIS — R51 Headache: Secondary | ICD-10-CM | POA: Diagnosis not present

## 2018-01-02 DIAGNOSIS — K5792 Diverticulitis of intestine, part unspecified, without perforation or abscess without bleeding: Secondary | ICD-10-CM | POA: Diagnosis not present

## 2018-01-02 DIAGNOSIS — I480 Paroxysmal atrial fibrillation: Secondary | ICD-10-CM | POA: Diagnosis not present

## 2018-01-02 DIAGNOSIS — R441 Visual hallucinations: Secondary | ICD-10-CM | POA: Diagnosis not present

## 2018-01-02 DIAGNOSIS — Z79899 Other long term (current) drug therapy: Secondary | ICD-10-CM | POA: Diagnosis not present

## 2018-01-02 DIAGNOSIS — R443 Hallucinations, unspecified: Secondary | ICD-10-CM | POA: Diagnosis not present

## 2018-01-02 DIAGNOSIS — K5732 Diverticulitis of large intestine without perforation or abscess without bleeding: Secondary | ICD-10-CM | POA: Diagnosis not present

## 2018-01-02 LAB — CBC
HCT: 41.6 % (ref 36.0–46.0)
HEMOGLOBIN: 13.1 g/dL (ref 12.0–15.0)
MCH: 28.8 pg (ref 26.0–34.0)
MCHC: 31.5 g/dL (ref 30.0–36.0)
MCV: 91.4 fL (ref 80.0–100.0)
PLATELETS: 258 10*3/uL (ref 150–400)
RBC: 4.55 MIL/uL (ref 3.87–5.11)
RDW: 13.2 % (ref 11.5–15.5)
WBC: 5.9 10*3/uL (ref 4.0–10.5)
nRBC: 0 % (ref 0.0–0.2)

## 2018-01-02 LAB — BASIC METABOLIC PANEL
Anion gap: 8 (ref 5–15)
BUN: 17 mg/dL (ref 8–23)
CALCIUM: 9.1 mg/dL (ref 8.9–10.3)
CHLORIDE: 108 mmol/L (ref 98–111)
CO2: 23 mmol/L (ref 22–32)
Creatinine, Ser: 1.2 mg/dL — ABNORMAL HIGH (ref 0.44–1.00)
GFR calc non Af Amer: 44 mL/min — ABNORMAL LOW (ref >60)
GFR, EST AFRICAN AMERICAN: 51 mL/min — AB (ref >60)
Glucose, Bld: 90 mg/dL (ref 70–99)
Potassium: 4.2 mmol/L (ref 3.5–5.1)
SODIUM: 139 mmol/L (ref 135–145)

## 2018-01-02 LAB — HEPATIC FUNCTION PANEL
ALK PHOS: 59 U/L (ref 38–126)
ALT: 11 U/L (ref 0–44)
AST: 15 U/L (ref 15–41)
Albumin: 3.8 g/dL (ref 3.5–5.0)
TOTAL PROTEIN: 7.5 g/dL (ref 6.5–8.1)
Total Bilirubin: 0.5 mg/dL (ref 0.3–1.2)

## 2018-01-02 LAB — URINALYSIS, ROUTINE W REFLEX MICROSCOPIC
BACTERIA UA: NONE SEEN
Bilirubin Urine: NEGATIVE
GLUCOSE, UA: NEGATIVE mg/dL
Hgb urine dipstick: NEGATIVE
Ketones, ur: NEGATIVE mg/dL
NITRITE: NEGATIVE
PH: 7 (ref 5.0–8.0)
Protein, ur: NEGATIVE mg/dL
SPECIFIC GRAVITY, URINE: 1.006 (ref 1.005–1.030)

## 2018-01-02 LAB — LIPASE, BLOOD: LIPASE: 35 U/L (ref 11–51)

## 2018-01-02 LAB — TROPONIN I

## 2018-01-02 MED ORDER — METOCLOPRAMIDE HCL 5 MG/ML IJ SOLN
10.0000 mg | Freq: Once | INTRAMUSCULAR | Status: AC
  Administered 2018-01-02: 10 mg via INTRAVENOUS
  Filled 2018-01-02: qty 2

## 2018-01-02 MED ORDER — AMOXICILLIN-POT CLAVULANATE 875-125 MG PO TABS
1.0000 | ORAL_TABLET | Freq: Once | ORAL | Status: AC
  Administered 2018-01-02: 1 via ORAL
  Filled 2018-01-02: qty 1

## 2018-01-02 MED ORDER — AMOXICILLIN-POT CLAVULANATE 875-125 MG PO TABS: 1.0000 | ORAL_TABLET | Freq: Two times a day (BID) | ORAL | 0 refills | Status: DC

## 2018-01-02 MED ORDER — SODIUM CHLORIDE 0.9 % IV BOLUS
1000.0000 mL | Freq: Once | INTRAVENOUS | Status: AC
  Administered 2018-01-02: 1000 mL via INTRAVENOUS

## 2018-01-02 MED ORDER — IOHEXOL 300 MG/ML  SOLN
75.0000 mL | Freq: Once | INTRAMUSCULAR | Status: AC | PRN
  Administered 2018-01-02: 75 mL via INTRAVENOUS

## 2018-01-02 MED ORDER — LORAZEPAM 2 MG/ML IJ SOLN
0.5000 mg | Freq: Once | INTRAMUSCULAR | Status: AC
Start: 2018-01-02 — End: 2018-01-02
  Administered 2018-01-02: 0.5 mg via INTRAVENOUS
  Filled 2018-01-02: qty 1

## 2018-01-02 NOTE — ED Notes (Signed)
Pt states her headache is getting much worse.

## 2018-01-02 NOTE — Discharge Instructions (Addendum)
You were seen in the emergency department for headache and visual hallucinations.  You also were worried that you might have your diverticulitis acting up again.  You had a CAT scan of your head and abdomen along with blood work.  You received some medication that improved her symptoms.  You do have signs of diverticulitis and we are starting you on some antibiotics.  It will be important for you to follow-up with your doctor.  It sounds like you are under a lot of stress at home and will also be important for you to take care of yourself and get plenty of rest.  Please return to the emergency department if any worsening symptoms.

## 2018-01-02 NOTE — ED Triage Notes (Addendum)
Pt woke up this morning and was hallucinating. Thought her house was full of people when there was nobody there. States she is having a very bad headache. Daughter states she called her this morning, but pt doesn't remember calling her. No history of anything like this happening before. Pt is under a lot of stress at home

## 2018-01-02 NOTE — ED Provider Notes (Signed)
[facility] EMERGENCY DEPARTMENT Provider Note   CSN: [id number] Arrival date & time: 01/02/18  1133     History   Chief Complaint Chief Complaint  Patient presents with  . Hallucinations    HPI Tamara Roberts is a 77 y.o. female.  Has a history of A. fib on Eliquis.  She is complaining of hallucinating seeing people in her house when she woke up this morning.  She is very tearful about this.  She then is complaining of a severe throbbing headache.  She has never had this before.  She denies any falls.  No blurry vision or double vision no numbness or weakness.  No chest pain no shortness of breath.  She has had some low abdominal crampy pain for a few weeks and she thinks it is her diverticulitis acting up.  She is been constipated and no blood from the rectum.  No urinary symptoms.  Said she is been taking her medications regularly.  Family states she has been very stressed and is taking care of her ailing husband who has cancer.  They said she is never had hallucinations before 1 of the daughter said she had a "light stroke" a few years ago when she was confused.  The history is provided by the patient.  Altered Mental Status   This is a new problem. The current episode started 3 to 5 hours ago. The problem has not changed since onset.Associated symptoms include hallucinations. Her past medical history is significant for CVA.  Headache   This is a new problem. The current episode started 3 to 5 hours ago. The problem occurs constantly. The problem has not changed since onset.The headache is associated with an unknown factor. The pain is located in the left unilateral and right unilateral region. The quality of the pain is described as throbbing. The pain is severe. The pain does not radiate. Pertinent negatives include no fever, no chest pressure, no near-syncope, no palpitations, no syncope, no shortness of breath, no nausea and no vomiting. She has tried nothing for the symptoms. The  treatment provided no relief.    Past Medical History:  Diagnosis Date  . Bursitis   . Diverticulitis   . DJD (degenerative joint disease), lumbar   . Essential hypertension   . GERD (gastroesophageal reflux disease)   . History of cardiac catheterization    March 2018 - minimal atherosclerosis  . Hyperlipidemia   . Irritable bowel syndrome   . Paroxysmal atrial fibrillation [facility](HCC)    Diagnosed April 2019  . Spondylolisthesis at L4-L5 level   . Stroke [facility](HCC)     Patient Active Problem List   Diagnosis Date Noted  . Atrial fibrillation with rapid ventricular response (HCC) 04/14/2017  . GERD (gastroesophageal reflux disease) 04/14/2017  . S/P lumbar spinal fusion 08/25/2016  . Chest pain 04/02/2016  . Hypokalemia 04/02/2016  . Renal insufficiency 04/02/2016  . History of stroke 04/01/2016  . Dyslipidemia 09/26/2008  . Essential hypertension 09/26/2008  . DJD (degenerative joint disease), lumbar 09/26/2008    Past Surgical History:  Procedure Laterality Date  . ABDOMINAL HYSTERECTOMY    . APPENDECTOMY    . BILATERAL SALPINGOOPHORECTOMY    . BREAST CYST ASPIRATION Left   . CHOLECYSTECTOMY    . HERNIA REPAIR    . LEFT HEART CATH AND CORONARY ANGIOGRAPHY N/A 04/01/2016   Procedure: Left Heart Cath and Coronary Angiography;  Surgeon: Peter M SwazilandJordan, MD;  [facility] INV[facility];  Service: Cardiovascular;  Laterality:  N/A;  . LUMBAR SPINE SURGERY    . SHOULDER SURGERY Right      OB History    Gravida  4   Para      Term      Preterm      AB      Living  4     SAB      TAB      Ectopic      Multiple      Live Births               Home Medications    Prior to Admission medications   Medication Sig Start Date End Date Taking? Authorizing Provider  ALPRAZolam Prudy Feeler) 0.5 MG tablet Take 0.5 mg by mouth at bedtime.    [provider]  apixaban (ELIQUIS) 5 MG TABS tablet Take 1 tablet (5 mg total) by mouth 2 (two) times daily. 04/24/17    Jodelle Gross, NP  cholecalciferol (VITAMIN D) 1000 units tablet Take 1,000 Units by mouth daily.    [provider]  metoprolol succinate (TOPROL-XL) 25 MG 24 hr tablet Take 0.5 tablets (12.5 mg total) by mouth daily. 11/27/17   Jonelle Sidle, MD  rosuvastatin (CRESTOR) 5 MG tablet Take 5 mg by mouth daily.    [provider]  traMADol (ULTRAM) 50 MG tablet Take 1 tablet (50 mg total) by mouth 3 (three) times daily as needed for moderate pain. 04/24/17   Jodelle Gross, NP    Family History Family History  Problem Relation Age of Onset  . Cancer Mother   . Diabetes Paternal Grandmother   . Heart attack Maternal Grandfather   . Heart disease Father   . Arthritis Father        crippling  . Alcoholism Brother   . Drug abuse Brother   . Congestive Heart Failure Daughter     Social History Social History   Tobacco Use  . Smoking status: Never Smoker  . Smokeless tobacco: Never Used  Substance Use Topics  . Alcohol use: No  . Drug use: No     Allergies   Feldene [piroxicam] and Codeine   Review of Systems Review of Systems  Constitutional: Negative for fever.  HENT: Negative for sore throat.   Eyes: Negative for visual disturbance.  Respiratory: Negative for shortness of breath.   Cardiovascular: Negative for chest pain, palpitations, syncope and near-syncope.  Gastrointestinal: Positive for abdominal pain and constipation. Negative for blood in stool, diarrhea, nausea and vomiting.  Genitourinary: Negative for dysuria.  Musculoskeletal: Negative for neck pain.  Skin: Negative for rash.  Neurological: Positive for headaches.  Psychiatric/Behavioral: Positive for hallucinations.     Physical Exam Updated Vital Signs BP (!) 152/77   Pulse 65   Temp 97.9 F (36.6 C) (Oral)   Resp 20   Ht 5' (1.524 m)   Wt 73.5 kg   SpO2 100%   BMI 31.64 kg/m   Physical Exam Vitals signs and nursing note reviewed.  Constitutional:       General: She is in acute distress (tearful).     Appearance: She is well-developed. She is not ill-appearing.  HENT:     Head: Normocephalic and atraumatic.  Eyes:     Extraocular Movements: Extraocular movements intact.     Conjunctiva/sclera: Conjunctivae normal.     Pupils: Pupils are equal, round, and reactive to light.  Neck:     Musculoskeletal: Normal range of motion and neck supple. No  neck rigidity.  Cardiovascular:     Rate and Rhythm: Normal rate and regular rhythm.     Pulses: Normal pulses.     Heart sounds: No murmur.  Pulmonary:     Effort: Pulmonary effort is normal. No respiratory distress.     Breath sounds: Normal breath sounds.  Abdominal:     Palpations: Abdomen is soft. There is no mass.     Tenderness: There is no abdominal tenderness. There is no guarding.  Musculoskeletal: Normal range of motion.        General: No tenderness or signs of injury.  Skin:    General: Skin is warm and dry.  Neurological:     General: No focal deficit present.     Mental Status: She is alert and oriented to person, place, and time.     Sensory: No sensory deficit.     Motor: No weakness.  Psychiatric:        Speech: Speech normal.      ED Treatments / Results  Labs (all labs ordered are listed, but only abnormal results are displayed) Labs Reviewed  BASIC METABOLIC PANEL - Abnormal; Notable for the following components:      Result Value   Creatinine, Ser 1.20 (*)    GFR calc non Af Amer 44 (*)    GFR calc Af Amer 51 (*)    All other components within normal limits  URINALYSIS, ROUTINE W REFLEX MICROSCOPIC - Abnormal; Notable for the following components:   Color, Urine STRAW (*)    Leukocytes, UA SMALL (*)    All other components within normal limits  CBC  TROPONIN I  HEPATIC FUNCTION PANEL  LIPASE, BLOOD    EKG EKG Interpretation  Date/Time:  Tuesday January 02 2018 12:35:49 EST Ventricular Rate:  66 PR Interval:    QRS Duration: 92 QT  Interval:  443 QTC Calculation: 465 R Axis:   43 Text Interpretation:  Sinus rhythm Abnormal R-wave progression, early transition Borderline ST depression, diffuse leads compared with prior 4/19 Confirmed by Meridee ScoreButler, Michael [id number]) on 01/02/2018 12:40:32 PM   Radiology Ct Head Wo Contrast  Result Date: 01/02/2018 CLINICAL DATA:  Onset hallucinations this morning. EXAM: CT HEAD WITHOUT CONTRAST TECHNIQUE: Contiguous axial images were obtained from the base of the skull through the vertex without intravenous contrast. COMPARISON:  Head CT scan 07/05/2014. FINDINGS: Brain: No evidence of acute infarction, hemorrhage, hydrocephalus, extra-axial collection or mass lesion/mass effect. There is some chronic microvascular ischemic change. Vascular: Atherosclerosis noted. Skull: Intact.  No focal lesion. Sinuses/Orbits: Image right maxillary sinus is completely opacified, unchanged. Other: None. IMPRESSION: No acute abnormality. Chronic microvascular ischemic change. Atherosclerosis. Chronic right maxillary sinus disease. Electronically Signed   By: Drusilla Kannerhomas  Dalessio M.D.   On: 01/02/2018 16:01   Ct Abdomen Pelvis W Contrast  Result Date: 01/02/2018 CLINICAL DATA:  Acute lower abdominal pain. EXAM: CT ABDOMEN AND PELVIS WITH CONTRAST TECHNIQUE: Multidetector CT imaging of the abdomen and pelvis was performed using the standard protocol following bolus administration of intravenous contrast. CONTRAST:  75mL OMNIPAQUE IOHEXOL 300 MG/ML  SOLN COMPARISON:  CT scan of May 09, 2017. FINDINGS: Lower chest: No acute abnormality. Hepatobiliary: No focal liver abnormality is seen. Status post cholecystectomy. No biliary dilatation. Pancreas: Unremarkable. No pancreatic ductal dilatation or surrounding inflammatory changes. Spleen: Normal in size without focal abnormality. Adrenals/Urinary Tract: Adrenal glands are unremarkable. Kidneys are normal, without renal calculi, focal lesion, or hydronephrosis. Bladder is  unremarkable. Stomach/Bowel: The stomach appears normal. Status  post appendectomy. There is no evidence of bowel obstruction. Focal diverticulitis of distal sigmoid colon is noted without abscess formation. Vascular/Lymphatic: Aortic atherosclerosis. No enlarged abdominal or pelvic lymph nodes. Reproductive: Status post hysterectomy. No adnexal masses. Other: No abdominal wall hernia or abnormality. No abdominopelvic ascites. Musculoskeletal: No acute or significant osseous findings. IMPRESSION: Focal sigmoid diverticulitis is noted without abscess formation. Aortic Atherosclerosis (ICD10-I70.0). Electronically Signed   By: Lupita Raider, M.D.   On: 01/02/2018 16:07    Procedures Procedures (including critical care time)  Medications Ordered in ED Medications  LORazepam (ATIVAN) injection 0.5 mg (0.5 mg Intravenous Given 01/02/18 1243)  sodium chloride 0.9 % bolus 1,000 mL (0 mLs Intravenous Stopped 01/02/18 1344)  metoCLOPramide (REGLAN) injection 10 mg (10 mg Intravenous Given 01/02/18 1404)  iohexol (OMNIPAQUE) 300 MG/ML solution 75 mL (75 mLs Intravenous Contrast Given 01/02/18 1540)  amoxicillin-clavulanate (AUGMENTIN) 875-125 MG per tablet 1 tablet (1 tablet Oral Given 01/02/18 1641)     Initial Impression / Assessment and Plan / ED Course  I have reviewed the triage vital signs and the nursing notes.  Pertinent labs & imaging results that were available during my care of the patient were reviewed by me and considered in my medical decision making (see chart for details).  Clinical Course as of Jan 02 1899  Tue Jan 02, 2018  1339 Reevaluated patient.  She says her headache is slightly improved but is mostly in her occipital area now.  Her daughter is rubbing the area.  Her lab work is been fairly unremarkable.  She is still pending her CT imaging of her head and abdomen.   [MB]  1615 Reevaluated patient, her headache is mostly resolved now.  She said she has not had any  hallucinations since she came to the department.  Her CT of her head was negative her lab work was unremarkable.  CT of her abdomen did show a little focal diverticulitis so I think that is worth covering her for that.  She would like to go home and I do not think that is unreasonable.  As far as the hallucinations possibly she is under tremendous amount of stress and she may be getting very poor sleep.  The infection might of been enough to temper.   [MB]    Clinical Course User Index [MB] Terrilee Files, MD     Final Clinical Impressions(s) / ED Diagnoses   Final diagnoses:  Acute non intractable tension-type headache  Visual hallucination  Acute diverticulitis    ED Discharge Orders         Ordered    amoxicillin-clavulanate (AUGMENTIN) 875-125 MG tablet  Every 12 hours,   Status:  Discontinued     01/02/18 1618    amoxicillin-clavulanate (AUGMENTIN) 875-125 MG tablet  Every 12 hours     01/02/18 1644           Terrilee Files, MD 01/02/18 1901

## 2018-01-06 ENCOUNTER — Emergency Department (HOSPITAL_COMMUNITY): Payer: PPO

## 2018-01-06 ENCOUNTER — Other Ambulatory Visit: Payer: Self-pay

## 2018-01-06 ENCOUNTER — Inpatient Hospital Stay (HOSPITAL_COMMUNITY)
Admission: EM | Admit: 2018-01-06 | Discharge: 2018-01-09 | DRG: 392 | Disposition: A | Discharge status: Home / Self Care | Payer: PPO | Attending: Internal Medicine | Admitting: Internal Medicine

## 2018-01-06 ENCOUNTER — Encounter (HOSPITAL_COMMUNITY): Payer: Self-pay | Admitting: Emergency Medicine

## 2018-01-06 DIAGNOSIS — I2510 Atherosclerotic heart disease of native coronary artery without angina pectoris: Secondary | ICD-10-CM | POA: Diagnosis present

## 2018-01-06 DIAGNOSIS — Z7901 Long term (current) use of anticoagulants: Secondary | ICD-10-CM

## 2018-01-06 DIAGNOSIS — R509 Fever, unspecified: Secondary | ICD-10-CM | POA: Diagnosis not present

## 2018-01-06 DIAGNOSIS — Z885 Allergy status to narcotic agent status: Secondary | ICD-10-CM | POA: Diagnosis not present

## 2018-01-06 DIAGNOSIS — N183 Chronic kidney disease, stage 3 (moderate): Secondary | ICD-10-CM | POA: Diagnosis not present

## 2018-01-06 DIAGNOSIS — N289 Disorder of kidney and ureter, unspecified: Secondary | ICD-10-CM

## 2018-01-06 DIAGNOSIS — K5730 Diverticulosis of large intestine without perforation or abscess without bleeding: Secondary | ICD-10-CM | POA: Diagnosis not present

## 2018-01-06 DIAGNOSIS — I480 Paroxysmal atrial fibrillation: Secondary | ICD-10-CM | POA: Diagnosis present

## 2018-01-06 DIAGNOSIS — Z90710 Acquired absence of both cervix and uterus: Secondary | ICD-10-CM | POA: Diagnosis not present

## 2018-01-06 DIAGNOSIS — I129 Hypertensive chronic kidney disease with stage 1 through stage 4 chronic kidney disease, or unspecified chronic kidney disease: Secondary | ICD-10-CM | POA: Diagnosis not present

## 2018-01-06 DIAGNOSIS — E785 Hyperlipidemia, unspecified: Secondary | ICD-10-CM | POA: Diagnosis not present

## 2018-01-06 DIAGNOSIS — I10 Essential (primary) hypertension: Secondary | ICD-10-CM | POA: Diagnosis present

## 2018-01-06 DIAGNOSIS — Z8249 Family history of ischemic heart disease and other diseases of the circulatory system: Secondary | ICD-10-CM | POA: Diagnosis not present

## 2018-01-06 DIAGNOSIS — K589 Irritable bowel syndrome without diarrhea: Secondary | ICD-10-CM | POA: Diagnosis not present

## 2018-01-06 DIAGNOSIS — K5732 Diverticulitis of large intestine without perforation or abscess without bleeding: Principal | ICD-10-CM | POA: Diagnosis present

## 2018-01-06 DIAGNOSIS — Z9049 Acquired absence of other specified parts of digestive tract: Secondary | ICD-10-CM

## 2018-01-06 DIAGNOSIS — Z8673 Personal history of transient ischemic attack (TIA), and cerebral infarction without residual deficits: Secondary | ICD-10-CM | POA: Diagnosis not present

## 2018-01-06 DIAGNOSIS — E860 Dehydration: Secondary | ICD-10-CM | POA: Diagnosis not present

## 2018-01-06 DIAGNOSIS — Z79899 Other long term (current) drug therapy: Secondary | ICD-10-CM

## 2018-01-06 DIAGNOSIS — K5792 Diverticulitis of intestine, part unspecified, without perforation or abscess without bleeding: Secondary | ICD-10-CM | POA: Diagnosis not present

## 2018-01-06 DIAGNOSIS — Z886 Allergy status to analgesic agent status: Secondary | ICD-10-CM | POA: Diagnosis not present

## 2018-01-06 DIAGNOSIS — K219 Gastro-esophageal reflux disease without esophagitis: Secondary | ICD-10-CM | POA: Diagnosis not present

## 2018-01-06 DIAGNOSIS — R1032 Left lower quadrant pain: Secondary | ICD-10-CM | POA: Diagnosis not present

## 2018-01-06 LAB — INFLUENZA PANEL BY PCR (TYPE A & B)
Influenza A By PCR: NEGATIVE
Influenza B By PCR: NEGATIVE

## 2018-01-06 LAB — CBC WITH DIFFERENTIAL/PLATELET
Abs Immature Granulocytes: 0.02 10*3/uL (ref 0.00–0.07)
Basophils Absolute: 0.1 10*3/uL (ref 0.0–0.1)
Basophils Relative: 1 %
EOS PCT: 1 %
Eosinophils Absolute: 0.1 10*3/uL (ref 0.0–0.5)
HCT: 42.1 % (ref 36.0–46.0)
Hemoglobin: 13.7 g/dL (ref 12.0–15.0)
Immature Granulocytes: 0 %
LYMPHS ABS: 1.3 10*3/uL (ref 0.7–4.0)
Lymphocytes Relative: 18 %
MCH: 29.4 pg (ref 26.0–34.0)
MCHC: 32.5 g/dL (ref 30.0–36.0)
MCV: 90.3 fL (ref 80.0–100.0)
Monocytes Absolute: 0.8 10*3/uL (ref 0.1–1.0)
Monocytes Relative: 11 %
Neutro Abs: 5.1 10*3/uL (ref 1.7–7.7)
Neutrophils Relative %: 69 %
Platelets: 268 10*3/uL (ref 150–400)
RBC: 4.66 MIL/uL (ref 3.87–5.11)
RDW: 12.9 % (ref 11.5–15.5)
WBC: 7.4 10*3/uL (ref 4.0–10.5)
nRBC: 0 % (ref 0.0–0.2)

## 2018-01-06 LAB — I-STAT CG4 LACTIC ACID, ED: Lactic Acid, Venous: 1.61 mmol/L (ref 0.5–1.9)

## 2018-01-06 LAB — URINALYSIS, ROUTINE W REFLEX MICROSCOPIC
BILIRUBIN URINE: NEGATIVE
Bacteria, UA: NONE SEEN
Glucose, UA: NEGATIVE mg/dL
Ketones, ur: 5 mg/dL — AB
Leukocytes, UA: NEGATIVE
Nitrite: NEGATIVE
Protein, ur: NEGATIVE mg/dL
Specific Gravity, Urine: 1.014 (ref 1.005–1.030)
pH: 7 (ref 5.0–8.0)

## 2018-01-06 LAB — COMPREHENSIVE METABOLIC PANEL
ALT: 11 U/L (ref 0–44)
AST: 14 U/L — ABNORMAL LOW (ref 15–41)
Albumin: 4 g/dL (ref 3.5–5.0)
Alkaline Phosphatase: 66 U/L (ref 38–126)
Anion gap: 11 (ref 5–15)
BUN: 14 mg/dL (ref 8–23)
CO2: 22 mmol/L (ref 22–32)
CREATININE: 1.28 mg/dL — AB (ref 0.44–1.00)
Calcium: 9 mg/dL (ref 8.9–10.3)
Chloride: 102 mmol/L (ref 98–111)
GFR calc Af Amer: 47 mL/min — ABNORMAL LOW (ref >60)
GFR calc non Af Amer: 41 mL/min — ABNORMAL LOW (ref >60)
Glucose, Bld: 96 mg/dL (ref 70–99)
Potassium: 3.9 mmol/L (ref 3.5–5.1)
Sodium: 135 mmol/L (ref 135–145)
TOTAL PROTEIN: 8 g/dL (ref 6.5–8.1)
Total Bilirubin: 0.8 mg/dL (ref 0.3–1.2)

## 2018-01-06 LAB — GROUP A STREP BY PCR: Group A Strep by PCR: NOT DETECTED

## 2018-01-06 LAB — LIPASE, BLOOD: Lipase: 31 U/L (ref 11–51)

## 2018-01-06 MED ORDER — ONDANSETRON HCL 4 MG/2ML IJ SOLN
4.0000 mg | Freq: Once | INTRAMUSCULAR | Status: AC
  Administered 2018-01-06: 4 mg via INTRAVENOUS
  Filled 2018-01-06: qty 2

## 2018-01-06 MED ORDER — FENTANYL CITRATE (PF) 100 MCG/2ML IJ SOLN
12.5000 ug | INTRAMUSCULAR | Status: DC | PRN
  Administered 2018-01-06 (×4): 12.5 ug via INTRAVENOUS
  Filled 2018-01-06 (×4): qty 2

## 2018-01-06 MED ORDER — ALPRAZOLAM 0.5 MG PO TABS
0.5000 mg | ORAL_TABLET | Freq: Every evening | ORAL | Status: DC | PRN
  Administered 2018-01-06 – 2018-01-08 (×3): 0.5 mg via ORAL
  Filled 2018-01-06 (×3): qty 1

## 2018-01-06 MED ORDER — CIPROFLOXACIN IN D5W 400 MG/200ML IV SOLN
400.0000 mg | Freq: Two times a day (BID) | INTRAVENOUS | Status: DC
  Administered 2018-01-07 – 2018-01-09 (×6): 400 mg via INTRAVENOUS
  Filled 2018-01-06 (×6): qty 200

## 2018-01-06 MED ORDER — ROSUVASTATIN CALCIUM 10 MG PO TABS
5.0000 mg | ORAL_TABLET | Freq: Every day | ORAL | Status: DC
  Administered 2018-01-07 – 2018-01-09 (×3): 5 mg via ORAL
  Filled 2018-01-06 (×6): qty 1

## 2018-01-06 MED ORDER — ACETAMINOPHEN 650 MG RE SUPP: 650.0000 mg | Freq: Four times a day (QID) | RECTAL | Status: DC | PRN

## 2018-01-06 MED ORDER — CIPROFLOXACIN IN D5W 400 MG/200ML IV SOLN
400.0000 mg | Freq: Once | INTRAVENOUS | Status: AC
  Administered 2018-01-06: 400 mg via INTRAVENOUS
  Filled 2018-01-06: qty 200

## 2018-01-06 MED ORDER — LACTATED RINGERS IV SOLN
INTRAVENOUS | Status: AC
  Administered 2018-01-06 – 2018-01-07 (×2): via INTRAVENOUS

## 2018-01-06 MED ORDER — ONDANSETRON HCL 4 MG PO TABS: 4.0000 mg | ORAL_TABLET | Freq: Four times a day (QID) | ORAL | Status: DC | PRN

## 2018-01-06 MED ORDER — ACETAMINOPHEN 500 MG PO TABS
1000.0000 mg | ORAL_TABLET | Freq: Once | ORAL | Status: AC
  Administered 2018-01-06: 1000 mg via ORAL
  Filled 2018-01-06: qty 2

## 2018-01-06 MED ORDER — VITAMIN D 25 MCG (1000 UNIT) PO TABS
1000.0000 [IU] | ORAL_TABLET | Freq: Every day | ORAL | Status: DC
  Administered 2018-01-07 – 2018-01-09 (×3): 1000 [IU] via ORAL
  Filled 2018-01-06 (×8): qty 1

## 2018-01-06 MED ORDER — METRONIDAZOLE IN NACL 5-0.79 MG/ML-% IV SOLN
500.0000 mg | Freq: Once | INTRAVENOUS | Status: AC
  Administered 2018-01-06: 500 mg via INTRAVENOUS
  Filled 2018-01-06: qty 100

## 2018-01-06 MED ORDER — FENTANYL CITRATE (PF) 100 MCG/2ML IJ SOLN
25.0000 ug | Freq: Once | INTRAMUSCULAR | Status: AC
  Administered 2018-01-06: 25 ug via INTRAVENOUS
  Filled 2018-01-06: qty 2

## 2018-01-06 MED ORDER — APIXABAN 5 MG PO TABS
5.0000 mg | ORAL_TABLET | Freq: Two times a day (BID) | ORAL | Status: DC
  Administered 2018-01-06 – 2018-01-09 (×6): 5 mg via ORAL
  Filled 2018-01-06 (×6): qty 1

## 2018-01-06 MED ORDER — ONDANSETRON HCL 4 MG/2ML IJ SOLN
4.0000 mg | Freq: Four times a day (QID) | INTRAMUSCULAR | Status: DC | PRN
  Administered 2018-01-07: 4 mg via INTRAVENOUS
  Filled 2018-01-06: qty 2

## 2018-01-06 MED ORDER — METOPROLOL SUCCINATE ER 25 MG PO TB24
12.5000 mg | ORAL_TABLET | Freq: Every day | ORAL | Status: DC
  Administered 2018-01-07 – 2018-01-09 (×3): 12.5 mg via ORAL
  Filled 2018-01-06 (×3): qty 1

## 2018-01-06 MED ORDER — ACETAMINOPHEN 325 MG PO TABS
650.0000 mg | ORAL_TABLET | Freq: Four times a day (QID) | ORAL | Status: DC | PRN
  Administered 2018-01-08 – 2018-01-09 (×3): 650 mg via ORAL
  Filled 2018-01-06 (×3): qty 2

## 2018-01-06 MED ORDER — FLUTICASONE PROPIONATE 50 MCG/ACT NA SUSP
1.0000 | Freq: Every day | NASAL | Status: DC
  Administered 2018-01-07 – 2018-01-09 (×3): 1 via NASAL
  Filled 2018-01-06 (×2): qty 16

## 2018-01-06 MED ORDER — SODIUM CHLORIDE 0.9 % IV BOLUS
500.0000 mL | Freq: Once | INTRAVENOUS | Status: AC
  Administered 2018-01-06: 500 mL via INTRAVENOUS

## 2018-01-06 MED ORDER — IOPAMIDOL (ISOVUE-300) INJECTION 61%
100.0000 mL | Freq: Once | INTRAVENOUS | Status: AC | PRN
  Administered 2018-01-06: 100 mL via INTRAVENOUS

## 2018-01-06 MED ORDER — METRONIDAZOLE IN NACL 5-0.79 MG/ML-% IV SOLN
500.0000 mg | Freq: Three times a day (TID) | INTRAVENOUS | Status: DC
  Administered 2018-01-07 – 2018-01-09 (×8): 500 mg via INTRAVENOUS
  Filled 2018-01-06 (×8): qty 100

## 2018-01-06 NOTE — ED Notes (Signed)
Patient requesting pain medication. PRN fentanyl to be given.

## 2018-01-06 NOTE — ED Notes (Signed)
Emptied suction canister of 800 ml urine.

## 2018-01-06 NOTE — ED Triage Notes (Signed)
Pt states she has been aching all over since being here the other day with low grade fever.  States she is taking the abx she was given (augmentin) and is not feeling any better.

## 2018-01-06 NOTE — ED Notes (Signed)
Went into patient's room due to give pain medication as ordered and requested by patient. O2 saturation 77% on room air. Patient was sleeping at this time. Placed patient on 2L oxygen via nasal canula. Patient woke up and O2 saturation increased to 100% on 2L. Patient denies shortness of breath. Gave patient liquid meal tray as ordered. Patient sitting up in bed at this time eating.

## 2018-01-06 NOTE — ED Notes (Signed)
Pt given meal tray.

## 2018-01-06 NOTE — H&P (Addendum)
History and Physical    Tamara Roberts DOB: July 04, 1940 DOA: 01/06/2018  PCP: Kirstie Peri, MD   Patient coming from: Home  Chief Complaint: N/V/abdominal pain  HPI: Tamara Roberts is a 78 y.o. female with medical history significant for CAD, prior CVA, hypertension, GERD, dyslipidemia, IBS, recurrent history of sigmoid diverticulitis and paroxysmal atrial fibrillation on chronic anticoagulation with Eliquis who presented to the ED with worsening left lower quadrant abdominal pain along with some nausea, vomiting, dry heaves over the last 2 days.  She has had difficulty tolerating oral intake and has had some subjective fevers and chills along with some diffuse myalgias.  She is also had some mild headaches.  She denies any significant diarrhea, blood in her stools, or chest pain, or shortness of breath.  She states that she was prescribed Augmentin 5 days ago and has been trying to take this medication for her diverticulitis as prescribed.  She denies any dysuria, cough, or congestion.  She denies any sick contacts.   ED Course: Vital signs are stable and she was noted to have a temperature of 103.4 Fahrenheit.  Laboratory data with white blood cell count of 7.4, lactic acid 1.6, and creatinine of 1.28 with baseline creatinine of 1-1.3 that is consistent with CKD stage III.  Two-view chest x-ray with no acute findings noted and CT abdomen/pelvis with contrast demonstrates mild diverticulitis with no findings of abscess.  Urine analysis does not suggest any UTI.  Review of Systems: All others reviewed and otherwise negative.  Past Medical History:  Diagnosis Date  . Bursitis   . Diverticulitis   . DJD (degenerative joint disease), lumbar   . Essential hypertension   . GERD (gastroesophageal reflux disease)   . History of cardiac catheterization    March 2018 - minimal atherosclerosis  . Hyperlipidemia   . Irritable bowel syndrome   . Paroxysmal atrial fibrillation [facility])      Diagnosed April 2019  . Spondylolisthesis at L4-L5 level   . Stroke [facility])     Past Surgical History:  Procedure Laterality Date  . ABDOMINAL HYSTERECTOMY    . APPENDECTOMY    . BILATERAL SALPINGOOPHORECTOMY    . BREAST CYST ASPIRATION Left   . CHOLECYSTECTOMY    . HERNIA REPAIR    . LEFT HEART CATH AND CORONARY ANGIOGRAPHY N/A 04/01/2016   Procedure: Left Heart Cath and Coronary Angiography;  Surgeon: Peter M Swaziland, MD;  Location: [facility] INVASIVE CV LAB;  Service: Cardiovascular;  Laterality: N/A;  . LUMBAR SPINE SURGERY    . SHOULDER SURGERY Right      reports that she has never smoked. She has never used smokeless tobacco. She reports that she does not drink alcohol or use drugs.  Allergies  Allergen Reactions  . Feldene [Piroxicam] Shortness Of Breath  . Codeine Nausea Only    Family History  Problem Relation Age of Onset  . Cancer Mother   . Diabetes Paternal Grandmother   . Heart attack Maternal Grandfather   . Heart disease Father   . Arthritis Father        crippling  . Alcoholism Brother   . Drug abuse Brother   . Congestive Heart Failure Daughter     Prior to Admission medications   Medication Sig Start Date End Date Taking? Authorizing Provider  ALPRAZolam Prudy Feeler) 0.5 MG tablet Take 0.5 mg by mouth at bedtime.    [provider]  amoxicillin-clavulanate (AUGMENTIN) 875-125 MG tablet Take 1 tablet by mouth every  12 (twelve) hours. 01/02/18   Terrilee FilesButler, Michael C, MD  apixaban (ELIQUIS) 5 MG TABS tablet Take 1 tablet (5 mg total) by mouth 2 (two) times daily. 04/24/17   Jodelle GrossLawrence, Kathryn M, NP  cholecalciferol (VITAMIN D) 1000 units tablet Take 1,000 Units by mouth daily.    [provider]  metoprolol succinate (TOPROL-XL) 25 MG 24 hr tablet Take 0.5 tablets (12.5 mg total) by mouth daily. 11/27/17   Jonelle SidleMcDowell, Samuel G, MD  rosuvastatin (CRESTOR) 5 MG tablet Take 5 mg by mouth daily.    [provider]    Physical Exam: Vitals:    01/06/18 1230 01/06/18 1300 01/06/18 1301 01/06/18 1330  BP: 127/69 128/73  118/86  Pulse: 88 86  69  Resp: 13 15  15   Temp:   99 F (37.2 C)   TempSrc:   Oral   SpO2: 95% 98%  97%  Weight:      Height:        Constitutional: NAD, calm, comfortable Vitals:   01/06/18 1230 01/06/18 1300 01/06/18 1301 01/06/18 1330  BP: 127/69 128/73  118/86  Pulse: 88 86  69  Resp: 13 15  15   Temp:   99 F (37.2 C)   TempSrc:   Oral   SpO2: 95% 98%  97%  Weight:      Height:       Eyes: lids and conjunctivae normal ENMT: Mucous membranes are moist.  Neck: normal, supple Respiratory: clear to auscultation bilaterally. Normal respiratory effort. No accessory muscle use.  Cardiovascular: Regular rate and rhythm, no murmurs. No extremity edema. Abdomen: Significant tenderness to palpation of the left lower quadrant with no guarding, rigidity, or rebound, no distention. Bowel sounds positive.  Musculoskeletal:  No joint deformity upper and lower extremities.   Skin: no rashes, lesions, ulcers.  Psychiatric: Normal judgment and insight. Alert and oriented x 3. Normal mood.   Labs on Admission: I have personally reviewed following labs and imaging studies  CBC: Recent Labs  Lab 01/02/18 1218 01/06/18 1128  WBC 5.9 7.4  NEUTROABS  --  5.1  HGB 13.1 13.7  HCT 41.6 42.1  MCV 91.4 90.3  PLT 258 268   Basic Metabolic Panel: Recent Labs  Lab 01/02/18 1218 01/06/18 1128  NA 139 135  K 4.2 3.9  CL 108 102  CO2 23 22  GLUCOSE 90 96  BUN 17 14  CREATININE 1.20* 1.28*  CALCIUM 9.1 9.0   GFR: Estimated Creatinine Clearance: 33.9 mL/min (A) (by C-G formula based on SCr of 1.28 mg/dL (H)). Liver Function Tests: Recent Labs  Lab 01/02/18 1218 01/06/18 1128  AST 15 14*  ALT 11 11  ALKPHOS 59 66  BILITOT 0.5 0.8  PROT 7.5 8.0  ALBUMIN 3.8 4.0   Recent Labs  Lab 01/02/18 1218 01/06/18 1128  LIPASE 35 31   No results for input(s): AMMONIA in the last 168 hours. Coagulation  Profile: No results for input(s): INR, PROTIME in the last 168 hours. Cardiac Enzymes: Recent Labs  Lab 01/02/18 1218  TROPONINI <0.03   BNP (last 3 results) No results for input(s): PROBNP in the last 8760 hours. HbA1C: No results for input(s): HGBA1C in the last 72 hours. CBG: No results for input(s): GLUCAP in the last 168 hours. Lipid Profile: No results for input(s): CHOL, HDL, LDLCALC, TRIG, CHOLHDL, LDLDIRECT in the last 72 hours. Thyroid Function Tests: No results for input(s): TSH, T4TOTAL, FREET4, T3FREE, THYROIDAB in the last 72 hours. Anemia Panel: No  results for input(s): VITAMINB12, FOLATE, FERRITIN, TIBC, IRON, RETICCTPCT in the last 72 hours. Urine analysis:    Component Value Date/Time   COLORURINE YELLOW 01/06/2018 1119   APPEARANCEUR CLEAR 01/06/2018 1119   LABSPEC 1.014 01/06/2018 1119   PHURINE 7.0 01/06/2018 1119   GLUCOSEU NEGATIVE 01/06/2018 1119   HGBUR SMALL (A) 01/06/2018 1119   BILIRUBINUR NEGATIVE 01/06/2018 1119   KETONESUR 5 (A) 01/06/2018 1119   PROTEINUR NEGATIVE 01/06/2018 1119   NITRITE NEGATIVE 01/06/2018 1119   LEUKOCYTESUR NEGATIVE 01/06/2018 1119    Radiological Exams on Admission: Dg Chest 2 View  Result Date: 01/06/2018 CLINICAL DATA:  Fever. EXAM: CHEST - 2 VIEW COMPARISON:  05/02/2017 FINDINGS: Heart size appears normal. No pleural effusion or edema identified. Mild aortic atherosclerosis. No acute osseous abnormalities. Treated compression deformity noted within the lumbar spine. IMPRESSION: 1. No acute cardiopulmonary abnormalities. Electronically Signed   By: Signa Kell M.D.   On: 01/06/2018 12:58   Ct Abdomen Pelvis W Contrast  Result Date: 01/06/2018 CLINICAL DATA:  Generalized body aches with low-grade fever. EXAM: CT ABDOMEN AND PELVIS WITH CONTRAST TECHNIQUE: Multidetector CT imaging of the abdomen and pelvis was performed using the standard protocol following bolus administration of intravenous contrast. CONTRAST:   ISOVUE-300 IOPAMIDOL (ISOVUE-300) INJECTION 61% COMPARISON:  01/02/2018 and 05/09/2017 FINDINGS: Lower chest: Lung bases are normal. Minimal calcified plaque over the left main coronary artery. Hepatobiliary: Previous cholecystectomy. Liver and biliary tree are normal. Pancreas: Normal. Spleen: Normal. Adrenals/Urinary Tract: Adrenal glands are normal. Kidneys are normal in size without hydronephrosis or nephrolithiasis. Mild prominence of the right renal pelvis unchanged few small bilateral renal cortical hypodensities unchanged likely cysts. Ureters and bladder are normal. Stomach/Bowel: Stomach and small bowel are normal. Previous appendectomy. Diverticulosis over the descending and rectosigmoid colon. There is very subtle stranding of the pericolonic fat adjacent diverticula over the sigmoid colon in the midline pelvis as this could be due to mild acute diverticulitis. Certainly no evidence of perforation or abscess. Vascular/Lymphatic: Mild calcified plaque over the abdominal aorta. No adenopathy. Reproductive: Prior hysterectomy. Other: No significant free fluid. Musculoskeletal: Posterior fusion hardware at the L4-5 level unchanged. Mild L1 compression fracture post kyphoplasty unchanged. IMPRESSION: Moderate diverticulosis of the colon. There is very subtle stranding of the pericolonic fat adjacent diverticula of the sigmoid colon in the midline pelvis which could be seen with a mild acute diverticulitis. No perforation or abscess. Small bilateral renal cortical hypodensities too small to characterize but likely cysts. Aortic Atherosclerosis (ICD10-I70.0). Electronically Signed   By: Elberta Fortis M.D.   On: 01/06/2018 12:53    Assessment/Plan Principal Problem:   Diverticulitis Active Problems:   Dyslipidemia   Essential hypertension   History of stroke   Renal insufficiency   AF (paroxysmal atrial fibrillation) (HCC)    1. Acute diverticulitis.  Patient has failed outpatient treatment with  Augmentin and cannot tolerate sufficient oral intake requiring IV antibiotics.  Agree with use of IV ciprofloxacin and Flagyl which will be continued.  Maintain on IV fluid for hydration.  Consider consultation to GI pending clinical course. 2. Intractable nausea and vomiting.  Patient appears to have some mild dehydration we will continue IV fluid as well as Zofran as needed.  Maintain on clear liquid diet and advance as tolerated. 3. Hypertension-stable.  Maintain on home metoprolol. 4. Dyslipidemia.  Maintain on statin. 5. Paroxysmal atrial fibrillation.  Maintain on home metoprolol for rate control as well as Eliquis for anticoagulation.  No need for telemetry monitoring  at this time. 6. CKD stage III. Currently at baseline; will continue to monitor am labs.   DVT prophylaxis: On Eliquis Code Status: Full Family Communication:Daughter and son-in-law at bedside  Disposition Plan:Admit for IV antibiotics and fluids Consults called:None Admission status: Inpatient, Med-Surg   Pratik Hoover BrunetteD Shah DO [facility] Pager [phone]  If 7PM-7AM, please contact night-coverage [email or web address] Password [email or web address]  01/06/2018, 1:51 PM

## 2018-01-06 NOTE — ED Provider Notes (Signed)
[facility] EMERGENCY DEPARTMENT Provider Note   CSN: [id number] Arrival date & time: 01/06/18  1052     History   Chief Complaint Chief Complaint  Patient presents with  . Fever  . Generalized Body Aches    HPI Tamara Roberts is a 78 y.o. female.  HPI Patient was seen 5 days ago and diagnosed with diverticulitis.  Started on Augmentin.  States since that time she is had increasing abdominal pain, nausea and vomiting.  She has had difficulty tolerating oral intake.  Has had subjective fevers and chills and diffuse myalgias.  Patient complains of generalized headache but no neck pain or stiffness.  Had soft bowel movement today.  Denies urinary symptoms. Past Medical History:  Diagnosis Date  . Bursitis   . Diverticulitis   . DJD (degenerative joint disease), lumbar   . Essential hypertension   . GERD (gastroesophageal reflux disease)   . History of cardiac catheterization    March 2018 - minimal atherosclerosis  . Hyperlipidemia   . Irritable bowel syndrome   . Paroxysmal atrial fibrillation [facility])    Diagnosed April 2019  . Spondylolisthesis at L4-L5 level   . Stroke [facility])     Patient Active Problem List   Diagnosis Date Noted  . Atrial fibrillation with rapid ventricular response (HCC) 04/14/2017  . GERD (gastroesophageal reflux disease) 04/14/2017  . S/P lumbar spinal fusion 08/25/2016  . Chest pain 04/02/2016  . Hypokalemia 04/02/2016  . Renal insufficiency 04/02/2016  . History of stroke 04/01/2016  . Dyslipidemia 09/26/2008  . Essential hypertension 09/26/2008  . DJD (degenerative joint disease), lumbar 09/26/2008    Past Surgical History:  Procedure Laterality Date  . ABDOMINAL HYSTERECTOMY    . APPENDECTOMY    . BILATERAL SALPINGOOPHORECTOMY    . BREAST CYST ASPIRATION Left   . CHOLECYSTECTOMY    . HERNIA REPAIR    . LEFT HEART CATH AND CORONARY ANGIOGRAPHY N/A 04/01/2016   Procedure: Left Heart Cath and Coronary Angiography;  Surgeon: Peter M  Swaziland, MD;  Location: [facility] INVASIVE CV LAB;  Service: Cardiovascular;  Laterality: N/A;  . LUMBAR SPINE SURGERY    . SHOULDER SURGERY Right      OB History    Gravida  4   Para      Term      Preterm      AB      Living  4     SAB      TAB      Ectopic      Multiple      Live Births               Home Medications    Prior to Admission medications   Medication Sig Start Date End Date Taking? Authorizing Provider  ALPRAZolam Prudy Feeler) 0.5 MG tablet Take 0.5 mg by mouth at bedtime.    [provider]  amoxicillin-clavulanate (AUGMENTIN) 875-125 MG tablet Take 1 tablet by mouth every 12 (twelve) hours. 01/02/18   Terrilee Files, MD  apixaban (ELIQUIS) 5 MG TABS tablet Take 1 tablet (5 mg total) by mouth 2 (two) times daily. 04/24/17   Jodelle Gross, NP  cholecalciferol (VITAMIN D) 1000 units tablet Take 1,000 Units by mouth daily.    [provider]  metoprolol succinate (TOPROL-XL) 25 MG 24 hr tablet Take 0.5 tablets (12.5 mg total) by mouth daily. 11/27/17   Jonelle Sidle, MD  rosuvastatin (CRESTOR) 5 MG tablet Take 5 mg by mouth  daily.    [provider]  traMADol (ULTRAM) 50 MG tablet Take 1 tablet (50 mg total) by mouth 3 (three) times daily as needed for moderate pain. 04/24/17   Jodelle Gross, NP    Family History Family History  Problem Relation Age of Onset  . Cancer Mother   . Diabetes Paternal Grandmother   . Heart attack Maternal Grandfather   . Heart disease Father   . Arthritis Father        crippling  . Alcoholism Brother   . Drug abuse Brother   . Congestive Heart Failure Daughter     Social History Social History   Tobacco Use  . Smoking status: Never Smoker  . Smokeless tobacco: Never Used  Substance Use Topics  . Alcohol use: No  . Drug use: No     Allergies   Feldene [piroxicam] and Codeine   Review of Systems Review of Systems  Constitutional: Positive for chills, fatigue and  fever.  HENT: Negative for rhinorrhea, sore throat and trouble swallowing.   Eyes: Negative for visual disturbance.  Respiratory: Negative for cough and shortness of breath.   Cardiovascular: Negative for chest pain.  Gastrointestinal: Positive for abdominal pain, nausea and vomiting. Negative for blood in stool.  Genitourinary: Negative for dysuria, flank pain and frequency.  Musculoskeletal: Positive for myalgias. Negative for back pain, neck pain and neck stiffness.  Skin: Negative for rash and wound.  Neurological: Positive for headaches. Negative for dizziness, weakness, light-headedness and numbness.  All other systems reviewed and are negative.    Physical Exam Updated Vital Signs BP 127/69   Pulse 88   Temp 99 F (37.2 C) (Oral)   Resp 13   Ht 5' (1.524 m)   Wt 73 kg   SpO2 95%   BMI 31.43 kg/m   Physical Exam Vitals signs and nursing note reviewed.  Constitutional:      Appearance: Normal appearance. She is well-developed.  HENT:     Head: Normocephalic and atraumatic.     Comments: No obvious head trauma.  No temporal artery tenderness to palpation.    Nose: Nose normal. No congestion.     Mouth/Throat:     Pharynx: Posterior oropharyngeal erythema present.     Comments: Mildly erythematous oropharynx. Eyes:     Extraocular Movements: Extraocular movements intact.     Conjunctiva/sclera: Conjunctivae normal.     Pupils: Pupils are equal, round, and reactive to light.     Comments: No meningismus  Neck:     Musculoskeletal: Normal range of motion and neck supple. No neck rigidity or muscular tenderness.  Cardiovascular:     Rate and Rhythm: Normal rate and regular rhythm.     Heart sounds: No murmur. No friction rub. No gallop.   Pulmonary:     Effort: Pulmonary effort is normal. No respiratory distress.     Breath sounds: Normal breath sounds. No stridor. No wheezing, rhonchi or rales.  Chest:     Chest wall: No tenderness.  Abdominal:     General:  Bowel sounds are normal.     Palpations: Abdomen is soft.     Tenderness: There is abdominal tenderness. There is no guarding or rebound.     Comments: She is abdominal tenderness to palpation most pronounced in the left lower quadrant.  Guarding present.  No definite rebound tenderness.  Musculoskeletal: Normal range of motion.        General: No swelling, tenderness, deformity or signs of injury.  Right lower leg: No edema.     Left lower leg: No edema.     Comments: No definite midline thoracic or lumbar tenderness.  No CVA tenderness.  No lower extremity swelling, asymmetry or tenderness.  Lymphadenopathy:     Cervical: Cervical adenopathy present.  Skin:    General: Skin is warm and dry.     Findings: No erythema or rash.  Neurological:     Mental Status: She is alert and oriented to person, place, and time.  Psychiatric:        Behavior: Behavior normal.      ED Treatments / Results  Labs (all labs ordered are listed, but only abnormal results are displayed) Labs Reviewed  COMPREHENSIVE METABOLIC PANEL - Abnormal; Notable for the following components:      Result Value   Creatinine, Ser 1.28 (*)    AST 14 (*)    GFR calc non Af Amer 41 (*)    GFR calc Af Amer 47 (*)    All other components within normal limits  URINALYSIS, ROUTINE W REFLEX MICROSCOPIC - Abnormal; Notable for the following components:   Hgb urine dipstick SMALL (*)    Ketones, ur 5 (*)    All other components within normal limits  GROUP A STREP BY PCR  CULTURE, BLOOD (ROUTINE X 2)  CULTURE, BLOOD (ROUTINE X 2)  CBC WITH DIFFERENTIAL/PLATELET  LIPASE, BLOOD  INFLUENZA PANEL BY PCR (TYPE A & B)  I-STAT CG4 LACTIC ACID, ED    EKG None  Radiology Dg Chest 2 View  Result Date: 01/06/2018 CLINICAL DATA:  Fever. EXAM: CHEST - 2 VIEW COMPARISON:  05/02/2017 FINDINGS: Heart size appears normal. No pleural effusion or edema identified. Mild aortic atherosclerosis. No acute osseous abnormalities.  Treated compression deformity noted within the lumbar spine. IMPRESSION: 1. No acute cardiopulmonary abnormalities. Electronically Signed   By: Signa Kellaylor  Stroud M.D.   On: 01/06/2018 12:58   Ct Abdomen Pelvis W Contrast  Result Date: 01/06/2018 CLINICAL DATA:  Generalized body aches with low-grade fever. EXAM: CT ABDOMEN AND PELVIS WITH CONTRAST TECHNIQUE: Multidetector CT imaging of the abdomen and pelvis was performed using the standard protocol following bolus administration of intravenous contrast. CONTRAST:  100mL ISOVUE-300 IOPAMIDOL (ISOVUE-300) INJECTION 61% COMPARISON:  01/02/2018 and 05/09/2017 FINDINGS: Lower chest: Lung bases are normal. Minimal calcified plaque over the left main coronary artery. Hepatobiliary: Previous cholecystectomy. Liver and biliary tree are normal. Pancreas: Normal. Spleen: Normal. Adrenals/Urinary Tract: Adrenal glands are normal. Kidneys are normal in size without hydronephrosis or nephrolithiasis. Mild prominence of the right renal pelvis unchanged few small bilateral renal cortical hypodensities unchanged likely cysts. Ureters and bladder are normal. Stomach/Bowel: Stomach and small bowel are normal. Previous appendectomy. Diverticulosis over the descending and rectosigmoid colon. There is very subtle stranding of the pericolonic fat adjacent diverticula over the sigmoid colon in the midline pelvis as this could be due to mild acute diverticulitis. Certainly no evidence of perforation or abscess. Vascular/Lymphatic: Mild calcified plaque over the abdominal aorta. No adenopathy. Reproductive: Prior hysterectomy. Other: No significant free fluid. Musculoskeletal: Posterior fusion hardware at the L4-5 level unchanged. Mild L1 compression fracture post kyphoplasty unchanged. IMPRESSION: Moderate diverticulosis of the colon. There is very subtle stranding of the pericolonic fat adjacent diverticula of the sigmoid colon in the midline pelvis which could be seen with a mild acute  diverticulitis. No perforation or abscess. Small bilateral renal cortical hypodensities too small to characterize but likely cysts. Aortic Atherosclerosis (ICD10-I70.0). Electronically Signed  By: Elberta Fortisaniel  Boyle M.D.   On: 01/06/2018 12:53    Procedures Procedures (including critical care time)  Medications Ordered in ED Medications  ciprofloxacin (CIPRO) IVPB 400 mg (has no administration in time range)  metroNIDAZOLE (FLAGYL) IVPB 500 mg (has no administration in time range)  sodium chloride 0.9 % bolus 500 mL (500 mLs Intravenous New Bag/Given 01/06/18 1143)  ondansetron (ZOFRAN) injection 4 mg (4 mg Intravenous Given 01/06/18 1144)  fentaNYL (SUBLIMAZE) injection 25 mcg (25 mcg Intravenous Given 01/06/18 1145)  iopamidol (ISOVUE-300) 61 % injection 100 mL (100 mLs Intravenous Contrast Given 01/06/18 1228)  acetaminophen (TYLENOL) tablet 1,000 mg (1,000 mg Oral Given 01/06/18 1155)     Initial Impression / Assessment and Plan / ED Course  I have reviewed the triage vital signs and the nursing notes.  Pertinent labs & imaging results that were available during my care of the patient were reviewed by me and considered in my medical decision making (see chart for details).     CT with persistent diverticulitis.  Normal white blood cell count and lactic acid.  Given failed outpatient treatment will start on IV antibiotics and admit to hospitalist.  Final Clinical Impressions(s) / ED Diagnoses   Final diagnoses:  Diverticulitis    ED Discharge Orders    None       Loren RacerYelverton, Liliann File, MD 01/06/18 1336

## 2018-01-07 LAB — COMPREHENSIVE METABOLIC PANEL
ALT: 9 U/L (ref 0–44)
AST: 10 U/L — AB (ref 15–41)
Albumin: 3.4 g/dL — ABNORMAL LOW (ref 3.5–5.0)
Alkaline Phosphatase: 52 U/L (ref 38–126)
Anion gap: 9 (ref 5–15)
BUN: 12 mg/dL (ref 8–23)
CO2: 23 mmol/L (ref 22–32)
Calcium: 8.4 mg/dL — ABNORMAL LOW (ref 8.9–10.3)
Chloride: 101 mmol/L (ref 98–111)
Creatinine, Ser: 1.36 mg/dL — ABNORMAL HIGH (ref 0.44–1.00)
GFR calc Af Amer: 44 mL/min — ABNORMAL LOW (ref >60)
GFR calc non Af Amer: 38 mL/min — ABNORMAL LOW (ref >60)
Glucose, Bld: 104 mg/dL — ABNORMAL HIGH (ref 70–99)
Potassium: 3.7 mmol/L (ref 3.5–5.1)
Sodium: 133 mmol/L — ABNORMAL LOW (ref 135–145)
Total Bilirubin: 0.6 mg/dL (ref 0.3–1.2)
Total Protein: 6.9 g/dL (ref 6.5–8.1)

## 2018-01-07 LAB — CBC
HCT: 38 % (ref 36.0–46.0)
Hemoglobin: 11.9 g/dL — ABNORMAL LOW (ref 12.0–15.0)
MCH: 28.5 pg (ref 26.0–34.0)
MCHC: 31.3 g/dL (ref 30.0–36.0)
MCV: 90.9 fL (ref 80.0–100.0)
Platelets: 221 10*3/uL (ref 150–400)
RBC: 4.18 MIL/uL (ref 3.87–5.11)
RDW: 13.2 % (ref 11.5–15.5)
WBC: 7.1 10*3/uL (ref 4.0–10.5)
nRBC: 0 % (ref 0.0–0.2)

## 2018-01-07 LAB — LACTIC ACID, PLASMA: Lactic Acid, Venous: 0.9 mmol/L (ref 0.5–1.9)

## 2018-01-07 MED ORDER — FENTANYL CITRATE (PF) 100 MCG/2ML IJ SOLN
25.0000 ug | Freq: Once | INTRAMUSCULAR | Status: AC
  Administered 2018-01-07: 25 ug via INTRAVENOUS

## 2018-01-07 MED ORDER — FENTANYL CITRATE (PF) 100 MCG/2ML IJ SOLN
25.0000 ug | INTRAMUSCULAR | Status: DC | PRN
  Administered 2018-01-07 (×2): 25 ug via INTRAVENOUS
  Filled 2018-01-07 (×3): qty 2

## 2018-01-07 MED ORDER — FENTANYL CITRATE (PF) 100 MCG/2ML IJ SOLN
50.0000 ug | INTRAMUSCULAR | Status: DC | PRN
  Administered 2018-01-07 (×4): 50 ug via INTRAVENOUS
  Filled 2018-01-07 (×4): qty 2

## 2018-01-07 MED ORDER — FENTANYL CITRATE (PF) 100 MCG/2ML IJ SOLN
INTRAMUSCULAR | Status: AC
  Administered 2018-01-07: 02:00:00
  Filled 2018-01-07: qty 2

## 2018-01-07 NOTE — Progress Notes (Signed)
PROGRESS NOTE    Tamara Roberts  JXB:[id number] DOB: [birth year] DOA: 01/06/2018 PCP: Kirstie Peri, MD   Brief Narrative:  Per HPI:  Tamara Roberts is a 78 y.o. female with medical history significant for CAD, prior CVA, hypertension, GERD, dyslipidemia, IBS, recurrent history of sigmoid diverticulitis and paroxysmal atrial fibrillation on chronic anticoagulation with Eliquis who presented to the ED with worsening left lower quadrant abdominal pain along with some nausea, vomiting, dry heaves over the last 2 days.  She has had difficulty tolerating oral intake and has had some subjective fevers and chills along with some diffuse myalgias.  She is also had some mild headaches.  She denies any significant diarrhea, blood in her stools, or chest pain, or shortness of breath.  She states that she was prescribed Augmentin 5 days ago and has been trying to take this medication for her diverticulitis as prescribed.  She denies any dysuria, cough, or congestion.  She denies any sick contacts.  Patient was admitted with acute diverticulitis and started on IV ciprofloxacin and Flagyl.  Assessment & Plan:   Principal Problem:   Diverticulitis Active Problems:   Dyslipidemia   Essential hypertension   History of stroke   Renal insufficiency   AF (paroxysmal atrial fibrillation) (HCC)  1. Acute diverticulitis.  Patient has failed outpatient treatment with Augmentin and cannot tolerate sufficient oral intake requiring IV antibiotics.  Agree with use of IV ciprofloxacin and Flagyl which will be continued.  Maintain on IV fluid for hydration.  Consider consultation to GI pending clinical course. 2. Intractable nausea and vomiting-improving.  Patient appears to have some mild dehydration we will continue IV fluid as well as Zofran as needed.  Advance to full liquid today. 3. Hypertension-stable.  Maintain on home metoprolol. 4. Dyslipidemia.  Maintain on statin. 5. Paroxysmal atrial fibrillation.   Maintain on home metoprolol for rate control as well as Eliquis for anticoagulation.  No need for telemetry monitoring at this time. 6. CKD stage III. Currently at baseline; will continue to monitor am labs.   DVT prophylaxis: Eliquis Code Status: Full Family Communication: Daughter at bedside Disposition Plan: Continue IV antibiotics and IV fluids and advance diet as tolerated   Consultants:   None  Procedures:   None  Antimicrobials:   IV ciprofloxacin and Flagyl 1/4->   Subjective: Patient seen and evaluated today with no new acute complaints or concerns.  She states that her nausea and vomiting are better along with her abdominal pain.  She denies any bowel movement while here.  She did have some significant abdominal pain overnight which required higher doses of fentanyl.  She is wanting to have some diet advancement today.  Objective: Vitals:   01/06/18 1730 01/06/18 1822 01/06/18 2328 01/07/18 0512  BP: (!) 144/67 127/70 (!) 141/92 123/65  Pulse: 69 70 79 94  Resp: 14 19 18 18   Temp:  98.6 F (37 C) 98.8 F (37.1 C) (!) 100.4 F (38 C)  TempSrc:  Oral Oral Oral  SpO2: 99% 99% 100% 98%  Weight:  71.9 kg    Height:  5' (1.524 m)      Intake/Output Summary (Last 24 hours) at 01/07/2018 1020 Last data filed at 01/07/2018 0900 Gross per 24 hour  Intake 1688.06 ml  Output 650 ml  Net 1038.06 ml   Filed Weights   01/06/18 1059 01/06/18 1822  Weight: 73 kg 71.9 kg    Examination:  General exam: Appears calm and comfortable  Respiratory system: Clear  to auscultation. Respiratory effort normal. Cardiovascular system: S1 & S2 heard, RRR. No JVD, murmurs, rubs, gallops or clicks. No pedal edema. Gastrointestinal system: Abdomen is nondistended, soft and nontender. No organomegaly or masses felt. Normal bowel sounds heard. Central nervous system: Alert and oriented. No focal neurological deficits. Extremities: Symmetric 5 x 5 power. Skin: No rashes, lesions or  ulcers Psychiatry: Judgement and insight appear normal. Mood & affect appropriate.     Data Reviewed: I have personally reviewed following labs and imaging studies  CBC: Recent Labs  Lab 01/02/18 1218 01/06/18 1128 01/07/18 0652  WBC 5.9 7.4 7.1  NEUTROABS  --  5.1  --   HGB 13.1 13.7 11.9*  HCT 41.6 42.1 38.0  MCV 91.4 90.3 90.9  PLT 258 268 221   Basic Metabolic Panel: Recent Labs  Lab 01/02/18 1218 01/06/18 1128 01/07/18 0652  NA 139 135 133*  K 4.2 3.9 3.7  CL 108 102 101  CO2 23 22 23   GLUCOSE 90 96 104*  BUN 17 14 12   CREATININE 1.20* 1.28* 1.36*  CALCIUM 9.1 9.0 8.4*   GFR: Estimated Creatinine Clearance: 31.7 mL/min (A) (by C-G formula based on SCr of 1.36 mg/dL (H)). Liver Function Tests: Recent Labs  Lab 01/02/18 1218 01/06/18 1128 01/07/18 0652  AST 15 14* 10*  ALT 11 11 9   ALKPHOS 59 66 52  BILITOT 0.5 0.8 0.6  PROT 7.5 8.0 6.9  ALBUMIN 3.8 4.0 3.4*   Recent Labs  Lab 01/02/18 1218 01/06/18 1128  LIPASE 35 31   No results for input(s): AMMONIA in the last 168 hours. Coagulation Profile: No results for input(s): INR, PROTIME in the last 168 hours. Cardiac Enzymes: Recent Labs  Lab 01/02/18 1218  TROPONINI <0.03   BNP (last 3 results) No results for input(s): PROBNP in the last 8760 hours. HbA1C: No results for input(s): HGBA1C in the last 72 hours. CBG: No results for input(s): GLUCAP in the last 168 hours. Lipid Profile: No results for input(s): CHOL, HDL, LDLCALC, TRIG, CHOLHDL, LDLDIRECT in the last 72 hours. Thyroid Function Tests: No results for input(s): TSH, T4TOTAL, FREET4, T3FREE, THYROIDAB in the last 72 hours. Anemia Panel: No results for input(s): VITAMINB12, FOLATE, FERRITIN, TIBC, IRON, RETICCTPCT in the last 72 hours. Sepsis Labs: Recent Labs  Lab 01/06/18 1131 01/07/18 0652  LATICACIDVEN 1.61 0.9    Recent Results (from the past 240 hour(s))  Group A Strep by PCR     Status: None   Collection Time:  01/06/18 11:17 AM  Result Value Ref Range Status   Group A Strep by PCR NOT DETECTED NOT DETECTED Final    Comment: Performed at [facility], [address] [place], [place] [address]  Culture, blood (Routine X 2) w Reflex to ID Panel     Status: None (Preliminary result)   Collection Time: 01/06/18 11:43 AM  Result Value Ref Range Status   Specimen Description LEFT ANTECUBITAL  Final   Special Requests   Final    BOTTLES DRAWN AEROBIC AND ANAEROBIC Blood Culture adequate volume Performed at [facility], [address] [place], [place] [address]    Culture PENDING  Incomplete   Report Status PENDING  Incomplete  Culture, blood (Routine X 2) w Reflex to ID Panel     Status: None (Preliminary result)   Collection Time: 01/06/18 11:44 AM  Result Value Ref Range Status   Specimen Description RIGHT ANTECUBITAL  Final   Special Requests   Final  BOTTLES DRAWN AEROBIC AND ANAEROBIC Blood Culture adequate volume Performed at [facility], [address] [place], [place] [address]    Culture PENDING  Incomplete   Report Status PENDING  Incomplete         Radiology Studies: Dg Chest 2 View  Result Date: 01/06/2018 CLINICAL DATA:  Fever. EXAM: CHEST - 2 VIEW COMPARISON:  05/02/2017 FINDINGS: Heart size appears normal. No pleural effusion or edema identified. Mild aortic atherosclerosis. No acute osseous abnormalities. Treated compression deformity noted within the lumbar spine. IMPRESSION: 1. No acute cardiopulmonary abnormalities. Electronically Signed   By: Signa Kellaylor  Stroud M.D.   On: 01/06/2018 12:58   Ct Abdomen Pelvis W Contrast  Result Date: 01/06/2018 CLINICAL DATA:  Generalized body aches with low-grade fever. EXAM: CT ABDOMEN AND PELVIS WITH CONTRAST TECHNIQUE: Multidetector CT imaging of the abdomen and pelvis was performed using the standard protocol following bolus administration of intravenous contrast. CONTRAST:  100mL ISOVUE-300 IOPAMIDOL (ISOVUE-300) INJECTION 61%  COMPARISON:  01/02/2018 and 05/09/2017 FINDINGS: Lower chest: Lung bases are normal. Minimal calcified plaque over the left main coronary artery. Hepatobiliary: Previous cholecystectomy. Liver and biliary tree are normal. Pancreas: Normal. Spleen: Normal. Adrenals/Urinary Tract: Adrenal glands are normal. Kidneys are normal in size without hydronephrosis or nephrolithiasis. Mild prominence of the right renal pelvis unchanged few small bilateral renal cortical hypodensities unchanged likely cysts. Ureters and bladder are normal. Stomach/Bowel: Stomach and small bowel are normal. Previous appendectomy. Diverticulosis over the descending and rectosigmoid colon. There is very subtle stranding of the pericolonic fat adjacent diverticula over the sigmoid colon in the midline pelvis as this could be due to mild acute diverticulitis. Certainly no evidence of perforation or abscess. Vascular/Lymphatic: Mild calcified plaque over the abdominal aorta. No adenopathy. Reproductive: Prior hysterectomy. Other: No significant free fluid. Musculoskeletal: Posterior fusion hardware at the L4-5 level unchanged. Mild L1 compression fracture post kyphoplasty unchanged. IMPRESSION: Moderate diverticulosis of the colon. There is very subtle stranding of the pericolonic fat adjacent diverticula of the sigmoid colon in the midline pelvis which could be seen with a mild acute diverticulitis. No perforation or abscess. Small bilateral renal cortical hypodensities too small to characterize but likely cysts. Aortic Atherosclerosis (ICD10-I70.0). Electronically Signed   By: Elberta Fortisaniel  Boyle M.D.   On: 01/06/2018 12:53        Scheduled Meds: . apixaban  5 mg Oral BID  . cholecalciferol  1,000 Units Oral Daily  . fluticasone  1 spray Each Nare Daily  . metoprolol succinate  12.5 mg Oral Daily  . rosuvastatin  5 mg Oral Daily   Continuous Infusions: . ciprofloxacin 400 mg (01/07/18 0139)  . lactated ringers 75 mL/hr at 01/06/18 1929    . metronidazole 500 mg (01/07/18 0949)     LOS: 1 day    Time spent: 30 minutes     Hoover Brunette , DO [facility] Pager [phone]  If 7PM-7AM, please contact night-coverage [email or web address] Password [email or web address] 01/07/2018, 10:20 AM

## 2018-01-08 LAB — CBC
HCT: 35.6 % — ABNORMAL LOW (ref 36.0–46.0)
Hemoglobin: 11.5 g/dL — ABNORMAL LOW (ref 12.0–15.0)
MCH: 29.1 pg (ref 26.0–34.0)
MCHC: 32.3 g/dL (ref 30.0–36.0)
MCV: 90.1 fL (ref 80.0–100.0)
NRBC: 0 % (ref 0.0–0.2)
PLATELETS: 197 10*3/uL (ref 150–400)
RBC: 3.95 MIL/uL (ref 3.87–5.11)
RDW: 12.8 % (ref 11.5–15.5)
WBC: 6.4 10*3/uL (ref 4.0–10.5)

## 2018-01-08 LAB — BASIC METABOLIC PANEL
Anion gap: 7 (ref 5–15)
BUN: 10 mg/dL (ref 8–23)
CO2: 23 mmol/L (ref 22–32)
Calcium: 8.6 mg/dL — ABNORMAL LOW (ref 8.9–10.3)
Chloride: 104 mmol/L (ref 98–111)
Creatinine, Ser: 1.2 mg/dL — ABNORMAL HIGH (ref 0.44–1.00)
GFR calc Af Amer: 51 mL/min — ABNORMAL LOW (ref >60)
GFR calc non Af Amer: 44 mL/min — ABNORMAL LOW (ref >60)
Glucose, Bld: 93 mg/dL (ref 70–99)
POTASSIUM: 3.8 mmol/L (ref 3.5–5.1)
Sodium: 134 mmol/L — ABNORMAL LOW (ref 135–145)

## 2018-01-08 MED ORDER — MECLIZINE HCL 12.5 MG PO TABS
25.0000 mg | ORAL_TABLET | Freq: Three times a day (TID) | ORAL | Status: DC | PRN
  Administered 2018-01-08: 25 mg via ORAL
  Filled 2018-01-08: qty 2

## 2018-01-08 MED ORDER — FENTANYL CITRATE (PF) 100 MCG/2ML IJ SOLN
25.0000 ug | INTRAMUSCULAR | Status: DC | PRN
  Administered 2018-01-08 (×2): 25 ug via INTRAVENOUS
  Filled 2018-01-08 (×3): qty 2

## 2018-01-08 NOTE — Progress Notes (Signed)
PROGRESS NOTE    Tamara MannanCarolyn V Roberts  ZOX:[id number] DOB: 03/17/1940 DOA: 01/06/2018 PCP: Kirstie PeriShah, Ashish, MD   Brief Narrative:  Per HPI: Tamara Dykesarolyn V Stanleyis a 78 y.o.femalewith medical history significant forCAD, prior CVA, hypertension, GERD, dyslipidemia, IBS, recurrent history of sigmoid diverticulitis and paroxysmal atrial fibrillation on chronic anticoagulation with Eliquis who presented to the ED with worsening left lower quadrant abdominal pain along with some nausea, vomiting, dry heaves over the last 2 days. She has had difficulty tolerating oral intake and has had some subjective fevers and chills along with some diffuse myalgias. She is also had some mild headaches. She denies any significant diarrhea, blood in her stools, or chest pain, or shortness of breath. She states that she was prescribed Augmentin 5 days ago and has been trying to take this medication for her diverticulitis as prescribed. She denies any dysuria, cough, or congestion. She denies any sick contacts.  Patient was admitted with acute diverticulitis and started on IV ciprofloxacin and Flagyl.  Assessment & Plan:   Principal Problem:   Diverticulitis Active Problems:   Dyslipidemia   Essential hypertension   History of stroke   Renal insufficiency   AF (paroxysmal atrial fibrillation) (HCC)  1. Acute diverticulitis-improving. Patient has failed outpatient treatment with Augmentin and cannot tolerate sufficient oral intake requiring IV antibiotics. Agree with use of IV ciprofloxacin and Flagyl which will be continued. Maintain on IV fluid for hydration.  2. Intractable nausea and vomiting-improving. Patient appears to have some mild dehydration we will continue IV fluid as well as Zofran as needed.  Advance to soft diet today.  Transition to orals by a.m. and consider discharge if tolerating. 3. Hypertension-stable. Maintain on home metoprolol. 4. Dyslipidemia. Maintain on statin. 5. Paroxysmal  atrial fibrillation. Maintain on home metoprolol for rate control as well as Eliquis for anticoagulation. No need for telemetry monitoring at this time. 6. CKD stage III. Currently at baseline; will continue to monitor am labs.   DVT prophylaxis: Eliquis Code Status: Full Family Communication: Daughter at bedside Disposition Plan: Continue IV antibiotics and IV fluids and advance diet as tolerated   Consultants:   None  Procedures:   None  Antimicrobials:   IV ciprofloxacin and Flagyl 1/4->  Subjective: Patient seen and evaluated today with no new acute complaints or concerns. No acute concerns or events noted overnight.  She states that her abdominal pain is much improved this morning and she no longer has any nausea or vomiting.  She would like to have her diet advanced.  Objective: Vitals:   01/06/18 2328 01/07/18 0512 01/07/18 1400 01/07/18 2041  BP: (!) 141/92 123/65 118/61 128/70  Pulse: 79 94 75 79  Resp: 18 18 (!) 21 16  Temp: 98.8 F (37.1 C) (!) 100.4 F (38 C) (!) 100.6 F (38.1 C) 99.1 F (37.3 C)  TempSrc: Oral Oral Oral Oral  SpO2: 100% 98% 97% 96%  Weight:      Height:        Intake/Output Summary (Last 24 hours) at 01/08/2018 1212 Last data filed at 01/08/2018 0900 Gross per 24 hour  Intake 720 ml  Output -  Net 720 ml   Filed Weights   01/06/18 1059 01/06/18 1822  Weight: 73 kg 71.9 kg    Examination:  General exam: Appears calm and comfortable  Respiratory system: Clear to auscultation. Respiratory effort normal. Cardiovascular system: S1 & S2 heard, RRR. No JVD, murmurs, rubs, gallops or clicks. No pedal edema. Gastrointestinal system: Abdomen is nondistended,  soft and nontender. No organomegaly or masses felt. Normal bowel sounds heard. Central nervous system: Alert and oriented. No focal neurological deficits. Extremities: Symmetric 5 x 5 power. Skin: No rashes, lesions or ulcers Psychiatry: Judgement and insight appear normal.  Mood & affect appropriate.     Data Reviewed: I have personally reviewed following labs and imaging studies  CBC: Recent Labs  Lab 01/02/18 1218 01/06/18 1128 01/07/18 0652 01/08/18 0549  WBC 5.9 7.4 7.1 6.4  NEUTROABS  --  5.1  --   --   HGB 13.1 13.7 11.9* 11.5*  HCT 41.6 42.1 38.0 35.6*  MCV 91.4 90.3 90.9 90.1  PLT 258 268 221 197   Basic Metabolic Panel: Recent Labs  Lab 01/02/18 1218 01/06/18 1128 01/07/18 0652 01/08/18 0549  NA 139 135 133* 134*  K 4.2 3.9 3.7 3.8  CL 108 102 101 104  CO2 23 22 23 23   GLUCOSE 90 96 104* 93  BUN 17 14 12 10   CREATININE 1.20* 1.28* 1.36* 1.20*  CALCIUM 9.1 9.0 8.4* 8.6*   GFR: Estimated Creatinine Clearance: 35.9 mL/min (A) (by C-G formula based on SCr of 1.2 mg/dL (H)). Liver Function Tests: Recent Labs  Lab 01/02/18 1218 01/06/18 1128 01/07/18 0652  AST 15 14* 10*  ALT 11 11 9   ALKPHOS 59 66 52  BILITOT 0.5 0.8 0.6  PROT 7.5 8.0 6.9  ALBUMIN 3.8 4.0 3.4*   Recent Labs  Lab 01/02/18 1218 01/06/18 1128  LIPASE 35 31   No results for input(s): AMMONIA in the last 168 hours. Coagulation Profile: No results for input(s): INR, PROTIME in the last 168 hours. Cardiac Enzymes: Recent Labs  Lab 01/02/18 1218  TROPONINI <0.03   BNP (last 3 results) No results for input(s): PROBNP in the last 8760 hours. HbA1C: No results for input(s): HGBA1C in the last 72 hours. CBG: No results for input(s): GLUCAP in the last 168 hours. Lipid Profile: No results for input(s): CHOL, HDL, LDLCALC, TRIG, CHOLHDL, LDLDIRECT in the last 72 hours. Thyroid Function Tests: No results for input(s): TSH, T4TOTAL, FREET4, T3FREE, THYROIDAB in the last 72 hours. Anemia Panel: No results for input(s): VITAMINB12, FOLATE, FERRITIN, TIBC, IRON, RETICCTPCT in the last 72 hours. Sepsis Labs: Recent Labs  Lab 01/06/18 1131 01/07/18 0652  LATICACIDVEN 1.61 0.9    Recent Results (from the past 240 hour(s))  Group A Strep by PCR      Status: None   Collection Time: 01/06/18 11:17 AM  Result Value Ref Range Status   Group A Strep by PCR NOT DETECTED NOT DETECTED Final    Comment: Performed at [facility], [address], [place], [place] [address]  Culture, blood (Routine X 2) w Reflex to ID Panel     Status: None (Preliminary result)   Collection Time: 01/06/18 11:43 AM  Result Value Ref Range Status   Specimen Description LEFT ANTECUBITAL  Final   Special Requests   Final    BOTTLES DRAWN AEROBIC AND ANAEROBIC Blood Culture adequate volume   Culture   Final    NO GROWTH 2 DAYS Performed at [facility], [address] [place], [place] [address]    Report Status PENDING  Incomplete  Culture, blood (Routine X 2) w Reflex to ID Panel     Status: None (Preliminary result)   Collection Time: 01/06/18 11:44 AM  Result Value Ref Range Status   Specimen Description RIGHT ANTECUBITAL  Final   Special Requests   Final  BOTTLES DRAWN AEROBIC AND ANAEROBIC Blood Culture adequate volume   Culture   Final    NO GROWTH 2 DAYS Performed at [facility], [address] [place], [place] [address]    Report Status PENDING  Incomplete         Radiology Studies: Dg Chest 2 View  Result Date: 01/06/2018 CLINICAL DATA:  Fever. EXAM: CHEST - 2 VIEW COMPARISON:  05/02/2017 FINDINGS: Heart size appears normal. No pleural effusion or edema identified. Mild aortic atherosclerosis. No acute osseous abnormalities. Treated compression deformity noted within the lumbar spine. IMPRESSION: 1. No acute cardiopulmonary abnormalities. Electronically Signed   By: Signa Kellaylor  Stroud M.D.   On: 01/06/2018 12:58   Ct Abdomen Pelvis W Contrast  Result Date: 01/06/2018 CLINICAL DATA:  Generalized body aches with low-grade fever. EXAM: CT ABDOMEN AND PELVIS WITH CONTRAST TECHNIQUE: Multidetector CT imaging of the abdomen and pelvis was performed using the standard protocol following bolus administration of intravenous contrast. CONTRAST:  100mL  ISOVUE-300 IOPAMIDOL (ISOVUE-300) INJECTION 61% COMPARISON:  01/02/2018 and 05/09/2017 FINDINGS: Lower chest: Lung bases are normal. Minimal calcified plaque over the left main coronary artery. Hepatobiliary: Previous cholecystectomy. Liver and biliary tree are normal. Pancreas: Normal. Spleen: Normal. Adrenals/Urinary Tract: Adrenal glands are normal. Kidneys are normal in size without hydronephrosis or nephrolithiasis. Mild prominence of the right renal pelvis unchanged few small bilateral renal cortical hypodensities unchanged likely cysts. Ureters and bladder are normal. Stomach/Bowel: Stomach and small bowel are normal. Previous appendectomy. Diverticulosis over the descending and rectosigmoid colon. There is very subtle stranding of the pericolonic fat adjacent diverticula over the sigmoid colon in the midline pelvis as this could be due to mild acute diverticulitis. Certainly no evidence of perforation or abscess. Vascular/Lymphatic: Mild calcified plaque over the abdominal aorta. No adenopathy. Reproductive: Prior hysterectomy. Other: No significant free fluid. Musculoskeletal: Posterior fusion hardware at the L4-5 level unchanged. Mild L1 compression fracture post kyphoplasty unchanged. IMPRESSION: Moderate diverticulosis of the colon. There is very subtle stranding of the pericolonic fat adjacent diverticula of the sigmoid colon in the midline pelvis which could be seen with a mild acute diverticulitis. No perforation or abscess. Small bilateral renal cortical hypodensities too small to characterize but likely cysts. Aortic Atherosclerosis (ICD10-I70.0). Electronically Signed   By: Elberta Fortisaniel  Boyle M.D.   On: 01/06/2018 12:53        Scheduled Meds: . apixaban  5 mg Oral BID  . cholecalciferol  1,000 Units Oral Daily  . fluticasone  1 spray Each Nare Daily  . metoprolol succinate  12.5 mg Oral Daily  . rosuvastatin  5 mg Oral Daily   Continuous Infusions: . ciprofloxacin 400 mg (01/08/18 1201)    . lactated ringers 75 mL/hr at 01/07/18 1147  . metronidazole 500 mg (01/08/18 0849)     LOS: 2 days    Time spent: 30 minutes     Hoover Brunette , DO [facility] Pager [phone]  If 7PM-7AM, please contact night-coverage [email or web address] Password [email or web address] 01/08/2018, 12:12 PM

## 2018-01-09 LAB — CBC
HCT: 36.1 % (ref 36.0–46.0)
Hemoglobin: 11.6 g/dL — ABNORMAL LOW (ref 12.0–15.0)
MCH: 28.6 pg (ref 26.0–34.0)
MCHC: 32.1 g/dL (ref 30.0–36.0)
MCV: 88.9 fL (ref 80.0–100.0)
Platelets: 191 10*3/uL (ref 150–400)
RBC: 4.06 MIL/uL (ref 3.87–5.11)
RDW: 12.9 % (ref 11.5–15.5)
WBC: 6.6 10*3/uL (ref 4.0–10.5)
nRBC: 0 % (ref 0.0–0.2)

## 2018-01-09 LAB — BASIC METABOLIC PANEL WITH GFR
Anion gap: 11 (ref 5–15)
BUN: 12 mg/dL (ref 8–23)
CO2: 23 mmol/L (ref 22–32)
Calcium: 8.5 mg/dL — ABNORMAL LOW (ref 8.9–10.3)
Chloride: 100 mmol/L (ref 98–111)
Creatinine, Ser: 1.25 mg/dL — ABNORMAL HIGH (ref 0.44–1.00)
GFR calc Af Amer: 49 mL/min — ABNORMAL LOW
GFR calc non Af Amer: 42 mL/min — ABNORMAL LOW
Glucose, Bld: 98 mg/dL (ref 70–99)
Potassium: 4.1 mmol/L (ref 3.5–5.1)
Sodium: 134 mmol/L — ABNORMAL LOW (ref 135–145)

## 2018-01-09 MED ORDER — METRONIDAZOLE 500 MG PO TABS: 500.0000 mg | ORAL_TABLET | Freq: Three times a day (TID) | ORAL | 0 refills | Status: AC

## 2018-01-09 MED ORDER — CIPROFLOXACIN HCL 500 MG PO TABS: 500.0000 mg | ORAL_TABLET | Freq: Two times a day (BID) | ORAL | 0 refills | Status: AC

## 2018-01-09 NOTE — Care Management Important Message (Signed)
Important Message  Patient Details  Name: Tamara Roberts MRN: [id number] Date of Birth: November 07, 1940   Medicare Important Message Given:  Yes    Malcolm Metro, RN 01/09/2018, 12:43 PM

## 2018-01-09 NOTE — Progress Notes (Signed)
Removed IV-clean, dry, intact. Reviewed d/c paperwork with patient and daughter. Reviewed medication changes and new medications. Answered all questions. Wheeled stable patient and belongings to main entrance where she was picked up by her daughter. D/cd home

## 2018-01-09 NOTE — Discharge Summary (Signed)
Physician Discharge Summary  Tamara Roberts FSF:[id number] DOB: [birth year] DOA: 01/06/2018  PCP: Kirstie Peri, MD  Admit date: 01/06/2018  Discharge date: 01/09/2018  Admitted From:Home  Disposition:  Home  Recommendations for Outpatient Follow-up:  1. Follow up with PCP in 1-2 weeks 2. Continue on ciprofloxacin and Flagyl as prescribed for 7 more days to complete course of treatment  Home Health: None  Equipment/Devices: None  Discharge Condition: Stable  CODE STATUS: Full  Diet recommendation: Heart Healthy  Brief/Interim Summary: Per HPI: Tamara Roberts a 78 y.o.femalewith medical history significant forCAD, prior CVA, hypertension, GERD, dyslipidemia, IBS, recurrent history of sigmoid diverticulitis and paroxysmal atrial fibrillation on chronic anticoagulation with Eliquis who presented to the ED with worsening left lower quadrant abdominal pain along with some nausea, vomiting, dry heaves over the last 2 days. She has had difficulty tolerating oral intake and has had some subjective fevers and chills along with some diffuse myalgias. She is also had some mild headaches. She denies any significant diarrhea, blood in her stools, or chest pain, or shortness of breath. She states that she was prescribed Augmentin 5 days ago and has been trying to take this medication for her diverticulitis as prescribed. She denies any dysuria, cough, or congestion. She denies any sick contacts.  Patient was admitted with acute diverticulitis and started on IV ciprofloxacin and Flagyl.  She gradually improved over the course of several days with no further abdominal pain and is able to tolerate a diet and is having normal bowel movements.  She feels as though she is doing quite well and is agreeable to discharge today with continuation on oral antibiotics at home with close follow-up to her PCP in the next 1 to 2 weeks.  She will remain on ciprofloxacin and Flagyl as prescribed for 7 more  days to complete the course of treatment.  Discharge Diagnoses:  Principal Problem:   Diverticulitis Active Problems:   Dyslipidemia   Essential hypertension   History of stroke   Renal insufficiency   AF (paroxysmal atrial fibrillation) (HCC)  Principal discharge diagnosis: Acute diverticulitis.  Discharge Instructions  Discharge Instructions    Diet - low sodium heart healthy   Complete by:  As directed    Increase activity slowly   Complete by:  As directed      Allergies as of 01/09/2018      Reactions   Feldene [piroxicam] Shortness Of Breath   Codeine Nausea Only      Medication List    STOP taking these medications   amoxicillin-clavulanate 875-125 MG tablet Commonly known as:  AUGMENTIN     TAKE these medications   ALPRAZolam 0.5 MG tablet Commonly known as:  XANAX Take 0.5 mg by mouth at bedtime as needed.   apixaban 5 MG Tabs tablet Commonly known as:  ELIQUIS Take 1 tablet (5 mg total) by mouth 2 (two) times daily.   cholecalciferol 1000 units tablet Commonly known as:  VITAMIN D Take 1,000 Units by mouth daily.   ciprofloxacin 500 MG tablet Commonly known as:  CIPRO Take 1 tablet (500 mg total) by mouth 2 (two) times daily for 7 days.   fluticasone 50 MCG/ACT nasal spray Commonly known as:  FLONASE Place 1 spray into both nostrils daily.   metoprolol succinate 25 MG 24 hr tablet Commonly known as:  TOPROL-XL Take 0.5 tablets (12.5 mg total) by mouth daily.   metroNIDAZOLE 500 MG tablet Commonly known as:  FLAGYL Take 1 tablet (500 mg total)  by mouth 3 (three) times daily for 7 days.   rosuvastatin 5 MG tablet Commonly known as:  CRESTOR Take 5 mg by mouth daily.      Follow-up Information    Kirstie Peri, MD Follow up in 2 week(s).   Specialty:  Internal Medicine Contact information: [address]  [place] [address] [phone]        Jonelle Sidle, MD .   Specialty:  Cardiology Contact information: [address]  [place] [address] [phone]          Allergies  Allergen Reactions  . Feldene [Piroxicam] Shortness Of Breath  . Codeine Nausea Only    Consultations:  None   Procedures/Studies: Dg Chest 2 View  Result Date: 01/06/2018 CLINICAL DATA:  Fever. EXAM: CHEST - 2 VIEW COMPARISON:  05/02/2017 FINDINGS: Heart size appears normal. No pleural effusion or edema identified. Mild aortic atherosclerosis. No acute osseous abnormalities. Treated compression deformity noted within the lumbar spine. IMPRESSION: 1. No acute cardiopulmonary abnormalities. Electronically Signed   By: Signa Kell M.D.   On: 01/06/2018 12:58   Ct Head Wo Contrast  Result Date: 01/02/2018 CLINICAL DATA:  Onset hallucinations this morning. EXAM: CT HEAD WITHOUT CONTRAST TECHNIQUE: Contiguous axial images were obtained from the base of the skull through the vertex without intravenous contrast. COMPARISON:  Head CT scan 07/05/2014. FINDINGS: Brain: No evidence of acute infarction, hemorrhage, hydrocephalus, extra-axial collection or mass lesion/mass effect. There is some chronic microvascular ischemic change. Vascular: Atherosclerosis noted. Skull: Intact.  No focal lesion. Sinuses/Orbits: Image right maxillary sinus is completely opacified, unchanged. Other: None. IMPRESSION: No acute abnormality. Chronic microvascular ischemic change. Atherosclerosis. Chronic right maxillary sinus disease. Electronically Signed   By: Drusilla Kanner M.D.   On: 01/02/2018 16:01   Ct Abdomen Pelvis W Contrast  Result Date: 01/06/2018 CLINICAL DATA:  Generalized body aches with low-grade fever. EXAM: CT ABDOMEN AND PELVIS WITH CONTRAST TECHNIQUE: Multidetector CT imaging of the abdomen and pelvis was performed using the standard protocol following bolus administration of intravenous contrast. CONTRAST:  ISOVUE-300 IOPAMIDOL (ISOVUE-300) INJECTION 61% COMPARISON:  01/02/2018 and 05/09/2017 FINDINGS: Lower chest: Lung bases are  normal. Minimal calcified plaque over the left main coronary artery. Hepatobiliary: Previous cholecystectomy. Liver and biliary tree are normal. Pancreas: Normal. Spleen: Normal. Adrenals/Urinary Tract: Adrenal glands are normal. Kidneys are normal in size without hydronephrosis or nephrolithiasis. Mild prominence of the right renal pelvis unchanged few small bilateral renal cortical hypodensities unchanged likely cysts. Ureters and bladder are normal. Stomach/Bowel: Stomach and small bowel are normal. Previous appendectomy. Diverticulosis over the descending and rectosigmoid colon. There is very subtle stranding of the pericolonic fat adjacent diverticula over the sigmoid colon in the midline pelvis as this could be due to mild acute diverticulitis. Certainly no evidence of perforation or abscess. Vascular/Lymphatic: Mild calcified plaque over the abdominal aorta. No adenopathy. Reproductive: Prior hysterectomy. Other: No significant free fluid. Musculoskeletal: Posterior fusion hardware at the L4-5 level unchanged. Mild L1 compression fracture post kyphoplasty unchanged. IMPRESSION: Moderate diverticulosis of the colon. There is very subtle stranding of the pericolonic fat adjacent diverticula of the sigmoid colon in the midline pelvis which could be seen with a mild acute diverticulitis. No perforation or abscess. Small bilateral renal cortical hypodensities too small to characterize but likely cysts. Aortic Atherosclerosis (ICD10-I70.0). Electronically Signed   By: Elberta Fortis M.D.   On: 01/06/2018 12:53   Ct Abdomen Pelvis W Contrast  Result Date: 01/02/2018 CLINICAL DATA:  Acute lower  abdominal pain. EXAM: CT ABDOMEN AND PELVIS WITH CONTRAST TECHNIQUE: Multidetector CT imaging of the abdomen and pelvis was performed using the standard protocol following bolus administration of intravenous contrast. CONTRAST:  75mL OMNIPAQUE IOHEXOL 300 MG/ML  SOLN COMPARISON:  CT scan of May 09, 2017. FINDINGS: Lower  chest: No acute abnormality. Hepatobiliary: No focal liver abnormality is seen. Status post cholecystectomy. No biliary dilatation. Pancreas: Unremarkable. No pancreatic ductal dilatation or surrounding inflammatory changes. Spleen: Normal in size without focal abnormality. Adrenals/Urinary Tract: Adrenal glands are unremarkable. Kidneys are normal, without renal calculi, focal lesion, or hydronephrosis. Bladder is unremarkable. Stomach/Bowel: The stomach appears normal. Status post appendectomy. There is no evidence of bowel obstruction. Focal diverticulitis of distal sigmoid colon is noted without abscess formation. Vascular/Lymphatic: Aortic atherosclerosis. No enlarged abdominal or pelvic lymph nodes. Reproductive: Status post hysterectomy. No adnexal masses. Other: No abdominal wall hernia or abnormality. No abdominopelvic ascites. Musculoskeletal: No acute or significant osseous findings. IMPRESSION: Focal sigmoid diverticulitis is noted without abscess formation. Aortic Atherosclerosis (ICD10-I70.0). Electronically Signed   By: Lupita RaiderJames  Green Jr, M.D.   On: 01/02/2018 16:07     Discharge Exam: Vitals:   01/09/18 0631 01/09/18 0836  BP: 126/70   Pulse: 77   Resp: 15   Temp: 99 F (37.2 C)   SpO2: 96% 94%   Vitals:   01/08/18 2001 01/08/18 2144 01/09/18 0631 01/09/18 0836  BP:  (!) 154/76 126/70   Pulse:  92 77   Resp:  18 15   Temp:  (!) 100.8 F (38.2 C) 99 F (37.2 C)   TempSrc:  Oral Oral   SpO2: 92% 96% 96% 94%  Weight:      Height:        General: Pt is alert, awake, not in acute distress Cardiovascular: RRR, S1/S2 +, no rubs, no gallops Respiratory: CTA bilaterally, no wheezing, no rhonchi Abdominal: Soft, NT, ND, bowel sounds + Extremities: no edema, no cyanosis    The results of significant diagnostics from this hospitalization (including imaging, microbiology, ancillary and laboratory) are listed below for reference.     Microbiology: Recent Results (from the past  240 hour(s))  Group A Strep by PCR     Status: None   Collection Time: 01/06/18 11:17 AM  Result Value Ref Range Status   Group A Strep by PCR NOT DETECTED NOT DETECTED Final    Comment: Performed at [facility], [address] [place], [place] [address]  Culture, blood (Routine X 2) w Reflex to ID Panel     Status: None (Preliminary result)   Collection Time: 01/06/18 11:43 AM  Result Value Ref Range Status   Specimen Description LEFT ANTECUBITAL  Final   Special Requests   Final    BOTTLES DRAWN AEROBIC AND ANAEROBIC Blood Culture adequate volume   Culture   Final    NO GROWTH 3 DAYS Performed at [facility], [address] [place], [place] [address]    Report Status PENDING  Incomplete  Culture, blood (Routine X 2) w Reflex to ID Panel     Status: None (Preliminary result)   Collection Time: 01/06/18 11:44 AM  Result Value Ref Range Status   Specimen Description RIGHT ANTECUBITAL  Final   Special Requests   Final    BOTTLES DRAWN AEROBIC AND ANAEROBIC Blood Culture adequate volume   Culture   Final    NO GROWTH 3 DAYS Performed at [facility], [address] [place], [place] [address]    Report Status  PENDING  Incomplete     Labs: BNP (last 3 results) Recent Labs    04/14/17 2139  BNP 225.0*   Basic Metabolic Panel: Recent Labs  Lab 01/06/18 1128 01/07/18 0652 01/08/18 0549 01/09/18 0611  NA 135 133* 134* 134*  K 3.9 3.7 3.8 4.1  CL 102 101 104 100  CO2 22 23 23 23   GLUCOSE 96 104* 93 98  BUN 14 12 10 12   CREATININE 1.28* 1.36* 1.20* 1.25*  CALCIUM 9.0 8.4* 8.6* 8.5*   Liver Function Tests: Recent Labs  Lab 01/06/18 1128 01/07/18 0652  AST 14* 10*  ALT 11 9  ALKPHOS 66 52  BILITOT 0.8 0.6  PROT 8.0 6.9  ALBUMIN 4.0 3.4*   Recent Labs  Lab 01/06/18 1128  LIPASE 31   No results for input(s): AMMONIA in the last 168 hours. CBC: Recent Labs  Lab 01/06/18 1128 01/07/18 0652 01/08/18 0549 01/09/18 0611  WBC 7.4 7.1 6.4 6.6  NEUTROABS 5.1   --   --   --   HGB 13.7 11.9* 11.5* 11.6*  HCT 42.1 38.0 35.6* 36.1  MCV 90.3 90.9 90.1 88.9  PLT 268 221 197 191   Cardiac Enzymes: No results for input(s): CKTOTAL, CKMB, CKMBINDEX, TROPONINI in the last 168 hours. BNP: Invalid input(s): POCBNP CBG: No results for input(s): GLUCAP in the last 168 hours. D-Dimer No results for input(s): DDIMER in the last 72 hours. Hgb A1c No results for input(s): HGBA1C in the last 72 hours. Lipid Profile No results for input(s): CHOL, HDL, LDLCALC, TRIG, CHOLHDL, LDLDIRECT in the last 72 hours. Thyroid function studies No results for input(s): TSH, T4TOTAL, T3FREE, THYROIDAB in the last 72 hours.  Invalid input(s): FREET3 Anemia work up No results for input(s): VITAMINB12, FOLATE, FERRITIN, TIBC, IRON, RETICCTPCT in the last 72 hours. Urinalysis    Component Value Date/Time   COLORURINE YELLOW 01/06/2018 1119   APPEARANCEUR CLEAR 01/06/2018 1119   LABSPEC 1.014 01/06/2018 1119   PHURINE 7.0 01/06/2018 1119   GLUCOSEU NEGATIVE 01/06/2018 1119   HGBUR SMALL (A) 01/06/2018 1119   BILIRUBINUR NEGATIVE 01/06/2018 1119   KETONESUR 5 (A) 01/06/2018 1119   PROTEINUR NEGATIVE 01/06/2018 1119   NITRITE NEGATIVE 01/06/2018 1119   LEUKOCYTESUR NEGATIVE 01/06/2018 1119   Sepsis Labs Invalid input(s): PROCALCITONIN,  WBC,  LACTICIDVEN Microbiology Recent Results (from the past 240 hour(s))  Group A Strep by PCR     Status: None   Collection Time: 01/06/18 11:17 AM  Result Value Ref Range Status   Group A Strep by PCR NOT DETECTED NOT DETECTED Final    Comment: Performed at [facility], [address] [place], [place] [address]  Culture, blood (Routine X 2) w Reflex to ID Panel     Status: None (Preliminary result)   Collection Time: 01/06/18 11:43 AM  Result Value Ref Range Status   Specimen Description LEFT ANTECUBITAL  Final   Special Requests   Final    BOTTLES DRAWN AEROBIC AND ANAEROBIC Blood Culture adequate volume   Culture   Final     NO GROWTH 3 DAYS Performed at [facility], [address] [place], [place] [address]    Report Status PENDING  Incomplete  Culture, blood (Routine X 2) w Reflex to ID Panel     Status: None (Preliminary result)   Collection Time: 01/06/18 11:44 AM  Result Value Ref Range Status   Specimen Description RIGHT ANTECUBITAL  Final   Special Requests   Final  BOTTLES DRAWN AEROBIC AND ANAEROBIC Blood Culture adequate volume   Culture   Final    NO GROWTH 3 DAYS Performed at [facility], [address] [place], [place] [address]    Report Status PENDING  Incomplete     Time coordinating discharge: 35 minutes  SIGNED:   Erick Blinks, DO [facility] 01/09/2018, 12:30 PM Pager [phone]  If 7PM-7AM, please contact night-coverage [email or web address] Password [email or web address]

## 2018-01-11 LAB — CULTURE, BLOOD (ROUTINE X 2)
CULTURE: NO GROWTH
Culture: NO GROWTH
Special Requests: ADEQUATE
Special Requests: ADEQUATE

## 2018-01-19 DIAGNOSIS — I639 Cerebral infarction, unspecified: Secondary | ICD-10-CM | POA: Diagnosis not present

## 2018-01-19 DIAGNOSIS — F419 Anxiety disorder, unspecified: Secondary | ICD-10-CM | POA: Diagnosis not present

## 2018-01-19 DIAGNOSIS — K5792 Diverticulitis of intestine, part unspecified, without perforation or abscess without bleeding: Secondary | ICD-10-CM | POA: Diagnosis not present

## 2018-01-19 DIAGNOSIS — Z6829 Body mass index (BMI) 29.0-29.9, adult: Secondary | ICD-10-CM | POA: Diagnosis not present

## 2018-01-19 DIAGNOSIS — Z299 Encounter for prophylactic measures, unspecified: Secondary | ICD-10-CM | POA: Diagnosis not present

## 2018-01-19 DIAGNOSIS — I10 Essential (primary) hypertension: Secondary | ICD-10-CM | POA: Diagnosis not present

## 2018-01-19 DIAGNOSIS — Z789 Other specified health status: Secondary | ICD-10-CM | POA: Diagnosis not present

## 2018-01-26 DIAGNOSIS — Z299 Encounter for prophylactic measures, unspecified: Secondary | ICD-10-CM | POA: Diagnosis not present

## 2018-01-26 DIAGNOSIS — K5792 Diverticulitis of intestine, part unspecified, without perforation or abscess without bleeding: Secondary | ICD-10-CM | POA: Diagnosis not present

## 2018-01-26 DIAGNOSIS — Z6829 Body mass index (BMI) 29.0-29.9, adult: Secondary | ICD-10-CM | POA: Diagnosis not present

## 2018-01-26 DIAGNOSIS — I10 Essential (primary) hypertension: Secondary | ICD-10-CM | POA: Diagnosis not present

## 2018-01-31 ENCOUNTER — Other Ambulatory Visit: Payer: Self-pay | Admitting: Pharmacist

## 2018-01-31 NOTE — Patient Outreach (Signed)
[facility]) Care Management  01/31/2018  Tamara Roberts [birth year] [id number]   Outreach call to [name] regarding her request for follow up from the [facility] Medication Adherence Campaign. No answer; unable to leave a message as voicemail box is full.  Duanne Moron, PharmD, [facility] Clinical Pharmacist [facility] Care Management [phone]

## 2018-02-28 DIAGNOSIS — H43813 Vitreous degeneration, bilateral: Secondary | ICD-10-CM | POA: Diagnosis not present

## 2018-02-28 DIAGNOSIS — H43392 Other vitreous opacities, left eye: Secondary | ICD-10-CM | POA: Diagnosis not present

## 2018-04-24 DIAGNOSIS — J069 Acute upper respiratory infection, unspecified: Secondary | ICD-10-CM | POA: Diagnosis not present

## 2018-04-24 DIAGNOSIS — E7800 Pure hypercholesterolemia, unspecified: Secondary | ICD-10-CM | POA: Diagnosis not present

## 2018-04-24 DIAGNOSIS — Z6829 Body mass index (BMI) 29.0-29.9, adult: Secondary | ICD-10-CM | POA: Diagnosis not present

## 2018-04-24 DIAGNOSIS — M7061 Trochanteric bursitis, right hip: Secondary | ICD-10-CM | POA: Diagnosis not present

## 2018-04-24 DIAGNOSIS — I10 Essential (primary) hypertension: Secondary | ICD-10-CM | POA: Diagnosis not present

## 2018-04-24 DIAGNOSIS — Z299 Encounter for prophylactic measures, unspecified: Secondary | ICD-10-CM | POA: Diagnosis not present

## 2018-04-25 ENCOUNTER — Other Ambulatory Visit: Payer: Self-pay | Admitting: Adult Health

## 2018-05-18 DIAGNOSIS — H02831 Dermatochalasis of right upper eyelid: Secondary | ICD-10-CM | POA: Diagnosis not present

## 2018-05-18 DIAGNOSIS — H43813 Vitreous degeneration, bilateral: Secondary | ICD-10-CM | POA: Diagnosis not present

## 2018-05-18 DIAGNOSIS — H25813 Combined forms of age-related cataract, bilateral: Secondary | ICD-10-CM | POA: Diagnosis not present

## 2018-06-07 ENCOUNTER — Ambulatory Visit: Payer: PPO | Admitting: Cardiology

## 2018-06-28 DIAGNOSIS — M25511 Pain in right shoulder: Secondary | ICD-10-CM | POA: Diagnosis not present

## 2018-06-28 DIAGNOSIS — I2510 Atherosclerotic heart disease of native coronary artery without angina pectoris: Secondary | ICD-10-CM | POA: Diagnosis not present

## 2018-06-28 DIAGNOSIS — Z6829 Body mass index (BMI) 29.0-29.9, adult: Secondary | ICD-10-CM | POA: Diagnosis not present

## 2018-06-28 DIAGNOSIS — I10 Essential (primary) hypertension: Secondary | ICD-10-CM | POA: Diagnosis not present

## 2018-06-28 DIAGNOSIS — G459 Transient cerebral ischemic attack, unspecified: Secondary | ICD-10-CM | POA: Diagnosis not present

## 2018-06-28 DIAGNOSIS — Z299 Encounter for prophylactic measures, unspecified: Secondary | ICD-10-CM | POA: Diagnosis not present

## 2018-06-28 DIAGNOSIS — I480 Paroxysmal atrial fibrillation: Secondary | ICD-10-CM | POA: Diagnosis not present

## 2018-07-04 DIAGNOSIS — I10 Essential (primary) hypertension: Secondary | ICD-10-CM | POA: Diagnosis not present

## 2018-07-04 DIAGNOSIS — Z6829 Body mass index (BMI) 29.0-29.9, adult: Secondary | ICD-10-CM | POA: Diagnosis not present

## 2018-07-04 DIAGNOSIS — Z299 Encounter for prophylactic measures, unspecified: Secondary | ICD-10-CM | POA: Diagnosis not present

## 2018-07-04 DIAGNOSIS — M7061 Trochanteric bursitis, right hip: Secondary | ICD-10-CM | POA: Diagnosis not present

## 2018-07-16 DIAGNOSIS — G459 Transient cerebral ischemic attack, unspecified: Secondary | ICD-10-CM | POA: Diagnosis not present

## 2018-07-16 DIAGNOSIS — I6350 Cerebral infarction due to unspecified occlusion or stenosis of unspecified cerebral artery: Secondary | ICD-10-CM | POA: Diagnosis not present

## 2018-07-23 DIAGNOSIS — H25812 Combined forms of age-related cataract, left eye: Secondary | ICD-10-CM | POA: Diagnosis not present

## 2018-07-23 DIAGNOSIS — H25811 Combined forms of age-related cataract, right eye: Secondary | ICD-10-CM | POA: Diagnosis not present

## 2018-08-06 ENCOUNTER — Telehealth: Payer: Self-pay | Admitting: Cardiology

## 2018-08-06 DIAGNOSIS — R252 Cramp and spasm: Secondary | ICD-10-CM | POA: Diagnosis not present

## 2018-08-06 DIAGNOSIS — Z299 Encounter for prophylactic measures, unspecified: Secondary | ICD-10-CM | POA: Diagnosis not present

## 2018-08-06 DIAGNOSIS — Z6830 Body mass index (BMI) 30.0-30.9, adult: Secondary | ICD-10-CM | POA: Diagnosis not present

## 2018-08-06 DIAGNOSIS — I10 Essential (primary) hypertension: Secondary | ICD-10-CM | POA: Diagnosis not present

## 2018-08-06 DIAGNOSIS — I639 Cerebral infarction, unspecified: Secondary | ICD-10-CM | POA: Diagnosis not present

## 2018-08-06 NOTE — Telephone Encounter (Signed)
Virtual Visit Pre-Appointment Phone Call  "(Name), I am calling you today to discuss your upcoming appointment. We are currently trying to limit exposure to the virus that causes COVID-19 by seeing patients at home rather than in the office."  "What is the BEST phone number to call the day of the visit?" -   [phone]  1. Do you have or have access to (through a family member/friend) a smartphone with video capability that we can use for your visit?" a. If yes - list this number in appt notes as cell (if different from BEST phone #) and list the appointment type as a VIDEO visit in appointment notes b. If no - list the appointment type as a PHONE visit in appointment notes  2. Confirm consent - "In the setting of the current Covid19 crisis, you are scheduled for a (phone or video) visit with your provider on (date) at (time).  Just as we do with many in-office visits, in order for you to participate in this visit, we must obtain consent.  If you'd like, I can send this to your mychart (if signed up) or email for you to review.  Otherwise, I can obtain your verbal consent now.  All virtual visits are billed to your insurance company just like a normal visit would be.  By agreeing to a virtual visit, we'd like you to understand that the technology does not allow for your provider to perform an examination, and thus may limit your provider's ability to fully assess your condition. If your provider identifies any concerns that need to be evaluated in person, we will make arrangements to do so.  Finally, though the technology is pretty good, we cannot assure that it will always work on either your or our end, and in the setting of a video visit, we may have to convert it to a phone-only visit.  In either situation, we cannot ensure that we have a secure connection.  Are you willing to proceed?" STAFF: Did the patient verbally acknowledge consent to telehealth visit? Document YES/NO here:   Yes 3.   4. Advise patient to be prepared - "Two hours prior to your appointment, go ahead and check your blood pressure, pulse, oxygen saturation, and your weight (if you have the equipment to check those) and write them all down. When your visit starts, your provider will ask you for this information. If you have an Apple Watch or Kardia device, please plan to have heart rate information ready on the day of your appointment. Please have a pen and paper handy nearby the day of the visit as well."  5. Give patient instructions for MyChart download to smartphone OR Doximity/[email or web address] as below if video visit (depending on what platform provider is using)  6. Inform patient they will receive a phone call 15 minutes prior to their appointment time (may be from unknown caller ID) so they should be prepared to answer    TELEPHONE CALL NOTE  Tamara Roberts has been deemed a candidate for a follow-up tele-health visit to limit community exposure during the Covid-19 pandemic. I spoke with the patient via phone to ensure availability of phone/video source, confirm preferred email & phone number, and discuss instructions and expectations.  I reminded Tamara Roberts to be prepared with any vital sign and/or heart rhythm information that could potentially be obtained via home monitoring, at the time of her visit. I reminded Tamara Roberts to expect a phone call prior to  her visit.  Donata DuffVicky T Slaughter 08/06/2018 4:04 PM   INSTRUCTIONS FOR DOWNLOADING THE MYCHART APP TO SMARTPHONE  - The patient must first make sure to have activated MyChart and know their login information - If Apple, go to [employer] Store and type in MyChart in the search bar and download the app. If Android, ask patient to go to [employer] Play Store and type in EmigrantMyChart in the search bar and download the app. The app is free but as with any other app downloads, their phone may require them to verify saved payment information or Apple/Android  password.  - The patient will need to then log into the app with their MyChart username and password, and select [place] as their healthcare provider to link the account. When it is time for your visit, go to the MyChart app, find appointments, and click Begin Video Visit. Be sure to Select Allow for your device to access the Microphone and Camera for your visit. You will then be connected, and your provider will be with you shortly.  **If they have any issues connecting, or need assistance please contact MyChart service desk [phone])**  **If using a computer, in order to ensure the best quality for their visit they will need to use either of the following Internet Browsers: [employer] Edge, or Google Chrome**  IF USING DOXIMITY or DOXY.ME - The patient will receive a link just prior to their visit by text.     FULL LENGTH CONSENT FOR TELE-HEALTH VISIT   I hereby voluntarily request, consent and authorize [facility] and its employed or contracted physicians, physician assistants, nurse practitioners or other licensed health care professionals (the Practitioner), to provide me with telemedicine health care services (the Services") as deemed necessary by the treating Practitioner. I acknowledge and consent to receive the Services by the Practitioner via telemedicine. I understand that the telemedicine visit will involve communicating with the Practitioner through live audiovisual communication technology and the disclosure of certain medical information by electronic transmission. I acknowledge that I have been given the opportunity to request an in-person assessment or other available alternative prior to the telemedicine visit and am voluntarily participating in the telemedicine visit.  I understand that I have the right to withhold or withdraw my consent to the use of telemedicine in the course of my care at any time, without affecting my right to future care or treatment,  and that the Practitioner or I may terminate the telemedicine visit at any time. I understand that I have the right to inspect all information obtained and/or recorded in the course of the telemedicine visit and may receive copies of available information for a reasonable fee.  I understand that some of the potential risks of receiving the Services via telemedicine include:   Delay or interruption in medical evaluation due to technological equipment failure or disruption;  Information transmitted may not be sufficient (e.g. poor resolution of images) to allow for appropriate medical decision making by the Practitioner; and/or   In rare instances, security protocols could fail, causing a breach of personal health information.  Furthermore, I acknowledge that it is my responsibility to provide information about my medical history, conditions and care that is complete and accurate to the best of my ability. I acknowledge that Practitioner's advice, recommendations, and/or decision may be based on factors not within their control, such as incomplete or inaccurate data provided by me or distortions of diagnostic images or specimens that may result from electronic transmissions. I  understand that the practice of medicine is not an exact science and that Practitioner makes no warranties or guarantees regarding treatment outcomes. I acknowledge that I will receive a copy of this consent concurrently upon execution via email to the email address I last provided but may also request a printed copy by calling the office of [place].    I understand that my insurance will be billed for this visit.   I have read or had this consent read to me.  I understand the contents of this consent, which adequately explains the benefits and risks of the Services being provided via telemedicine.   I have been provided ample opportunity to ask questions regarding this consent and the Services and have had my questions  answered to my satisfaction.  I give my informed consent for the services to be provided through the use of telemedicine in my medical care  By participating in this telemedicine visit I agree to the above.

## 2018-08-08 ENCOUNTER — Encounter: Payer: Self-pay | Admitting: Cardiology

## 2018-08-08 ENCOUNTER — Telehealth (INDEPENDENT_AMBULATORY_CARE_PROVIDER_SITE_OTHER): Payer: PPO | Admitting: Cardiology

## 2018-08-08 VITALS — BP 132/78 | HR 60 | Ht 60.0 in | Wt 158.0 lb

## 2018-08-08 DIAGNOSIS — I480 Paroxysmal atrial fibrillation: Secondary | ICD-10-CM

## 2018-08-08 DIAGNOSIS — I10 Essential (primary) hypertension: Secondary | ICD-10-CM | POA: Diagnosis not present

## 2018-08-08 DIAGNOSIS — E782 Mixed hyperlipidemia: Secondary | ICD-10-CM | POA: Diagnosis not present

## 2018-08-08 NOTE — Patient Instructions (Addendum)

## 2018-08-08 NOTE — Progress Notes (Signed)
Virtual Visit via Telephone Note   This visit type was conducted due to national recommendations for restrictions regarding the COVID-19 Pandemic (e.g. social distancing) in an effort to limit this patient's exposure and mitigate transmission in our community.  Due to her co-morbid illnesses, this patient is at least at moderate risk for complications without adequate follow up.  This format is felt to be most appropriate for this patient at this time.  The patient did not have access to video technology/had technical difficulties with video requiring transitioning to audio format only (telephone).  All issues noted in this document were discussed and addressed.  No physical exam could be performed with this format.  Please refer to the patient's chart for her  consent to telehealth for [facility].   Date:  08/08/2018   ID:  Tamara Roberts, DOB 09-Nov-1940, MRN [id number]  Patient Location: Home Provider Location: Office  PCP:  Monico Blitz, MD  Cardiologist:  Rozann Lesches, MD Electrophysiologist:  None   Evaluation Performed:  Follow-Up Visit  Chief Complaint:   Cardiac follow-up  History of Present Illness:    Tamara Roberts is a 78 y.o. female last seen in November 2019.  She did not have video access and we spoke by phone today.  She describes occasional palpitations, nothing prolonged.  Reports that weakness is somewhat better after lowering Toprol-XL.  She does not report any bleeding problems on Eliquis and is to have lab work done with Dr. Manuella Ghazi this week.  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  She states that she has not gotten out of the house very much during the pandemic, but does not wear a mask when she goes out.  The patient does not have symptoms concerning for COVID-19 infection (fever, chills, cough, or new shortness of breath).    Past Medical History:  Diagnosis Date  . Bursitis   . Diverticulitis   . DJD  (degenerative joint disease), lumbar   . Essential hypertension   . GERD (gastroesophageal reflux disease)   . History of cardiac catheterization    March 2018 - minimal atherosclerosis  . Hyperlipidemia   . Irritable bowel syndrome   . Paroxysmal atrial fibrillation [facility])    Diagnosed April 2019  . Spondylolisthesis at L4-L5 level   . Stroke [facility])    Past Surgical History:  Procedure Laterality Date  . ABDOMINAL HYSTERECTOMY    . APPENDECTOMY    . BILATERAL SALPINGOOPHORECTOMY    . BREAST CYST ASPIRATION Left   . CHOLECYSTECTOMY    . HERNIA REPAIR    . LEFT HEART CATH AND CORONARY ANGIOGRAPHY N/A 04/01/2016   Procedure: Left Heart Cath and Coronary Angiography;  Surgeon: Peter M Martinique, MD;  Location: [place] CV LAB;  Service: Cardiovascular;  Laterality: N/A;  . LUMBAR SPINE SURGERY    . SHOULDER SURGERY Right      Current Meds  Medication Sig  . ALPRAZolam (XANAX) 0.5 MG tablet Take 0.5 mg by mouth at bedtime as needed.   . cholecalciferol (VITAMIN D) 1000 units tablet Take 1,000 Units by mouth daily.  Marland Kitchen ELIQUIS 5 MG TABS tablet TAKE (1) TABLET BY MOUTH TWICE DAILY.  . fluticasone (FLONASE) 50 MCG/ACT nasal spray Place 1 spray into both nostrils daily.  . metoprolol succinate (TOPROL-XL) 25 MG 24 hr tablet Take 0.5 tablets (12.5 mg total) by mouth daily.  . pramipexole (MIRAPEX) 0.125 MG tablet Take 0.125 mg by mouth at  bedtime.  . rosuvastatin (CRESTOR) 5 MG tablet Take 5 mg by mouth daily.     Allergies:   Feldene [piroxicam] and Codeine   Social History   Tobacco Use  . Smoking status: Never Smoker  . Smokeless tobacco: Never Used  Substance Use Topics  . Alcohol use: No  . Drug use: No     Family Hx: The patient's family history includes Alcoholism in her brother; Arthritis in her father; Cancer in her mother; Congestive Heart Failure in her daughter; Diabetes in her paternal grandmother; Drug abuse in her brother; Heart attack in her maternal grandfather;  Heart disease in her father.  ROS:   Please see the history of present illness. All other systems reviewed and are negative.   Prior CV studies:   The following studies were reviewed today:  Echocardiogram 04/15/2017: Study Conclusions  - Left ventricle: The cavity size was normal. Systolic function was   normal. The estimated ejection fraction was in the range of 55%   to 60%. Wall motion was normal; there were no regional wall   motion abnormalities. Features are consistent with a pseudonormal   left ventricular filling pattern, with concomitant abnormal   relaxation and increased filling pressure (grade 2 diastolic   dysfunction). - Mitral valve: There was mild to moderate regurgitation directed   centrally. - Left atrium: The atrium was mildly to moderately dilated.  Labs/Other Tests and Data Reviewed:    EKG:  An ECG dated 01/02/2018 was personally reviewed today and demonstrated:  Sinus rhythm with nonspecific ST changes.  Recent Labs: 01/07/2018: ALT 9 01/09/2018: BUN 12; Creatinine, Ser 1.25; Hemoglobin 11.6; Platelets 191; Potassium 4.1; Sodium 134   Recent Lipid Panel Lab Results  Component Value Date/Time   CHOL 246 (H) 04/01/2016 04:47 AM   TRIG 273 (H) 04/01/2016 04:47 AM   HDL 55 04/01/2016 04:47 AM   CHOLHDL 4.5 04/01/2016 04:47 AM   LDLCALC 136 (H) 04/01/2016 04:47 AM    Wt Readings from Last 3 Encounters:  08/08/18 158 lb (71.7 kg)  01/06/18 158 lb 8.2 oz (71.9 kg)  01/02/18 162 lb (73.5 kg)     Objective:    Vital Signs:  BP 132/78   Pulse 60   Ht 5' (1.524 m)   Wt 158 lb (71.7 kg)   BMI 30.86 kg/m    Patient spoke in full sentences, not short of breath. No audible wheezing or coughing. Speech pattern normal.  ASSESSMENT & PLAN:    1.  Paroxysmal atrial fibrillation with CHADSVASC score of 7.  Continue Eliquis for stroke prophylaxis.  She is getting follow-up lab work with PCP soon.  Continue Toprol-XL at 12.5 mg daily.  2.  Mixed  hyperlipidemia on Crestor.  Keep follow-up with Dr. Sherryll BurgerShah.  3.  Essential hypertension, systolic in the 130s today.  COVID-19 Education: The signs and symptoms of COVID-19 were discussed with the patient and how to seek care for testing (follow up with PCP or arrange E-visit).  The importance of social distancing was discussed today.  Time:   Today, I have spent 5 minutes with the patient with telehealth technology discussing the above problems.     Medication Adjustments/Labs and Tests Ordered: Current medicines are reviewed at length with the patient today.  Concerns regarding medicines are outlined above.   Tests Ordered: No orders of the defined types were placed in this encounter.   Medication Changes: No orders of the defined types were placed in this encounter.   Follow  Up:  In Person 6 months in the [place] office.  Signed, Nona DellSamuel Falecia Vannatter, MD  08/08/2018 2:22 PM    [facility]

## 2018-08-10 DIAGNOSIS — I10 Essential (primary) hypertension: Secondary | ICD-10-CM | POA: Diagnosis not present

## 2018-08-10 DIAGNOSIS — R252 Cramp and spasm: Secondary | ICD-10-CM | POA: Diagnosis not present

## 2018-09-05 ENCOUNTER — Encounter: Payer: Self-pay | Admitting: *Deleted

## 2018-09-18 ENCOUNTER — Telehealth: Payer: Self-pay | Admitting: *Deleted

## 2018-09-18 NOTE — Telephone Encounter (Signed)
-----   Message from Satira Sark, MD sent at 09/13/2018 12:42 PM EDT ----- Results reviewed. Hemoglobin normal. Continue current dose of Eliquis.

## 2018-09-26 ENCOUNTER — Encounter: Payer: Self-pay | Admitting: *Deleted

## 2018-09-26 NOTE — Telephone Encounter (Signed)
-----   Message from Satira Sark, MD sent at 09/13/2018 12:42 PM EDT ----- Results reviewed. Hemoglobin normal. Continue current dose of Eliquis.

## 2018-09-26 NOTE — Telephone Encounter (Signed)
Letter mailed with results. 

## 2018-10-04 DIAGNOSIS — Z6830 Body mass index (BMI) 30.0-30.9, adult: Secondary | ICD-10-CM | POA: Diagnosis not present

## 2018-10-04 DIAGNOSIS — M25511 Pain in right shoulder: Secondary | ICD-10-CM | POA: Diagnosis not present

## 2018-10-04 DIAGNOSIS — I10 Essential (primary) hypertension: Secondary | ICD-10-CM | POA: Diagnosis not present

## 2018-10-04 DIAGNOSIS — Z23 Encounter for immunization: Secondary | ICD-10-CM | POA: Diagnosis not present

## 2018-10-04 DIAGNOSIS — Z299 Encounter for prophylactic measures, unspecified: Secondary | ICD-10-CM | POA: Diagnosis not present

## 2018-10-04 IMAGING — CT CT ABD-PELV W/ CM
2 of 5 series · 16 of 46 positions shown, 18 images · IV contrast (Isovue)
Comparison: CT abdomen pelvis dated October 28, 2016.

CLINICAL DATA: Right upper quadrant pain after fall 1 week ago.

EXAM:
CT ABDOMEN AND PELVIS WITH CONTRAST
TECHNIQUE: Multidetector CT imaging of the abdomen and pelvis was performed
using the standard protocol following bolus administration of
intravenous contrast.
CONTRAST:  100mL V4R13Z-6II IOPAMIDOL (V4R13Z-6II) INJECTION 61%

[Series 2: axial st · axial · 0.67mm/px · z∈[-826,-426]mm · 13 of 91 slices shown, 15 images]
[im 6/91  soft-tissue]
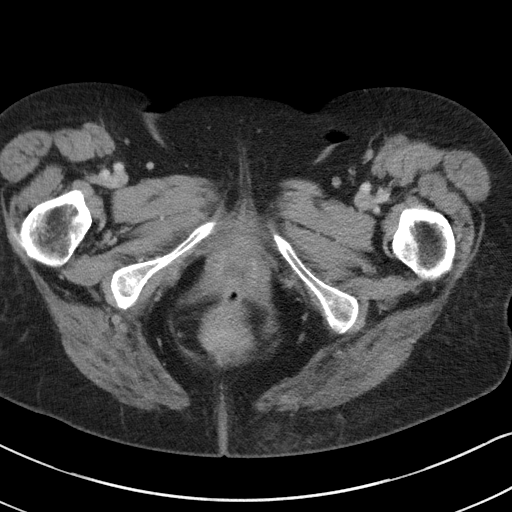
[im 6/91  bone]
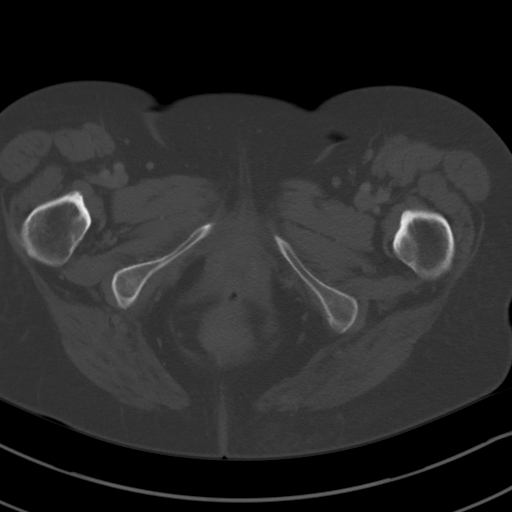
[im 11/91  soft-tissue]
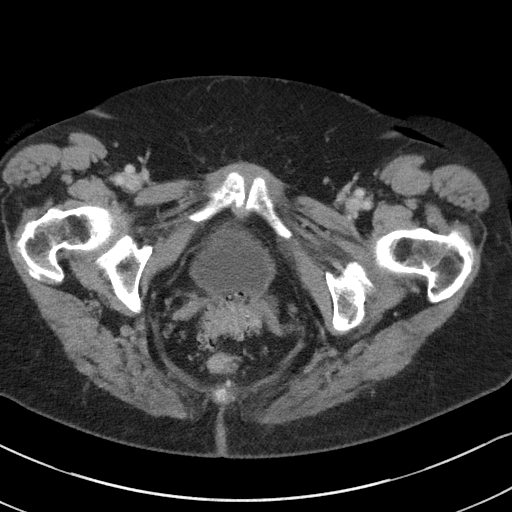
[im 21/91  soft-tissue]
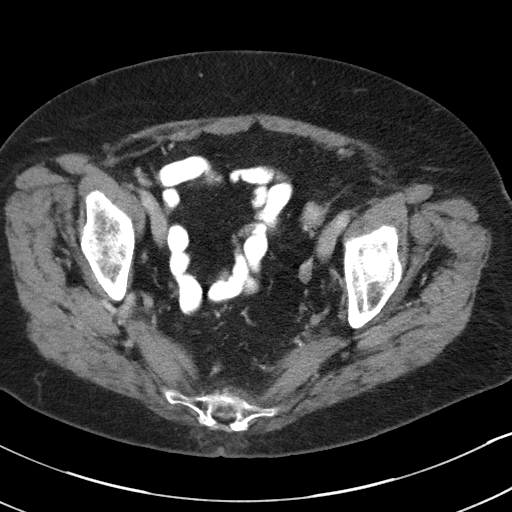
[im 26/91  soft-tissue]
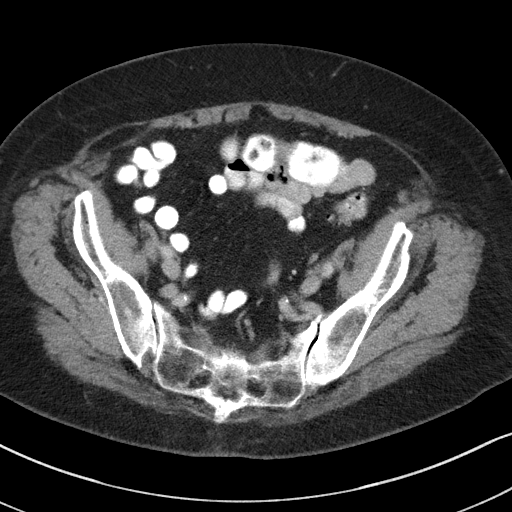
[im 31/91  soft-tissue]
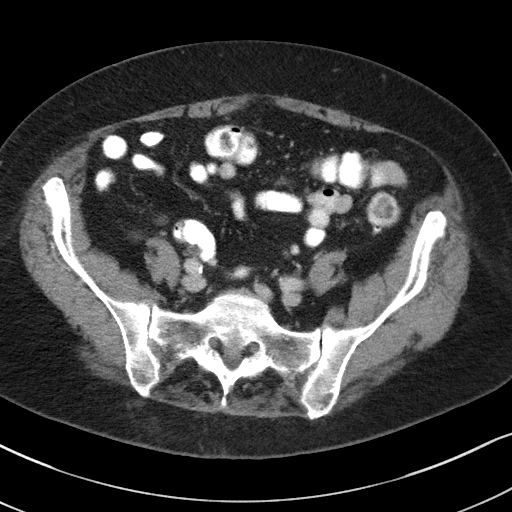
[im 41/91  soft-tissue]
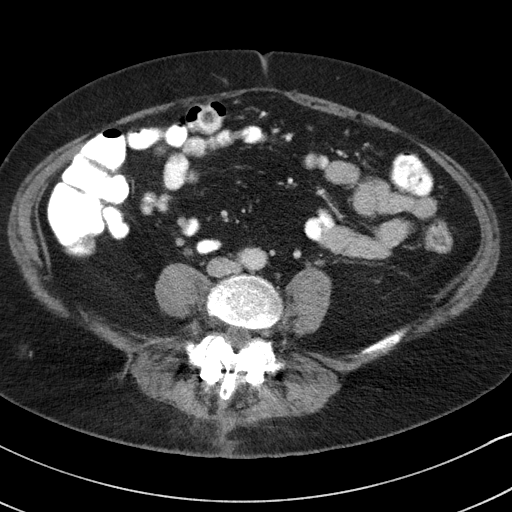
[im 46/91  soft-tissue]
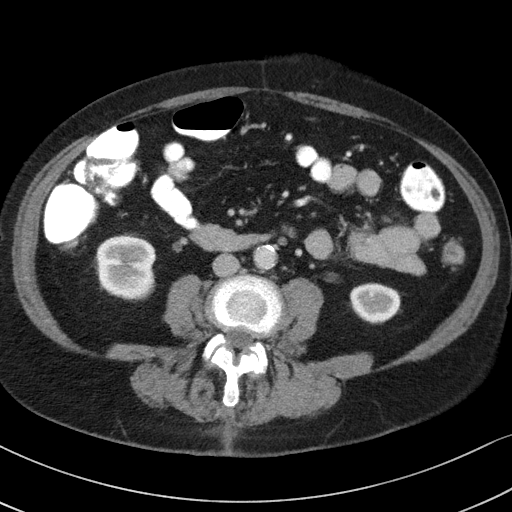
[im 51/91  soft-tissue]
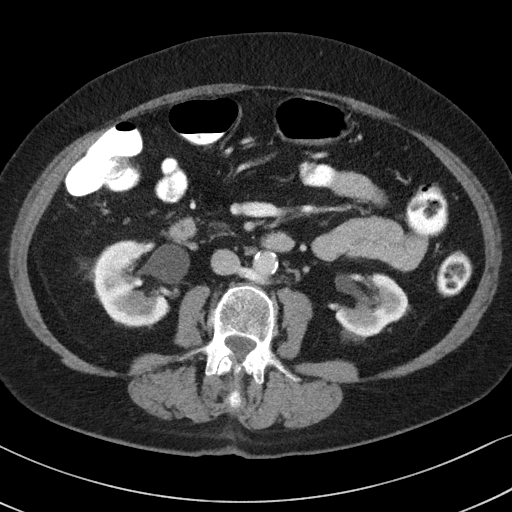
[im 61/91  soft-tissue]
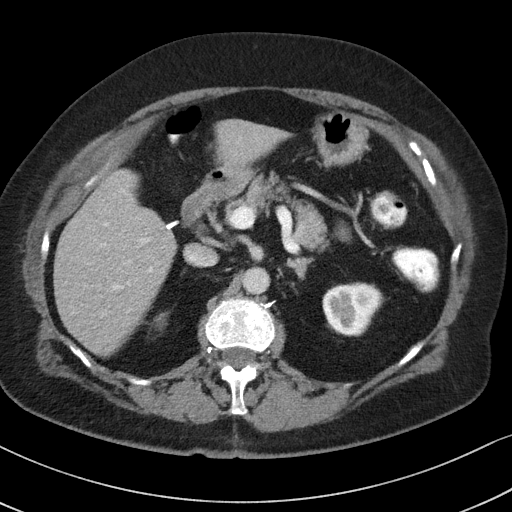
[im 61/91  bone]
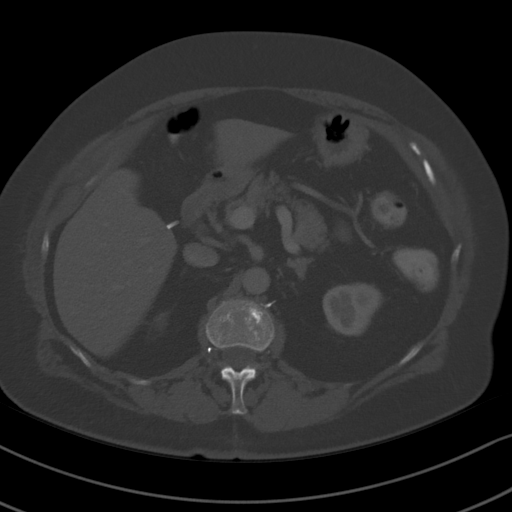
[im 66/91  soft-tissue]
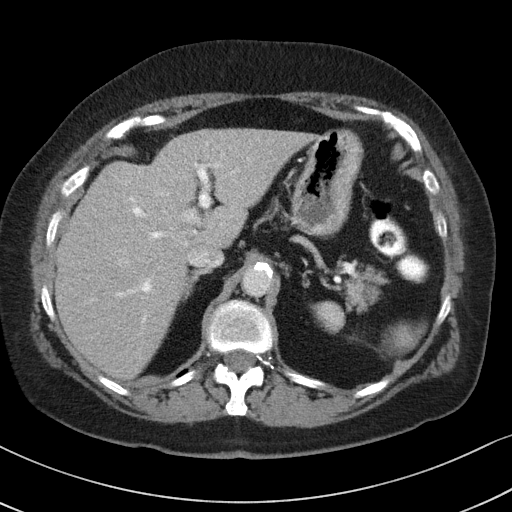
[im 71/91  soft-tissue]
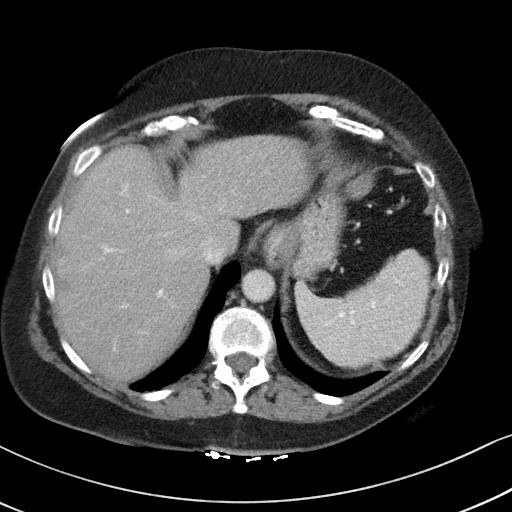
[im 81/91  soft-tissue]
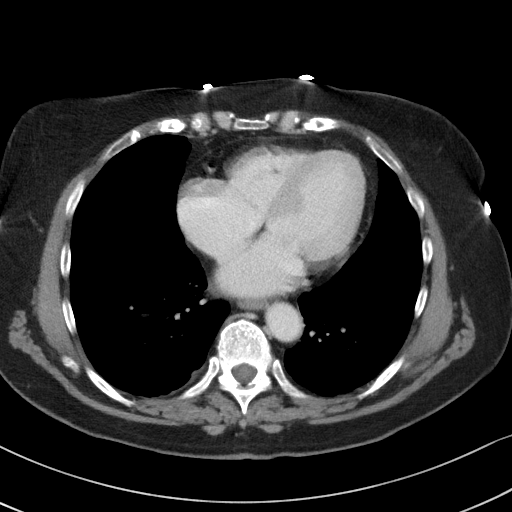
[im 86/91  soft-tissue]
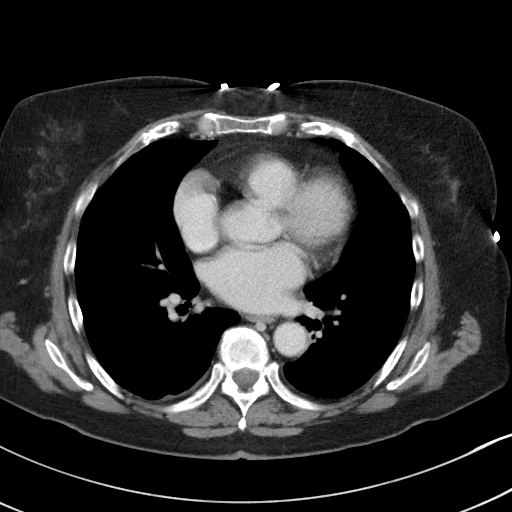

[Series 4: coronal st · coronal · 0.71mm/px · 3 of 96 slices shown]
[im 32/96  soft-tissue]
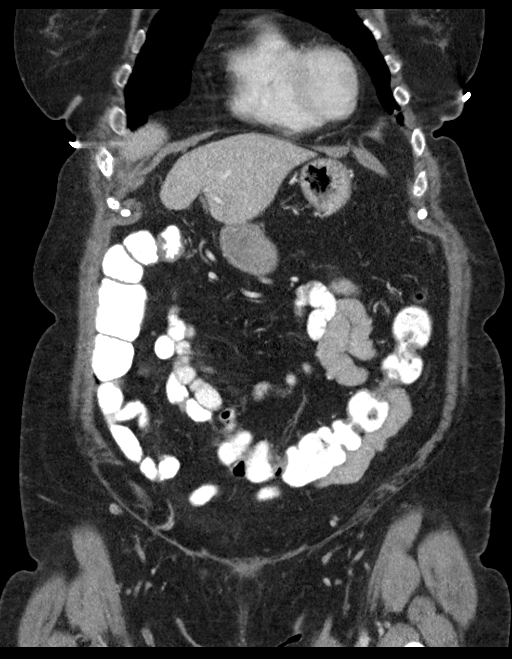
[im 43/96  soft-tissue]
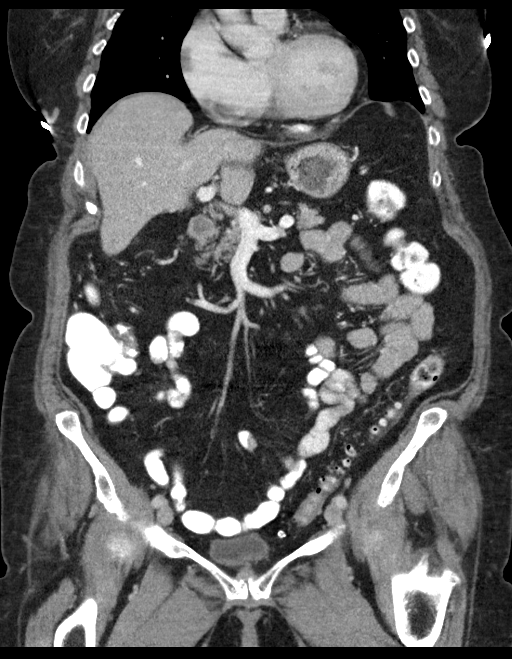
[im 53/96  soft-tissue]
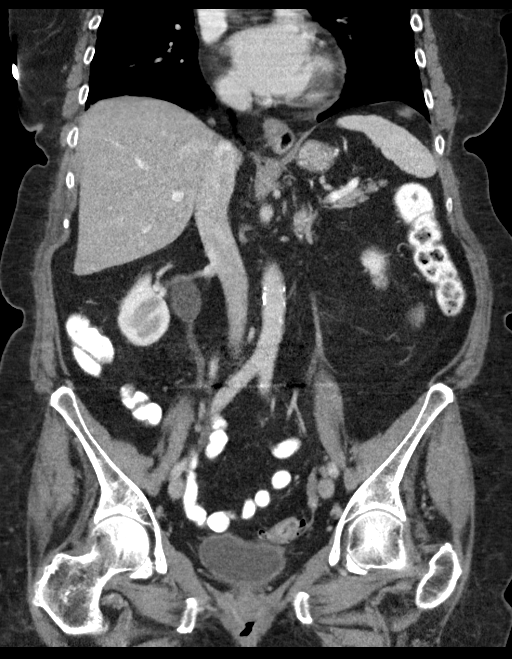

[16 of 46 positions shown; findings below may reference images not displayed]

FINDINGS: Lower chest: No acute abnormality.

Hepatobiliary: No focal liver abnormality is seen. Status post
cholecystectomy. No biliary dilatation.

Pancreas: Unremarkable. No pancreatic ductal dilatation or
surrounding inflammatory changes.

Spleen: Normal in size without focal abnormality.

Adrenals/Urinary Tract: The adrenal glands are unremarkable. Stable
small right renal cyst. Low-density lesions in the upper pole of the
left kidney remain too small to characterize but are unchanged.
Right extrarenal pelvis. No renal or ureteral calculi. No
hydronephrosis. The bladder is decompressed.

Stomach/Bowel: Small hiatal hernia. The stomach is otherwise within
normal limits. Appendix is surgically absent. No bowel wall
thickening, distention, or surrounding inflammatory changes.
Extensive sigmoid diverticulosis.

Vascular/Lymphatic: Aortic atherosclerosis. Retroaortic left renal
vein again noted. No enlarged abdominal or pelvic lymph nodes.

Reproductive: Status post hysterectomy. No adnexal masses.

Other: No abdominal wall hernia or abnormality. No abdominopelvic
ascites. No pneumoperitoneum.

Musculoskeletal: No acute or significant osseous findings. Prior
L4-L5 posterior fusion and vertebral augmentation at L1.
IMPRESSION: 1.  No acute intra-abdominal process.
2.  Aortic atherosclerosis (81FQI-DAG.G).

## 2018-10-11 DIAGNOSIS — Z6830 Body mass index (BMI) 30.0-30.9, adult: Secondary | ICD-10-CM | POA: Diagnosis not present

## 2018-10-11 DIAGNOSIS — Z7189 Other specified counseling: Secondary | ICD-10-CM | POA: Diagnosis not present

## 2018-10-11 DIAGNOSIS — I10 Essential (primary) hypertension: Secondary | ICD-10-CM | POA: Diagnosis not present

## 2018-10-11 DIAGNOSIS — Z1211 Encounter for screening for malignant neoplasm of colon: Secondary | ICD-10-CM | POA: Diagnosis not present

## 2018-10-11 DIAGNOSIS — Z299 Encounter for prophylactic measures, unspecified: Secondary | ICD-10-CM | POA: Diagnosis not present

## 2018-10-11 DIAGNOSIS — N183 Chronic kidney disease, stage 3 unspecified: Secondary | ICD-10-CM | POA: Diagnosis not present

## 2018-10-11 DIAGNOSIS — R5383 Other fatigue: Secondary | ICD-10-CM | POA: Diagnosis not present

## 2018-10-11 DIAGNOSIS — Z1331 Encounter for screening for depression: Secondary | ICD-10-CM | POA: Diagnosis not present

## 2018-10-11 DIAGNOSIS — Z79899 Other long term (current) drug therapy: Secondary | ICD-10-CM | POA: Diagnosis not present

## 2018-10-11 DIAGNOSIS — Z1339 Encounter for screening examination for other mental health and behavioral disorders: Secondary | ICD-10-CM | POA: Diagnosis not present

## 2018-10-11 DIAGNOSIS — E559 Vitamin D deficiency, unspecified: Secondary | ICD-10-CM | POA: Diagnosis not present

## 2018-10-11 DIAGNOSIS — R350 Frequency of micturition: Secondary | ICD-10-CM | POA: Diagnosis not present

## 2018-10-11 DIAGNOSIS — Z0000 Encounter for general adult medical examination without abnormal findings: Secondary | ICD-10-CM | POA: Diagnosis not present

## 2018-10-11 DIAGNOSIS — E7800 Pure hypercholesterolemia, unspecified: Secondary | ICD-10-CM | POA: Diagnosis not present

## 2018-10-18 ENCOUNTER — Other Ambulatory Visit: Payer: Self-pay | Admitting: Internal Medicine

## 2018-10-18 ENCOUNTER — Other Ambulatory Visit: Payer: Self-pay | Admitting: Cardiology

## 2018-10-18 DIAGNOSIS — Z1231 Encounter for screening mammogram for malignant neoplasm of breast: Secondary | ICD-10-CM

## 2018-12-07 ENCOUNTER — Ambulatory Visit
Admission: RE | Admit: 2018-12-07 | Discharge: 2018-12-07 | Disposition: A | Discharge status: Home / Self Care | Payer: PPO | Source: Ambulatory Visit | Attending: Internal Medicine | Admitting: Internal Medicine

## 2018-12-07 ENCOUNTER — Other Ambulatory Visit: Payer: Self-pay

## 2018-12-07 DIAGNOSIS — Z1231 Encounter for screening mammogram for malignant neoplasm of breast: Secondary | ICD-10-CM

## 2019-01-14 DIAGNOSIS — Z299 Encounter for prophylactic measures, unspecified: Secondary | ICD-10-CM | POA: Diagnosis not present

## 2019-01-14 DIAGNOSIS — M1990 Unspecified osteoarthritis, unspecified site: Secondary | ICD-10-CM | POA: Diagnosis not present

## 2019-01-14 DIAGNOSIS — Z6830 Body mass index (BMI) 30.0-30.9, adult: Secondary | ICD-10-CM | POA: Diagnosis not present

## 2019-01-14 DIAGNOSIS — N183 Chronic kidney disease, stage 3 unspecified: Secondary | ICD-10-CM | POA: Diagnosis not present

## 2019-01-14 DIAGNOSIS — Z789 Other specified health status: Secondary | ICD-10-CM | POA: Diagnosis not present

## 2019-01-14 DIAGNOSIS — R413 Other amnesia: Secondary | ICD-10-CM | POA: Diagnosis not present

## 2019-01-14 DIAGNOSIS — I10 Essential (primary) hypertension: Secondary | ICD-10-CM | POA: Diagnosis not present

## 2019-01-21 ENCOUNTER — Other Ambulatory Visit: Payer: Self-pay | Admitting: Cardiology

## 2019-01-23 DIAGNOSIS — R413 Other amnesia: Secondary | ICD-10-CM | POA: Diagnosis not present

## 2019-02-06 DIAGNOSIS — Z299 Encounter for prophylactic measures, unspecified: Secondary | ICD-10-CM | POA: Diagnosis not present

## 2019-02-06 DIAGNOSIS — D6859 Other primary thrombophilia: Secondary | ICD-10-CM | POA: Diagnosis not present

## 2019-02-06 DIAGNOSIS — I10 Essential (primary) hypertension: Secondary | ICD-10-CM | POA: Diagnosis not present

## 2019-02-06 DIAGNOSIS — Z6830 Body mass index (BMI) 30.0-30.9, adult: Secondary | ICD-10-CM | POA: Diagnosis not present

## 2019-02-06 DIAGNOSIS — Z789 Other specified health status: Secondary | ICD-10-CM | POA: Diagnosis not present

## 2019-02-06 DIAGNOSIS — R413 Other amnesia: Secondary | ICD-10-CM | POA: Diagnosis not present

## 2019-02-06 DIAGNOSIS — F320 Major depressive disorder, single episode, mild: Secondary | ICD-10-CM | POA: Diagnosis not present

## 2019-02-13 ENCOUNTER — Telehealth (INDEPENDENT_AMBULATORY_CARE_PROVIDER_SITE_OTHER): Payer: PPO | Admitting: Cardiology

## 2019-02-13 ENCOUNTER — Encounter: Payer: Self-pay | Admitting: Cardiology

## 2019-02-13 VITALS — BP 152/81 | HR 65 | Ht 60.0 in | Wt 152.0 lb

## 2019-02-13 DIAGNOSIS — I480 Paroxysmal atrial fibrillation: Secondary | ICD-10-CM | POA: Diagnosis not present

## 2019-02-13 DIAGNOSIS — I10 Essential (primary) hypertension: Secondary | ICD-10-CM

## 2019-02-13 DIAGNOSIS — D6869 Other thrombophilia: Secondary | ICD-10-CM

## 2019-02-13 DIAGNOSIS — Z79899 Other long term (current) drug therapy: Secondary | ICD-10-CM

## 2019-02-13 MED ORDER — LOSARTAN POTASSIUM 25 MG PO TABS: 12.5000 mg | ORAL_TABLET | Freq: Every day | ORAL | 3 refills | Status: DC

## 2019-02-13 MED ORDER — APIXABAN 5 MG PO TABS: ORAL_TABLET | ORAL | 6 refills | Status: DC

## 2019-02-13 MED ORDER — METOPROLOL SUCCINATE ER 25 MG PO TB24: 12.5000 mg | ORAL_TABLET | Freq: Every day | ORAL | 6 refills | Status: AC

## 2019-02-13 NOTE — Progress Notes (Signed)
Virtual Visit via Telephone Note   This visit type was conducted due to national recommendations for restrictions regarding the COVID-19 Pandemic (e.g. social distancing) in an effort to limit this patient's exposure and mitigate transmission in our community.  Due to her co-morbid illnesses, this patient is at least at moderate risk for complications without adequate follow up.  This format is felt to be most appropriate for this patient at this time.  The patient did not have access to video technology/had technical difficulties with video requiring transitioning to audio format only (telephone).  All issues noted in this document were discussed and addressed.  No physical exam could be performed with this format.  Please refer to the patient's chart for her  consent to telehealth for [facility].   Date:  02/13/2019   ID:  Tamara Roberts, DOB [birth year], MRN [id number]  Patient Location: Home Provider Location: Office  PCP:  Kirstie Peri, MD  Cardiologist:  Nona Dell, MD Electrophysiologist:  None   Evaluation Performed:  Follow-Up Visit  Chief Complaint:  Cardiac follow-up  History of Present Illness:    Tamara Roberts is a 79 y.o. female last assessed via telehealth encounter in August 2020.  We spoke by phone today.  She tells me that her husband has passed since we last spoke, he had had declining health.  She is doing reasonably well, does have support from other family members locally.  She still remains in her own home and functions with ADLs.  She does not report any palpitations at this time, no obvious bleeding problems on Eliquis.  Interval lab work is outlined below.  I did talk with her about updating her CBC and BMET on Eliquis.  Blood pressure has also been elevated, she states that her systolics are usually in the 150s.  We went over the remainder of her medications which are outlined below.  The patient does not have symptoms concerning for COVID-19  infection (fever, chills, cough, or new shortness of breath).    Past Medical History:  Diagnosis Date  . Bursitis   . Diverticulitis   . DJD (degenerative joint disease), lumbar   . Essential hypertension   . GERD (gastroesophageal reflux disease)   . History of cardiac catheterization    March 2018 - minimal atherosclerosis  . Hyperlipidemia   . Irritable bowel syndrome   . Paroxysmal atrial fibrillation [facility])    Diagnosed April 2019  . Spondylolisthesis at L4-L5 level   . Stroke [facility])    Past Surgical History:  Procedure Laterality Date  . ABDOMINAL HYSTERECTOMY    . APPENDECTOMY    . BILATERAL SALPINGOOPHORECTOMY    . BREAST CYST ASPIRATION Left   . CHOLECYSTECTOMY    . HERNIA REPAIR    . LEFT HEART CATH AND CORONARY ANGIOGRAPHY N/A 04/01/2016   Procedure: Left Heart Cath and Coronary Angiography;  Surgeon: Peter M Swaziland, MD;  Location: [facility] INVASIVE CV LAB;  Service: Cardiovascular;  Laterality: N/A;  . LUMBAR SPINE SURGERY    . SHOULDER SURGERY Right      Current Meds  Medication Sig  . ALPRAZolam (XANAX) 0.5 MG tablet Take 0.5 mg by mouth at bedtime as needed.   . celecoxib (CELEBREX) 200 MG capsule Take 200 mg by mouth daily.  . cholecalciferol (VITAMIN D) 1000 units tablet Take 1,000 Units by mouth daily.  Marland Kitchen ELIQUIS 5 MG TABS tablet TAKE (1) TABLET BY MOUTH TWICE DAILY.  . fluticasone (FLONASE) 50 MCG/ACT nasal spray Place  1 spray into both nostrils daily.  . metoprolol succinate (TOPROL-XL) 25 MG 24 hr tablet TAKE 1/2 TABLET BY MOUTH ONCE DAILY.  . rosuvastatin (CRESTOR) 5 MG tablet Take 5 mg by mouth daily.  . sertraline (ZOLOFT) 25 MG tablet Take 25 mg by mouth at bedtime.     Allergies:   Feldene [piroxicam] and Codeine   ROS:   Memory loss.  Prior CV studies:   The following studies were reviewed today:  Echocardiogram 04/15/2017: Study Conclusions  - Left ventricle: The cavity size was normal. Systolic function was normal. The estimated ejection  fraction was in the range of 55% to 60%. Wall motion was normal; there were no regional wall motion abnormalities. Features are consistent with a pseudonormal left ventricular filling pattern, with concomitant abnormal relaxation and increased filling pressure (grade 2 diastolic dysfunction). - Mitral valve: There was mild to moderate regurgitation directed centrally. - Left atrium: The atrium was mildly to moderately dilated.  Labs/Other Tests and Data Reviewed:    EKG:  An ECG dated 01/02/2018 was personally reviewed today and demonstrated:  Sinus rhythm with nonspecific ST changes.  Recent Labs:  I personally reviewed lab work from [facility] in August 2020: Hemoglobin 12.5, platelets 197, BUN 16, creatinine 1.23, potassium 4.0, AST 16, ALT 13, TSH 2.31, cholesterol 138, triglycerides 76, HDL 68, LDL 55  Wt Readings from Last 3 Encounters:  02/13/19 152 lb (68.9 kg)  08/08/18 158 lb (71.7 kg)  01/06/18 158 lb 8.2 oz (71.9 kg)     Objective:    Vital Signs:  BP (!) 152/81   Pulse 65   Ht 5' (1.524 m)   Wt 152 lb (68.9 kg)   BMI 29.69 kg/m    Patient spoke in complete sentences, not short of breath. No audible wheezing or coughing. Speech pattern normal.  ASSESSMENT & PLAN:    1.  Paroxysmal atrial fibrillation.  CHA2DS2-VASc score is 7.  She reports no obvious bleeding problems on Eliquis.  Continue Toprol-XL at current dose.  No palpitations reported.  2.  Secondary hypercoagulable state.  She is on Eliquis for stroke prophylaxis.  Check CBC and BMET.  3.  Essential hypertension, systolic has been running in the 150s per home checks.  We are starting Cozaar 12.5 mg once daily, will follow up with BMET as noted above.   Time:   Today, I have spent 8 minutes with the patient with telehealth technology discussing the above problems.     Medication Adjustments/Labs and Tests Ordered: Current medicines are reviewed at length with the patient  today.  Concerns regarding medicines are outlined above.   Tests Ordered: Orders Placed This Encounter  Procedures  . Basic metabolic panel  . CBC    Medication Changes: Meds ordered this encounter  Medications  . losartan (COZAAR) 25 MG tablet    Sig: Take 0.5 tablets (12.5 mg total) by mouth daily.    Dispense:  45 tablet    Refill:  3    02/13/2019 NEW    Follow Up:  In Person 6 months in the [place] office.  Signed, Rozann Lesches, MD  02/13/2019 10:15 AM    [place]

## 2019-02-13 NOTE — Patient Instructions (Addendum)
Medication Instructions:   Your physician has recommended you make the following change in your medication:   Start losartan 12.5 mg by mouth daily  Continue other medications the same  Labwork:  Your physician recommends that you return for non-fasting lab work in: 10 days to check your BMET & CBC. You may have this done at [facility] in [place]. Your lab orders have been faxed to their facility.  Testing/Procedures:  NONE  Follow-Up:  Your physician recommends that you schedule a follow-up appointment in: 6 months (office). You will receive a reminder letter in the mail in about 4 months reminding you to call and schedule your appointment. If you don't receive this letter, please contact our office.  Any Other Special Instructions Will Be Listed Below (If Applicable). Your physician has requested that you regularly monitor and record your blood pressure readings at home. Please use the same machine at the same time of day to check your readings and record them to bring to your follow-up visit.   If you need a refill on your cardiac medications before your next appointment, please call your pharmacy.

## 2019-02-25 DIAGNOSIS — I480 Paroxysmal atrial fibrillation: Secondary | ICD-10-CM | POA: Diagnosis not present

## 2019-02-25 DIAGNOSIS — Z79899 Other long term (current) drug therapy: Secondary | ICD-10-CM | POA: Diagnosis not present

## 2019-02-25 LAB — BASIC METABOLIC PANEL
BUN/Creatinine Ratio: 16 (calc) (ref 6–22)
BUN: 20 mg/dL (ref 7–25)
CO2: 24 mmol/L (ref 20–32)
Calcium: 9.2 mg/dL (ref 8.6–10.4)
Chloride: 109 mmol/L (ref 98–110)
Creat: 1.27 mg/dL — ABNORMAL HIGH (ref 0.60–0.93)
Glucose, Bld: 112 mg/dL (ref 65–139)
Potassium: 4.2 mmol/L (ref 3.5–5.3)
Sodium: 142 mmol/L (ref 135–146)

## 2019-02-25 LAB — CBC
HCT: 41.3 % (ref 35.0–45.0)
Hemoglobin: 13.9 g/dL (ref 11.7–15.5)
MCH: 30.2 pg (ref 27.0–33.0)
MCHC: 33.7 g/dL (ref 32.0–36.0)
MCV: 89.8 fL (ref 80.0–100.0)
MPV: 10.3 fL (ref 7.5–12.5)
Platelets: 223 10*3/uL (ref 140–400)
RBC: 4.6 10*6/uL (ref 3.80–5.10)
RDW: 12.5 % (ref 11.0–15.0)
WBC: 6.4 10*3/uL (ref 3.8–10.8)

## 2019-02-27 ENCOUNTER — Telehealth: Payer: Self-pay | Admitting: *Deleted

## 2019-02-27 NOTE — Telephone Encounter (Signed)
-----   Message from Jonelle Sidle, MD sent at 02/25/2019  4:23 PM EST ----- Results reviewed.  Hemoglobin normal at 13.9, renal function stable with creatinine 1.27 and normal potassium.  Continue with current medications and plan.

## 2019-02-27 NOTE — Telephone Encounter (Signed)
Patient informed. Copy sent to PCP °

## 2019-03-01 ENCOUNTER — Ambulatory Visit: Payer: PPO | Attending: Internal Medicine

## 2019-03-01 DIAGNOSIS — Z23 Encounter for immunization: Secondary | ICD-10-CM | POA: Insufficient documentation

## 2019-03-01 NOTE — Progress Notes (Signed)
   Covid-19 Vaccination Clinic  Name:  LATRICIA CERRITO    MRN: [id number] DOB: [birth year]  03/01/2019  Ms. Sharrar was observed post Covid-19 immunization for 30 minutes based on pre-vaccination screening without incidence. She was provided with Vaccine Information Sheet and instruction to access the V-Safe system.   Ms. Bumpus was instructed to call 911 with any severe reactions post vaccine: Marland Kitchen Difficulty breathing  . Swelling of your face and throat  . A fast heartbeat  . A bad rash all over your body  . Dizziness and weakness    Immunizations Administered    Name Date Dose VIS Date Route   Pfizer COVID-19 Vaccine 03/01/2019  3:39 PM 0.3 mL 12/14/2018 Intramuscular   Manufacturer: [employer], [employer]   Lot: [id number]   NDC: [address]-0

## 2019-03-06 DIAGNOSIS — I2510 Atherosclerotic heart disease of native coronary artery without angina pectoris: Secondary | ICD-10-CM | POA: Diagnosis not present

## 2019-03-06 DIAGNOSIS — I10 Essential (primary) hypertension: Secondary | ICD-10-CM | POA: Diagnosis not present

## 2019-03-06 DIAGNOSIS — Z299 Encounter for prophylactic measures, unspecified: Secondary | ICD-10-CM | POA: Diagnosis not present

## 2019-03-06 DIAGNOSIS — N183 Chronic kidney disease, stage 3 unspecified: Secondary | ICD-10-CM | POA: Diagnosis not present

## 2019-03-06 DIAGNOSIS — F320 Major depressive disorder, single episode, mild: Secondary | ICD-10-CM | POA: Diagnosis not present

## 2019-03-06 DIAGNOSIS — Z6830 Body mass index (BMI) 30.0-30.9, adult: Secondary | ICD-10-CM | POA: Diagnosis not present

## 2019-03-27 ENCOUNTER — Ambulatory Visit: Payer: PPO | Attending: Internal Medicine

## 2019-03-27 DIAGNOSIS — Z23 Encounter for immunization: Secondary | ICD-10-CM

## 2019-03-27 NOTE — Progress Notes (Signed)
   Covid-19 Vaccination Clinic  Name:  Tamara Roberts    MRN: [id number] DOB: 1940-12-31  03/27/2019  [name] was observed post Covid-19 immunization for 15 minutes without incident. She was provided with Vaccine Information Sheet and instruction to access the V-Safe system.   [name] was instructed to call 911 with any severe reactions post vaccine: Marland Kitchen Difficulty breathing  . Swelling of face and throat  . A fast heartbeat  . A bad rash all over body  . Dizziness and weakness   Immunizations Administered    Name Date Dose VIS Date Route   Pfizer COVID-19 Vaccine 03/27/2019  1:51 PM 0.3 mL 12/14/2018 Intramuscular   Manufacturer: [employer], [employer]   Lot: [id number]   NDC: [address]-3

## 2019-06-06 DIAGNOSIS — F320 Major depressive disorder, single episode, mild: Secondary | ICD-10-CM | POA: Diagnosis not present

## 2019-06-06 DIAGNOSIS — E559 Vitamin D deficiency, unspecified: Secondary | ICD-10-CM | POA: Diagnosis not present

## 2019-06-06 DIAGNOSIS — M1990 Unspecified osteoarthritis, unspecified site: Secondary | ICD-10-CM | POA: Diagnosis not present

## 2019-06-06 DIAGNOSIS — I10 Essential (primary) hypertension: Secondary | ICD-10-CM | POA: Diagnosis not present

## 2019-06-06 DIAGNOSIS — Z299 Encounter for prophylactic measures, unspecified: Secondary | ICD-10-CM | POA: Diagnosis not present

## 2019-06-20 DIAGNOSIS — Z299 Encounter for prophylactic measures, unspecified: Secondary | ICD-10-CM | POA: Diagnosis not present

## 2019-06-20 DIAGNOSIS — Z789 Other specified health status: Secondary | ICD-10-CM | POA: Diagnosis not present

## 2019-06-20 DIAGNOSIS — M25562 Pain in left knee: Secondary | ICD-10-CM | POA: Diagnosis not present

## 2019-06-20 DIAGNOSIS — I10 Essential (primary) hypertension: Secondary | ICD-10-CM | POA: Diagnosis not present

## 2019-06-21 DIAGNOSIS — M1711 Unilateral primary osteoarthritis, right knee: Secondary | ICD-10-CM | POA: Diagnosis not present

## 2019-06-21 DIAGNOSIS — I480 Paroxysmal atrial fibrillation: Secondary | ICD-10-CM | POA: Diagnosis not present

## 2019-06-21 DIAGNOSIS — I10 Essential (primary) hypertension: Secondary | ICD-10-CM | POA: Diagnosis not present

## 2019-06-21 DIAGNOSIS — I739 Peripheral vascular disease, unspecified: Secondary | ICD-10-CM | POA: Diagnosis not present

## 2019-06-21 DIAGNOSIS — M1710 Unilateral primary osteoarthritis, unspecified knee: Secondary | ICD-10-CM | POA: Diagnosis not present

## 2019-06-21 DIAGNOSIS — N183 Chronic kidney disease, stage 3 unspecified: Secondary | ICD-10-CM | POA: Diagnosis not present

## 2019-06-21 DIAGNOSIS — Z299 Encounter for prophylactic measures, unspecified: Secondary | ICD-10-CM | POA: Diagnosis not present

## 2019-06-21 DIAGNOSIS — Z789 Other specified health status: Secondary | ICD-10-CM | POA: Diagnosis not present

## 2019-07-12 DIAGNOSIS — N183 Chronic kidney disease, stage 3 unspecified: Secondary | ICD-10-CM | POA: Diagnosis not present

## 2019-07-12 DIAGNOSIS — Z6830 Body mass index (BMI) 30.0-30.9, adult: Secondary | ICD-10-CM | POA: Diagnosis not present

## 2019-07-12 DIAGNOSIS — D6859 Other primary thrombophilia: Secondary | ICD-10-CM | POA: Diagnosis not present

## 2019-07-12 DIAGNOSIS — I10 Essential (primary) hypertension: Secondary | ICD-10-CM | POA: Diagnosis not present

## 2019-07-12 DIAGNOSIS — Z299 Encounter for prophylactic measures, unspecified: Secondary | ICD-10-CM | POA: Diagnosis not present

## 2019-07-12 DIAGNOSIS — I739 Peripheral vascular disease, unspecified: Secondary | ICD-10-CM | POA: Diagnosis not present

## 2019-07-24 ENCOUNTER — Encounter: Payer: Self-pay | Admitting: Cardiology

## 2019-07-24 ENCOUNTER — Ambulatory Visit (INDEPENDENT_AMBULATORY_CARE_PROVIDER_SITE_OTHER): Payer: PPO | Admitting: Cardiology

## 2019-07-24 VITALS — BP 140/70 | HR 77 | Ht 60.0 in | Wt 165.6 lb

## 2019-07-24 DIAGNOSIS — I10 Essential (primary) hypertension: Secondary | ICD-10-CM

## 2019-07-24 DIAGNOSIS — I480 Paroxysmal atrial fibrillation: Secondary | ICD-10-CM | POA: Diagnosis not present

## 2019-07-24 NOTE — Patient Instructions (Addendum)

## 2019-07-24 NOTE — Progress Notes (Signed)
Cardiology Office Note  Date: 07/24/2019   ID: AKEIBA AXELSON, DOB 24-Dec-1940, MRN [id number]  PCP:  Kirstie Peri, MD  Cardiologist:  Nona Dell, MD Electrophysiologist:  None   Chief Complaint  Patient presents with  . Cardiac follow-up    History of Present Illness: Tamara Roberts is a 79 y.o. female last assessed via telehealth encounter in February.  She presents for a routine follow-up visit.  States that she has had no significant sense of palpitations or chest pain since last evaluation.  She reports compliance with her medications.  Continues to follow at [facility] internal medicine and anticipates lab work with a physical in October.  I reviewed her medications today.  She does not report any spontaneous bleeding problems on Eliquis.  She also continues on Toprol-XL 25 mg daily.  I personally reviewed her ECG which shows sinus rhythm with LVH.  I went over her lab work from February as outlined below.  Past Medical History:  Diagnosis Date  . Bursitis   . Diverticulitis   . DJD (degenerative joint disease), lumbar   . Essential hypertension   . GERD (gastroesophageal reflux disease)   . History of cardiac catheterization    March 2018 - minimal atherosclerosis  . Hyperlipidemia   . Irritable bowel syndrome   . Paroxysmal atrial fibrillation [facility])    Diagnosed April 2019  . Spondylolisthesis at L4-L5 level   . Stroke [facility])     Past Surgical History:  Procedure Laterality Date  . ABDOMINAL HYSTERECTOMY    . APPENDECTOMY    . BILATERAL SALPINGOOPHORECTOMY    . BREAST CYST ASPIRATION Left   . CHOLECYSTECTOMY    . HERNIA REPAIR    . LEFT HEART CATH AND CORONARY ANGIOGRAPHY N/A 04/01/2016   Procedure: Left Heart Cath and Coronary Angiography;  Surgeon: Peter M Swaziland, MD;  Location: [facility] INVASIVE CV LAB;  Service: Cardiovascular;  Laterality: N/A;  . LUMBAR SPINE SURGERY    . SHOULDER SURGERY Right     Current Outpatient Medications  Medication Sig  Dispense Refill  . apixaban (ELIQUIS) 5 MG TABS tablet TAKE (1) TABLET BY MOUTH TWICE DAILY. 60 tablet 6  . celecoxib (CELEBREX) 200 MG capsule Take 200 mg by mouth daily.    . cholecalciferol (VITAMIN D) 1000 units tablet Take 1,000 Units by mouth daily.    . fluticasone (FLONASE) 50 MCG/ACT nasal spray Place 1 spray into both nostrils as needed.     Marland Kitchen losartan (COZAAR) 50 MG tablet Take 50 mg by mouth daily.    . metoprolol succinate (TOPROL-XL) 25 MG 24 hr tablet Take 0.5 tablets (12.5 mg total) by mouth daily. 15 tablet 6  . pregabalin (LYRICA) 25 MG capsule Take 25 mg by mouth 2 (two) times daily.    . rosuvastatin (CRESTOR) 5 MG tablet Take 5 mg by mouth daily.    . sertraline (ZOLOFT) 25 MG tablet Take 25 mg by mouth at bedtime.     No current facility-administered medications for this visit.   Allergies:  Feldene [piroxicam] and Codeine   ROS:   No syncope.  Physical Exam: VS:  BP 140/70   Pulse 77   Ht 5' (1.524 m)   Wt 165 lb 9.6 oz (75.1 kg)   SpO2 98%   BMI 32.34 kg/m , BMI Body mass index is 32.34 kg/m.  Wt Readings from Last 3 Encounters:  07/24/19 165 lb 9.6 oz (75.1 kg)  02/13/19 152 lb (68.9 kg)  08/08/18  158 lb (71.7 kg)    General: Patient appears comfortable at rest. HEENT: Conjunctiva and lids normal, wearing a mask.   Neck: Supple, no elevated JVP or carotid bruits, no thyromegaly. Lungs: Clear to auscultation, nonlabored breathing at rest. Cardiac: Regular rate and rhythm, no S3 or significant systolic murmur, no pericardial rub. Extremities: No pitting edema, distal pulses 2+.  ECG:  An ECG dated 01/02/2018 was personally reviewed today and demonstrated:  Sinus rhythm with nonspecific ST changes.  Recent Labwork: 02/25/2019: BUN 20; Creat 1.27; Hemoglobin 13.9; Platelets 223; Potassium 4.2; Sodium 142     Component Value Date/Time   CHOL 246 (H) 04/01/2016 0447   TRIG 273 (H) 04/01/2016 0447   HDL 55 04/01/2016 0447   CHOLHDL 4.5 04/01/2016 0447     VLDL 55 (H) 04/01/2016 0447   LDLCALC 136 (H) 04/01/2016 0447    Other Studies Reviewed Today:  Echocardiogram 04/15/2017: Study Conclusions  - Left ventricle: The cavity size was normal. Systolic function was normal. The estimated ejection fraction was in the range of 55% to 60%. Wall motion was normal; there were no regional wall motion abnormalities. Features are consistent with a pseudonormal left ventricular filling pattern, with concomitant abnormal relaxation and increased filling pressure (grade 2 diastolic dysfunction). - Mitral valve: There was mild to moderate regurgitation directed centrally. - Left atrium: The atrium was mildly to moderately dilated.  Assessment and Plan:  1.  Paroxysmal atrial fibrillation with CHA2DS2-VASc score of 7.  She continues on Eliquis, reports no bleeding problems.  No significant palpitations on Toprol-XL.  ECG reviewed.  She will get follow-up lab work with PCP in October.  2.  Essential hypertension, systolic 140 today.  She remains on losartan.  No changes were made today.  Medication Adjustments/Labs and Tests Ordered: Current medicines are reviewed at length with the patient today.  Concerns regarding medicines are outlined above.   Tests Ordered: Orders Placed This Encounter  Procedures  . EKG 12-Lead    Medication Changes: No orders of the defined types were placed in this encounter.   Disposition:  Follow up 6 months in the [place] office.  Signed, Jonelle Sidle, MD, [facility] 07/24/2019 1:26 PM    [facility] at [facility] [address] [place], [place], [place] [address] Phone: [phone]; Fax: [phone]

## 2019-08-30 ENCOUNTER — Ambulatory Visit: Payer: PPO | Admitting: Cardiology

## 2019-09-05 ENCOUNTER — Other Ambulatory Visit: Payer: Self-pay

## 2019-09-05 ENCOUNTER — Other Ambulatory Visit: Payer: Self-pay | Admitting: Family Medicine

## 2019-09-05 ENCOUNTER — Other Ambulatory Visit (HOSPITAL_COMMUNITY): Payer: Self-pay | Admitting: Family Medicine

## 2019-09-05 ENCOUNTER — Ambulatory Visit (HOSPITAL_COMMUNITY)
Admission: RE | Admit: 2019-09-05 | Discharge: 2019-09-05 | Disposition: A | Discharge status: Home / Self Care | Payer: PPO | Source: Ambulatory Visit | Attending: Family Medicine | Admitting: Family Medicine

## 2019-09-05 DIAGNOSIS — M79661 Pain in right lower leg: Secondary | ICD-10-CM | POA: Diagnosis not present

## 2019-09-05 DIAGNOSIS — Z299 Encounter for prophylactic measures, unspecified: Secondary | ICD-10-CM | POA: Diagnosis not present

## 2019-09-05 DIAGNOSIS — S8011XA Contusion of right lower leg, initial encounter: Secondary | ICD-10-CM | POA: Diagnosis not present

## 2019-09-05 DIAGNOSIS — S8012XA Contusion of left lower leg, initial encounter: Secondary | ICD-10-CM | POA: Insufficient documentation

## 2019-09-05 DIAGNOSIS — M1990 Unspecified osteoarthritis, unspecified site: Secondary | ICD-10-CM | POA: Diagnosis not present

## 2019-09-05 DIAGNOSIS — I2510 Atherosclerotic heart disease of native coronary artery without angina pectoris: Secondary | ICD-10-CM | POA: Diagnosis not present

## 2019-09-05 DIAGNOSIS — M79604 Pain in right leg: Secondary | ICD-10-CM | POA: Diagnosis not present

## 2019-09-05 DIAGNOSIS — I10 Essential (primary) hypertension: Secondary | ICD-10-CM | POA: Diagnosis not present

## 2019-09-05 DIAGNOSIS — F329 Major depressive disorder, single episode, unspecified: Secondary | ICD-10-CM | POA: Diagnosis not present

## 2019-09-12 DIAGNOSIS — N183 Chronic kidney disease, stage 3 unspecified: Secondary | ICD-10-CM | POA: Diagnosis not present

## 2019-09-12 DIAGNOSIS — Z299 Encounter for prophylactic measures, unspecified: Secondary | ICD-10-CM | POA: Diagnosis not present

## 2019-09-12 DIAGNOSIS — I10 Essential (primary) hypertension: Secondary | ICD-10-CM | POA: Diagnosis not present

## 2019-09-12 DIAGNOSIS — E7800 Pure hypercholesterolemia, unspecified: Secondary | ICD-10-CM | POA: Diagnosis not present

## 2019-09-12 DIAGNOSIS — M2550 Pain in unspecified joint: Secondary | ICD-10-CM | POA: Diagnosis not present

## 2019-09-19 DIAGNOSIS — I10 Essential (primary) hypertension: Secondary | ICD-10-CM | POA: Diagnosis not present

## 2019-09-19 DIAGNOSIS — M25551 Pain in right hip: Secondary | ICD-10-CM | POA: Diagnosis not present

## 2019-09-19 DIAGNOSIS — I700 Atherosclerosis of aorta: Secondary | ICD-10-CM | POA: Diagnosis not present

## 2019-09-19 DIAGNOSIS — N183 Chronic kidney disease, stage 3 unspecified: Secondary | ICD-10-CM | POA: Diagnosis not present

## 2019-09-19 DIAGNOSIS — Z6830 Body mass index (BMI) 30.0-30.9, adult: Secondary | ICD-10-CM | POA: Diagnosis not present

## 2019-09-19 DIAGNOSIS — Z299 Encounter for prophylactic measures, unspecified: Secondary | ICD-10-CM | POA: Diagnosis not present

## 2019-10-16 DIAGNOSIS — R5383 Other fatigue: Secondary | ICD-10-CM | POA: Diagnosis not present

## 2019-10-16 DIAGNOSIS — Z299 Encounter for prophylactic measures, unspecified: Secondary | ICD-10-CM | POA: Diagnosis not present

## 2019-10-16 DIAGNOSIS — E559 Vitamin D deficiency, unspecified: Secondary | ICD-10-CM | POA: Diagnosis not present

## 2019-10-16 DIAGNOSIS — E7800 Pure hypercholesterolemia, unspecified: Secondary | ICD-10-CM | POA: Diagnosis not present

## 2019-10-16 DIAGNOSIS — Z79899 Other long term (current) drug therapy: Secondary | ICD-10-CM | POA: Diagnosis not present

## 2019-10-16 DIAGNOSIS — Z7189 Other specified counseling: Secondary | ICD-10-CM | POA: Diagnosis not present

## 2019-10-16 DIAGNOSIS — Z1331 Encounter for screening for depression: Secondary | ICD-10-CM | POA: Diagnosis not present

## 2019-10-16 DIAGNOSIS — Z1339 Encounter for screening examination for other mental health and behavioral disorders: Secondary | ICD-10-CM | POA: Diagnosis not present

## 2019-10-16 DIAGNOSIS — Z6830 Body mass index (BMI) 30.0-30.9, adult: Secondary | ICD-10-CM | POA: Diagnosis not present

## 2019-10-16 DIAGNOSIS — M79606 Pain in leg, unspecified: Secondary | ICD-10-CM | POA: Diagnosis not present

## 2019-10-16 DIAGNOSIS — I10 Essential (primary) hypertension: Secondary | ICD-10-CM | POA: Diagnosis not present

## 2019-10-16 DIAGNOSIS — Z0000 Encounter for general adult medical examination without abnormal findings: Secondary | ICD-10-CM | POA: Diagnosis not present

## 2019-10-21 DIAGNOSIS — I70211 Atherosclerosis of native arteries of extremities with intermittent claudication, right leg: Secondary | ICD-10-CM | POA: Diagnosis not present

## 2019-10-21 DIAGNOSIS — Z049 Encounter for examination and observation for unspecified reason: Secondary | ICD-10-CM | POA: Diagnosis not present

## 2019-10-31 DIAGNOSIS — I739 Peripheral vascular disease, unspecified: Secondary | ICD-10-CM | POA: Diagnosis not present

## 2019-10-31 DIAGNOSIS — N183 Chronic kidney disease, stage 3 unspecified: Secondary | ICD-10-CM | POA: Diagnosis not present

## 2019-10-31 DIAGNOSIS — Z299 Encounter for prophylactic measures, unspecified: Secondary | ICD-10-CM | POA: Diagnosis not present

## 2019-10-31 DIAGNOSIS — E559 Vitamin D deficiency, unspecified: Secondary | ICD-10-CM | POA: Diagnosis not present

## 2019-10-31 DIAGNOSIS — M25551 Pain in right hip: Secondary | ICD-10-CM | POA: Diagnosis not present

## 2019-10-31 DIAGNOSIS — Z6830 Body mass index (BMI) 30.0-30.9, adult: Secondary | ICD-10-CM | POA: Diagnosis not present

## 2019-10-31 DIAGNOSIS — I10 Essential (primary) hypertension: Secondary | ICD-10-CM | POA: Diagnosis not present

## 2019-10-31 DIAGNOSIS — Z23 Encounter for immunization: Secondary | ICD-10-CM | POA: Diagnosis not present

## 2019-11-14 DIAGNOSIS — I480 Paroxysmal atrial fibrillation: Secondary | ICD-10-CM | POA: Diagnosis not present

## 2019-11-14 DIAGNOSIS — M25551 Pain in right hip: Secondary | ICD-10-CM | POA: Diagnosis not present

## 2019-11-14 DIAGNOSIS — Z299 Encounter for prophylactic measures, unspecified: Secondary | ICD-10-CM | POA: Diagnosis not present

## 2019-11-14 DIAGNOSIS — F419 Anxiety disorder, unspecified: Secondary | ICD-10-CM | POA: Diagnosis not present

## 2019-11-14 DIAGNOSIS — D6859 Other primary thrombophilia: Secondary | ICD-10-CM | POA: Diagnosis not present

## 2019-11-19 DIAGNOSIS — E2839 Other primary ovarian failure: Secondary | ICD-10-CM | POA: Diagnosis not present

## 2019-11-27 ENCOUNTER — Other Ambulatory Visit: Payer: Self-pay | Admitting: Internal Medicine

## 2019-11-27 DIAGNOSIS — Z1231 Encounter for screening mammogram for malignant neoplasm of breast: Secondary | ICD-10-CM

## 2019-12-04 DIAGNOSIS — M1611 Unilateral primary osteoarthritis, right hip: Secondary | ICD-10-CM | POA: Diagnosis not present

## 2019-12-04 DIAGNOSIS — M25451 Effusion, right hip: Secondary | ICD-10-CM | POA: Diagnosis not present

## 2019-12-04 DIAGNOSIS — M25559 Pain in unspecified hip: Secondary | ICD-10-CM | POA: Diagnosis not present

## 2019-12-04 DIAGNOSIS — M25561 Pain in right knee: Secondary | ICD-10-CM | POA: Diagnosis not present

## 2019-12-04 DIAGNOSIS — M778 Other enthesopathies, not elsewhere classified: Secondary | ICD-10-CM | POA: Diagnosis not present

## 2019-12-16 DIAGNOSIS — M1611 Unilateral primary osteoarthritis, right hip: Secondary | ICD-10-CM | POA: Diagnosis not present

## 2019-12-16 DIAGNOSIS — M961 Postlaminectomy syndrome, not elsewhere classified: Secondary | ICD-10-CM | POA: Diagnosis not present

## 2019-12-24 DIAGNOSIS — M25551 Pain in right hip: Secondary | ICD-10-CM | POA: Diagnosis not present

## 2020-01-08 DIAGNOSIS — M533 Sacrococcygeal disorders, not elsewhere classified: Secondary | ICD-10-CM | POA: Diagnosis not present

## 2020-01-13 ENCOUNTER — Ambulatory Visit: Payer: PPO

## 2020-01-15 DIAGNOSIS — M533 Sacrococcygeal disorders, not elsewhere classified: Secondary | ICD-10-CM | POA: Diagnosis not present

## 2020-03-18 ENCOUNTER — Other Ambulatory Visit: Payer: Self-pay | Admitting: Cardiology

## 2020-04-09 DIAGNOSIS — E7800 Pure hypercholesterolemia, unspecified: Secondary | ICD-10-CM | POA: Diagnosis not present

## 2020-04-09 DIAGNOSIS — Z6830 Body mass index (BMI) 30.0-30.9, adult: Secondary | ICD-10-CM | POA: Diagnosis not present

## 2020-04-09 DIAGNOSIS — Z789 Other specified health status: Secondary | ICD-10-CM | POA: Diagnosis not present

## 2020-04-09 DIAGNOSIS — J309 Allergic rhinitis, unspecified: Secondary | ICD-10-CM | POA: Diagnosis not present

## 2020-04-09 DIAGNOSIS — Z299 Encounter for prophylactic measures, unspecified: Secondary | ICD-10-CM | POA: Diagnosis not present

## 2020-04-09 DIAGNOSIS — I10 Essential (primary) hypertension: Secondary | ICD-10-CM | POA: Diagnosis not present

## 2020-05-04 DIAGNOSIS — N183 Chronic kidney disease, stage 3 unspecified: Secondary | ICD-10-CM | POA: Diagnosis not present

## 2020-05-04 DIAGNOSIS — I739 Peripheral vascular disease, unspecified: Secondary | ICD-10-CM | POA: Diagnosis not present

## 2020-05-04 DIAGNOSIS — J019 Acute sinusitis, unspecified: Secondary | ICD-10-CM | POA: Diagnosis not present

## 2020-05-04 DIAGNOSIS — T7840XA Allergy, unspecified, initial encounter: Secondary | ICD-10-CM | POA: Diagnosis not present

## 2020-05-04 DIAGNOSIS — Z299 Encounter for prophylactic measures, unspecified: Secondary | ICD-10-CM | POA: Diagnosis not present

## 2020-06-09 DIAGNOSIS — Z299 Encounter for prophylactic measures, unspecified: Secondary | ICD-10-CM | POA: Diagnosis not present

## 2020-06-09 DIAGNOSIS — Z789 Other specified health status: Secondary | ICD-10-CM | POA: Diagnosis not present

## 2020-06-09 DIAGNOSIS — Z6830 Body mass index (BMI) 30.0-30.9, adult: Secondary | ICD-10-CM | POA: Diagnosis not present

## 2020-06-09 DIAGNOSIS — Z713 Dietary counseling and surveillance: Secondary | ICD-10-CM | POA: Diagnosis not present

## 2020-06-09 DIAGNOSIS — R059 Cough, unspecified: Secondary | ICD-10-CM | POA: Diagnosis not present

## 2020-07-15 DIAGNOSIS — I480 Paroxysmal atrial fibrillation: Secondary | ICD-10-CM | POA: Diagnosis not present

## 2020-07-15 DIAGNOSIS — I700 Atherosclerosis of aorta: Secondary | ICD-10-CM | POA: Diagnosis not present

## 2020-07-15 DIAGNOSIS — N183 Chronic kidney disease, stage 3 unspecified: Secondary | ICD-10-CM | POA: Diagnosis not present

## 2020-07-15 DIAGNOSIS — Z299 Encounter for prophylactic measures, unspecified: Secondary | ICD-10-CM | POA: Diagnosis not present

## 2020-07-15 DIAGNOSIS — J040 Acute laryngitis: Secondary | ICD-10-CM | POA: Diagnosis not present

## 2020-07-15 DIAGNOSIS — Z789 Other specified health status: Secondary | ICD-10-CM | POA: Diagnosis not present

## 2020-07-19 ENCOUNTER — Encounter (HOSPITAL_COMMUNITY): Payer: Self-pay

## 2020-07-19 ENCOUNTER — Other Ambulatory Visit: Payer: Self-pay

## 2020-07-19 ENCOUNTER — Emergency Department (HOSPITAL_COMMUNITY)
Admission: EM | Admit: 2020-07-19 | Discharge: 2020-07-19 | Disposition: A | Discharge status: Home / Self Care | Payer: PPO | Attending: Emergency Medicine | Admitting: Emergency Medicine

## 2020-07-19 ENCOUNTER — Emergency Department (HOSPITAL_COMMUNITY): Payer: PPO

## 2020-07-19 DIAGNOSIS — Z20822 Contact with and (suspected) exposure to covid-19: Secondary | ICD-10-CM | POA: Insufficient documentation

## 2020-07-19 DIAGNOSIS — Z7901 Long term (current) use of anticoagulants: Secondary | ICD-10-CM | POA: Diagnosis not present

## 2020-07-19 DIAGNOSIS — R509 Fever, unspecified: Secondary | ICD-10-CM | POA: Diagnosis not present

## 2020-07-19 DIAGNOSIS — J029 Acute pharyngitis, unspecified: Secondary | ICD-10-CM | POA: Diagnosis not present

## 2020-07-19 DIAGNOSIS — J40 Bronchitis, not specified as acute or chronic: Secondary | ICD-10-CM | POA: Insufficient documentation

## 2020-07-19 DIAGNOSIS — Z79899 Other long term (current) drug therapy: Secondary | ICD-10-CM | POA: Insufficient documentation

## 2020-07-19 DIAGNOSIS — R059 Cough, unspecified: Secondary | ICD-10-CM | POA: Diagnosis not present

## 2020-07-19 DIAGNOSIS — I4891 Unspecified atrial fibrillation: Secondary | ICD-10-CM | POA: Diagnosis not present

## 2020-07-19 DIAGNOSIS — I10 Essential (primary) hypertension: Secondary | ICD-10-CM | POA: Insufficient documentation

## 2020-07-19 DIAGNOSIS — R0602 Shortness of breath: Secondary | ICD-10-CM | POA: Insufficient documentation

## 2020-07-19 LAB — CBC WITH DIFFERENTIAL/PLATELET
Abs Immature Granulocytes: 0.07 10*3/uL (ref 0.00–0.07)
Basophils Absolute: 0.1 10*3/uL (ref 0.0–0.1)
Basophils Relative: 1 %
Eosinophils Absolute: 0.5 10*3/uL (ref 0.0–0.5)
Eosinophils Relative: 5 %
HCT: 38 % (ref 36.0–46.0)
Hemoglobin: 12.6 g/dL (ref 12.0–15.0)
Immature Granulocytes: 1 %
Lymphocytes Relative: 30 %
Lymphs Abs: 2.7 10*3/uL (ref 0.7–4.0)
MCH: 30.4 pg (ref 26.0–34.0)
MCHC: 33.2 g/dL (ref 30.0–36.0)
MCV: 91.8 fL (ref 80.0–100.0)
Monocytes Absolute: 1.1 10*3/uL — ABNORMAL HIGH (ref 0.1–1.0)
Monocytes Relative: 12 %
Neutro Abs: 4.6 10*3/uL (ref 1.7–7.7)
Neutrophils Relative %: 51 %
Platelets: 233 10*3/uL (ref 150–400)
RBC: 4.14 MIL/uL (ref 3.87–5.11)
RDW: 14.4 % (ref 11.5–15.5)
WBC: 9 10*3/uL (ref 4.0–10.5)
nRBC: 0 % (ref 0.0–0.2)

## 2020-07-19 LAB — BRAIN NATRIURETIC PEPTIDE: B Natriuretic Peptide: 194 pg/mL — ABNORMAL HIGH (ref 0.0–100.0)

## 2020-07-19 LAB — COMPREHENSIVE METABOLIC PANEL
ALT: 13 U/L (ref 0–44)
AST: 14 U/L — ABNORMAL LOW (ref 15–41)
Albumin: 3.6 g/dL (ref 3.5–5.0)
Alkaline Phosphatase: 63 U/L (ref 38–126)
Anion gap: 10 (ref 5–15)
BUN: 19 mg/dL (ref 8–23)
CO2: 22 mmol/L (ref 22–32)
Calcium: 8.3 mg/dL — ABNORMAL LOW (ref 8.9–10.3)
Chloride: 104 mmol/L (ref 98–111)
Creatinine, Ser: 1.36 mg/dL — ABNORMAL HIGH (ref 0.44–1.00)
GFR, Estimated: 40 mL/min — ABNORMAL LOW (ref >60)
Glucose, Bld: 86 mg/dL (ref 70–99)
Potassium: 3.4 mmol/L — ABNORMAL LOW (ref 3.5–5.1)
Sodium: 136 mmol/L (ref 135–145)
Total Bilirubin: 0.4 mg/dL (ref 0.3–1.2)
Total Protein: 6.8 g/dL (ref 6.5–8.1)

## 2020-07-19 LAB — TROPONIN I (HIGH SENSITIVITY): Troponin I (High Sensitivity): 8 ng/L (ref <18)

## 2020-07-19 LAB — CK: Total CK: 97 U/L (ref 38–234)

## 2020-07-19 LAB — RESP PANEL BY RT-PCR (FLU A&B, COVID) ARPGX2
Influenza A by PCR: NEGATIVE
Influenza B by PCR: NEGATIVE
SARS Coronavirus 2 by RT PCR: NEGATIVE

## 2020-07-19 MED ORDER — HYDROCOD POLST-CPM POLST ER 10-8 MG/5ML PO SUER: 5.0000 mL | Freq: Two times a day (BID) | ORAL | 0 refills | Status: AC | PRN

## 2020-07-19 MED ORDER — DOXYCYCLINE HYCLATE 100 MG PO CAPS: 100.0000 mg | ORAL_CAPSULE | Freq: Two times a day (BID) | ORAL | 0 refills | Status: DC

## 2020-07-19 MED ORDER — LACTATED RINGERS IV BOLUS
500.0000 mL | Freq: Once | INTRAVENOUS | Status: AC
  Administered 2020-07-19: 500 mL via INTRAVENOUS

## 2020-07-19 NOTE — ED Triage Notes (Signed)
Pt c/o coughing, increasing shortness of breath, body aches. Recently on prednisone and amoxicillin for laryngitis.

## 2020-07-19 NOTE — ED Provider Notes (Signed)
[facility] EMERGENCY DEPARTMENT Provider Note  CSN: [id number] Arrival date & time: 07/19/20 1514    History Chief Complaint  Patient presents with   Shortness of Breath    Tamara Roberts is a 80 y.o. female with history of afib on eliquis reports she has been sick for the last 2 weeks with dry cough, sore throat, hoarse voice, body aches, low grade fever. She was seen by her PCP when she initially got sick, was given some medications but does not know what it was. She continued to worsen so she went back to her doctor about a week ago and was given Rx for prednisone and amoxil which she has been taking without improvement so she came to the ED today.    Past Medical History:  Diagnosis Date   Bursitis    Diverticulitis    DJD (degenerative joint disease), lumbar    Essential hypertension    GERD (gastroesophageal reflux disease)    History of cardiac catheterization    March 2018 - minimal atherosclerosis   Hyperlipidemia    Irritable bowel syndrome    Paroxysmal atrial fibrillation [facility])    Diagnosed April 2019   Spondylolisthesis at L4-L5 level    Stroke [facility])     Past Surgical History:  Procedure Laterality Date   ABDOMINAL HYSTERECTOMY     APPENDECTOMY     BILATERAL SALPINGOOPHORECTOMY     BREAST CYST ASPIRATION Left    CHOLECYSTECTOMY     HERNIA REPAIR     LEFT HEART CATH AND CORONARY ANGIOGRAPHY N/A 04/01/2016   Procedure: Left Heart Cath and Coronary Angiography;  Surgeon: Peter M Swaziland, MD;  Location: [facility] INVASIVE CV LAB;  Service: Cardiovascular;  Laterality: N/A;   LUMBAR SPINE SURGERY     SHOULDER SURGERY Right     Family History  Problem Relation Age of Onset   Cancer Mother    Diabetes Paternal Grandmother    Heart attack Maternal Grandfather    Heart disease Father    Arthritis Father        crippling   Alcoholism Brother    Drug abuse Brother    Congestive Heart Failure Daughter     Social History   Tobacco Use   Smoking status:  Never   Smokeless tobacco: Never  Vaping Use   Vaping Use: Never used  Substance Use Topics   Alcohol use: No   Drug use: No     Home Medications Prior to Admission medications   Medication Sig Start Date End Date Taking? Authorizing Provider  chlorpheniramine-HYDROcodone (TUSSIONEX PENNKINETIC ER) 10-8 MG/5ML SUER Take 5 mLs by mouth every 12 (twelve) hours as needed for cough. 07/19/20  Yes Pollyann Savoy, MD  doxycycline (VIBRAMYCIN) 100 MG capsule Take 1 capsule (100 mg total) by mouth 2 (two) times daily. 07/19/20  Yes Pollyann Savoy, MD  celecoxib (CELEBREX) 200 MG capsule Take 200 mg by mouth daily. 02/11/19   [provider]  cholecalciferol (VITAMIN D) 1000 units tablet Take 1,000 Units by mouth daily.    [provider]  ELIQUIS 5 MG TABS tablet TAKE (1) TABLET BY MOUTH TWICE DAILY. 03/18/20   Jonelle Sidle, MD  fluticasone (FLONASE) 50 MCG/ACT nasal spray Place 1 spray into both nostrils as needed.     [provider]  losartan (COZAAR) 50 MG tablet Take 50 mg by mouth daily.    [provider]  metoprolol succinate (TOPROL-XL) 25 MG 24 hr tablet Take 0.5 tablets (  12.5 mg total) by mouth daily. 02/13/19   Jonelle Sidle, MD  pregabalin (LYRICA) 25 MG capsule Take 25 mg by mouth 2 (two) times daily.    [provider]  rosuvastatin (CRESTOR) 5 MG tablet Take 5 mg by mouth daily.    [provider]  sertraline (ZOLOFT) 25 MG tablet Take 25 mg by mouth at bedtime. 02/06/19   [provider]     Allergies    Feldene [piroxicam] and Codeine   Review of Systems   Review of Systems A comprehensive review of systems was completed and negative except as noted in HPI.    Physical Exam BP (!) 148/77   Pulse 66   Temp 99.3 F (37.4 C)   Resp 18   Ht 5' (1.524 m)   Wt 75.1 kg   SpO2 96%   BMI 32.33 kg/m   Physical Exam Vitals and nursing note reviewed.  Constitutional:      Appearance: Normal  appearance.  HENT:     Head: Normocephalic and atraumatic.     Nose: Nose normal.     Mouth/Throat:     Mouth: Mucous membranes are moist.     Pharynx: No oropharyngeal exudate or posterior oropharyngeal erythema.  Eyes:     Extraocular Movements: Extraocular movements intact.     Conjunctiva/sclera: Conjunctivae normal.  Cardiovascular:     Rate and Rhythm: Normal rate.  Pulmonary:     Effort: Pulmonary effort is normal. No accessory muscle usage.     Breath sounds: Normal breath sounds. No stridor. No wheezing, rhonchi or rales.  Abdominal:     General: Abdomen is flat.     Palpations: Abdomen is soft.     Tenderness: There is no abdominal tenderness.  Musculoskeletal:        General: Tenderness (diffuse soft tissues) present. No swelling. Normal range of motion.     Cervical back: Neck supple.  Skin:    General: Skin is warm and dry.  Neurological:     General: No focal deficit present.     Mental Status: She is alert.  Psychiatric:        Mood and Affect: Mood normal.     ED Results / Procedures / Treatments   Labs (all labs ordered are listed, but only abnormal results are displayed) Labs Reviewed  COMPREHENSIVE METABOLIC PANEL - Abnormal; Notable for the following components:      Result Value   Potassium 3.4 (*)    Creatinine, Ser 1.36 (*)    Calcium 8.3 (*)    AST 14 (*)    GFR, Estimated 40 (*)    All other components within normal limits  BRAIN NATRIURETIC PEPTIDE - Abnormal; Notable for the following components:   B Natriuretic Peptide 194.0 (*)    All other components within normal limits  CBC WITH DIFFERENTIAL/PLATELET - Abnormal; Notable for the following components:   Monocytes Absolute 1.1 (*)    All other components within normal limits  RESP PANEL BY RT-PCR (FLU A&B, COVID) ARPGX2  CK  TROPONIN I (HIGH SENSITIVITY)    EKG EKG Interpretation  Date/Time:  Sunday July 19 2020 16:03:08 EDT Ventricular Rate:  63 PR Interval:  149 QRS  Duration: 92 QT Interval:  431 QTC Calculation: 442 R Axis:   18 Text Interpretation: Sinus rhythm Borderline ST depression, anterolateral leads No significant change since last tracing Confirmed by Susy Frizzle [phone]) on 07/19/2020 4:11:46 PM  Radiology DG Chest [facility] 1 View  Result  Date: 07/19/2020 CLINICAL DATA:  Shortness of breath cough and fever EXAM: PORTABLE CHEST 1 VIEW COMPARISON:  01/06/2018 FINDINGS: Cardiac size upper normal. Aortic atherosclerosis. Mild diffuse chronic bronchitic changes. Both lungs are clear. The visualized skeletal structures are unremarkable. IMPRESSION: No active disease. Electronically Signed   By: Jasmine Pang M.D.   On: 07/19/2020 15:59    Procedures Procedures  Medications Ordered in the ED Medications  lactated ringers bolus 500 mL (0 mLs Intravenous Stopped 07/19/20 1745)     MDM Rules/Calculators/A&P MDM Patient with likely URI symptoms, has not been tested for Covid. Continues to have cough and myalgias. Will check labs for cardiac etiology given her history. Covid swab sent. She is vaccinated/boosted.   ED Course  I have reviewed the triage vital signs and the nursing notes.  Pertinent labs & imaging results that were available during my care of the patient were reviewed by me and considered in my medical decision making (see chart for details).  Clinical Course as of 07/19/20 1831  Wynelle Link Jul 19, 2020  1604 CXR is clear [CS]  1617 CBC is normal.  [CS]  1639 BNP is mildly elevated.  [CS]  1650 Covid is neg. CMP is at baseline and CK is normal.  [CS]  1703 Trop is normal.  [CS]  1716 Patient remains hemodynamically stable. Vitals are normal. Will d/c with Tessalon, doxycycline and close PCP follow up.  [CS]    Clinical Course User Index [CS] Pollyann Savoy, MD    Final Clinical Impression(s) / ED Diagnoses Final diagnoses:  Bronchitis    Rx / DC Orders ED Discharge Orders          Ordered    chlorpheniramine-HYDROcodone  (TUSSIONEX PENNKINETIC ER) 10-8 MG/5ML SUER  Every 12 hours PRN        07/19/20 1720    doxycycline (VIBRAMYCIN) 100 MG capsule  2 times daily        07/19/20 1720             Pollyann Savoy, MD 07/19/20 [phone]

## 2020-09-14 DIAGNOSIS — Z23 Encounter for immunization: Secondary | ICD-10-CM | POA: Diagnosis not present

## 2020-09-14 DIAGNOSIS — I10 Essential (primary) hypertension: Secondary | ICD-10-CM | POA: Diagnosis not present

## 2020-09-14 DIAGNOSIS — Z299 Encounter for prophylactic measures, unspecified: Secondary | ICD-10-CM | POA: Diagnosis not present

## 2020-09-14 DIAGNOSIS — M25551 Pain in right hip: Secondary | ICD-10-CM | POA: Diagnosis not present

## 2020-10-20 DIAGNOSIS — E559 Vitamin D deficiency, unspecified: Secondary | ICD-10-CM | POA: Diagnosis not present

## 2020-10-20 DIAGNOSIS — Z6827 Body mass index (BMI) 27.0-27.9, adult: Secondary | ICD-10-CM | POA: Diagnosis not present

## 2020-10-20 DIAGNOSIS — E7800 Pure hypercholesterolemia, unspecified: Secondary | ICD-10-CM | POA: Diagnosis not present

## 2020-10-20 DIAGNOSIS — I10 Essential (primary) hypertension: Secondary | ICD-10-CM | POA: Diagnosis not present

## 2020-10-20 DIAGNOSIS — I25118 Atherosclerotic heart disease of native coronary artery with other forms of angina pectoris: Secondary | ICD-10-CM | POA: Diagnosis not present

## 2020-10-20 DIAGNOSIS — Z7189 Other specified counseling: Secondary | ICD-10-CM | POA: Diagnosis not present

## 2020-10-20 DIAGNOSIS — Z0000 Encounter for general adult medical examination without abnormal findings: Secondary | ICD-10-CM | POA: Diagnosis not present

## 2020-10-20 DIAGNOSIS — Z299 Encounter for prophylactic measures, unspecified: Secondary | ICD-10-CM | POA: Diagnosis not present

## 2020-10-20 DIAGNOSIS — Z1339 Encounter for screening examination for other mental health and behavioral disorders: Secondary | ICD-10-CM | POA: Diagnosis not present

## 2020-10-20 DIAGNOSIS — R5383 Other fatigue: Secondary | ICD-10-CM | POA: Diagnosis not present

## 2020-10-20 DIAGNOSIS — Z79899 Other long term (current) drug therapy: Secondary | ICD-10-CM | POA: Diagnosis not present

## 2020-10-20 DIAGNOSIS — Z1331 Encounter for screening for depression: Secondary | ICD-10-CM | POA: Diagnosis not present

## 2020-11-25 ENCOUNTER — Other Ambulatory Visit: Payer: Self-pay

## 2020-11-25 ENCOUNTER — Ambulatory Visit
Admission: RE | Admit: 2020-11-25 | Discharge: 2020-11-25 | Disposition: A | Discharge status: Home / Self Care | Payer: PPO | Source: Ambulatory Visit | Attending: Internal Medicine | Admitting: Internal Medicine

## 2020-11-25 DIAGNOSIS — Z1231 Encounter for screening mammogram for malignant neoplasm of breast: Secondary | ICD-10-CM

## 2020-11-25 DIAGNOSIS — I10 Essential (primary) hypertension: Secondary | ICD-10-CM | POA: Diagnosis not present

## 2020-11-25 DIAGNOSIS — D692 Other nonthrombocytopenic purpura: Secondary | ICD-10-CM | POA: Diagnosis not present

## 2020-11-25 DIAGNOSIS — I480 Paroxysmal atrial fibrillation: Secondary | ICD-10-CM | POA: Diagnosis not present

## 2020-11-25 DIAGNOSIS — I700 Atherosclerosis of aorta: Secondary | ICD-10-CM | POA: Diagnosis not present

## 2020-11-25 DIAGNOSIS — Z7901 Long term (current) use of anticoagulants: Secondary | ICD-10-CM | POA: Diagnosis not present

## 2020-12-23 ENCOUNTER — Emergency Department (HOSPITAL_COMMUNITY): Payer: PPO

## 2020-12-23 ENCOUNTER — Emergency Department (HOSPITAL_COMMUNITY)
Admission: EM | Admit: 2020-12-23 | Discharge: 2020-12-24 | Disposition: A | Discharge status: Home / Self Care | Payer: PPO | Attending: Emergency Medicine | Admitting: Emergency Medicine

## 2020-12-23 ENCOUNTER — Other Ambulatory Visit: Payer: Self-pay

## 2020-12-23 ENCOUNTER — Encounter (HOSPITAL_COMMUNITY): Payer: Self-pay

## 2020-12-23 DIAGNOSIS — R079 Chest pain, unspecified: Secondary | ICD-10-CM | POA: Diagnosis not present

## 2020-12-23 DIAGNOSIS — Z9049 Acquired absence of other specified parts of digestive tract: Secondary | ICD-10-CM | POA: Diagnosis not present

## 2020-12-23 DIAGNOSIS — R1013 Epigastric pain: Secondary | ICD-10-CM | POA: Insufficient documentation

## 2020-12-23 DIAGNOSIS — R0789 Other chest pain: Secondary | ICD-10-CM | POA: Insufficient documentation

## 2020-12-23 DIAGNOSIS — I701 Atherosclerosis of renal artery: Secondary | ICD-10-CM | POA: Diagnosis not present

## 2020-12-23 DIAGNOSIS — R1011 Right upper quadrant pain: Secondary | ICD-10-CM | POA: Diagnosis not present

## 2020-12-23 DIAGNOSIS — J9811 Atelectasis: Secondary | ICD-10-CM | POA: Diagnosis not present

## 2020-12-23 DIAGNOSIS — Z8719 Personal history of other diseases of the digestive system: Secondary | ICD-10-CM | POA: Diagnosis not present

## 2020-12-23 DIAGNOSIS — K219 Gastro-esophageal reflux disease without esophagitis: Secondary | ICD-10-CM | POA: Diagnosis not present

## 2020-12-23 DIAGNOSIS — Z79899 Other long term (current) drug therapy: Secondary | ICD-10-CM | POA: Diagnosis not present

## 2020-12-23 DIAGNOSIS — R42 Dizziness and giddiness: Secondary | ICD-10-CM | POA: Diagnosis not present

## 2020-12-23 DIAGNOSIS — I10 Essential (primary) hypertension: Secondary | ICD-10-CM | POA: Diagnosis not present

## 2020-12-23 DIAGNOSIS — I4891 Unspecified atrial fibrillation: Secondary | ICD-10-CM | POA: Insufficient documentation

## 2020-12-23 DIAGNOSIS — R0602 Shortness of breath: Secondary | ICD-10-CM | POA: Diagnosis not present

## 2020-12-23 DIAGNOSIS — Z7901 Long term (current) use of anticoagulants: Secondary | ICD-10-CM | POA: Insufficient documentation

## 2020-12-23 DIAGNOSIS — I774 Celiac artery compression syndrome: Secondary | ICD-10-CM | POA: Diagnosis not present

## 2020-12-23 DIAGNOSIS — I2510 Atherosclerotic heart disease of native coronary artery without angina pectoris: Secondary | ICD-10-CM | POA: Diagnosis not present

## 2020-12-23 DIAGNOSIS — M47814 Spondylosis without myelopathy or radiculopathy, thoracic region: Secondary | ICD-10-CM | POA: Diagnosis not present

## 2020-12-23 DIAGNOSIS — K5730 Diverticulosis of large intestine without perforation or abscess without bleeding: Secondary | ICD-10-CM | POA: Diagnosis not present

## 2020-12-23 LAB — COMPREHENSIVE METABOLIC PANEL
ALT: 15 U/L (ref 0–44)
AST: 19 U/L (ref 15–41)
Albumin: 3.8 g/dL (ref 3.5–5.0)
Alkaline Phosphatase: 78 U/L (ref 38–126)
Anion gap: 8 (ref 5–15)
BUN: 18 mg/dL (ref 8–23)
CO2: 25 mmol/L (ref 22–32)
Calcium: 8.6 mg/dL — ABNORMAL LOW (ref 8.9–10.3)
Chloride: 107 mmol/L (ref 98–111)
Creatinine, Ser: 1.23 mg/dL — ABNORMAL HIGH (ref 0.44–1.00)
GFR, Estimated: 45 mL/min — ABNORMAL LOW (ref >60)
Glucose, Bld: 101 mg/dL — ABNORMAL HIGH (ref 70–99)
Potassium: 3.5 mmol/L (ref 3.5–5.1)
Sodium: 140 mmol/L (ref 135–145)
Total Bilirubin: 0.3 mg/dL (ref 0.3–1.2)
Total Protein: 7.2 g/dL (ref 6.5–8.1)

## 2020-12-23 LAB — CBC
HCT: 40.1 % (ref 36.0–46.0)
Hemoglobin: 13.1 g/dL (ref 12.0–15.0)
MCH: 30.7 pg (ref 26.0–34.0)
MCHC: 32.7 g/dL (ref 30.0–36.0)
MCV: 93.9 fL (ref 80.0–100.0)
Platelets: 237 10*3/uL (ref 150–400)
RBC: 4.27 MIL/uL (ref 3.87–5.11)
RDW: 13.3 % (ref 11.5–15.5)
WBC: 6.5 10*3/uL (ref 4.0–10.5)
nRBC: 0 % (ref 0.0–0.2)

## 2020-12-23 LAB — CBG MONITORING, ED: Glucose-Capillary: 103 mg/dL — ABNORMAL HIGH (ref 70–99)

## 2020-12-23 LAB — TROPONIN I (HIGH SENSITIVITY): Troponin I (High Sensitivity): 12 ng/L (ref <18)

## 2020-12-23 LAB — LIPASE, BLOOD: Lipase: 36 U/L (ref 11–51)

## 2020-12-23 MED ORDER — IOHEXOL 350 MG/ML SOLN
100.0000 mL | Freq: Once | INTRAVENOUS | Status: AC | PRN
  Administered 2020-12-23: 23:00:00 100 mL via INTRAVENOUS

## 2020-12-23 MED ORDER — FENTANYL CITRATE PF 50 MCG/ML IJ SOSY
50.0000 ug | PREFILLED_SYRINGE | Freq: Once | INTRAMUSCULAR | Status: AC
  Administered 2020-12-23: 22:00:00 50 ug via INTRAVENOUS
  Filled 2020-12-23: qty 1

## 2020-12-23 MED ORDER — ASPIRIN 81 MG PO CHEW
324.0000 mg | CHEWABLE_TABLET | Freq: Once | ORAL | Status: AC
  Administered 2020-12-23: 22:00:00 324 mg via ORAL
  Filled 2020-12-23: qty 4

## 2020-12-23 MED ORDER — ONDANSETRON HCL 4 MG/2ML IJ SOLN
4.0000 mg | Freq: Once | INTRAMUSCULAR | Status: AC
  Administered 2020-12-23: 22:00:00 4 mg via INTRAVENOUS
  Filled 2020-12-23: qty 2

## 2020-12-23 MED ORDER — FENTANYL CITRATE PF 50 MCG/ML IJ SOSY
50.0000 ug | PREFILLED_SYRINGE | Freq: Once | INTRAMUSCULAR | Status: AC
  Administered 2020-12-23: 50 ug via INTRAVENOUS
  Filled 2020-12-23: qty 1

## 2020-12-23 MED ORDER — PANTOPRAZOLE SODIUM 40 MG IV SOLR
40.0000 mg | Freq: Once | INTRAVENOUS | Status: AC
  Administered 2020-12-23: 40 mg via INTRAVENOUS
  Filled 2020-12-23: qty 40

## 2020-12-23 NOTE — ED Provider Notes (Signed)
°  Provider Note MRN:  [id number]  Arrival date & time: 12/24/20    ED Course and Medical Decision Making  Assumed care from Dr. Lynelle Doctor at shift change.  Chest pain with some epigastric tenderness, dissection study is normal.  Favoring noncardiac, awaiting second troponin.  Second troponin is negative, appropriate for discharge.  Procedures  Final Clinical Impressions(s) / ED Diagnoses     ICD-10-CM   1. Chest pain, unspecified type  R07.9       ED Discharge Orders          Ordered    sucralfate (CARAFATE) 1 g tablet  4 times daily PRN        12/24/20 0120              Discharge Instructions      You were evaluated in the Emergency Department and after careful evaluation, we did not find any emergent condition requiring admission or further testing in the hospital.  Your exam/testing today was overall reassuring.  Your CT scan was normal and your labs are not showing any evidence of a heart attack.  Recommend use of the Carafate as needed for possible acid reflux related pain and follow-up closely with your regular doctor to discuss your symptoms.  Please return to the Emergency Department if you experience any worsening of your condition.  Thank you for allowing [place] to be a part of your care.       Elmer Sow. Pilar Plate, MD [facility] Emergency Medicine [facility] [email or web address]    Sabas Sous, MD 12/24/20 [phone]

## 2020-12-23 NOTE — ED Triage Notes (Signed)
Pt arrived via POV c/o sudden onset of chest pain and SOB. Pt denies N/V. Pt non-radiating.

## 2020-12-23 NOTE — ED Provider Notes (Signed)
[facility] EMERGENCY DEPARTMENT Provider Note   CSN: [id number] Arrival date & time: 12/23/20  2109     History Chief Complaint  Patient presents with   Chest Pain    Tamara Roberts is a 80 y.o. female.   Chest Pain  Patient presented with sudden onset of severe chest and abdominal pain.  Patient states his symptoms started shortly prior to arrival.  She had sudden onset of pain going across her chest.  Did not radiate into her back or shoulder.  She felt short of breath.  Patient felt lightheaded as she is going to pass out.  She denies any nausea vomiting.  No diaphoresis.  Patient does not have any history of coronary artery disease.  No history of pulmonary embolism.  She has had prior multiple abdominal surgeries in the past.  Symptoms were severe and she called EMS.  Past Medical History:  Diagnosis Date   Bursitis    Diverticulitis    DJD (degenerative joint disease), lumbar    Essential hypertension    GERD (gastroesophageal reflux disease)    History of cardiac catheterization    March 2018 - minimal atherosclerosis   Hyperlipidemia    Irritable bowel syndrome    Paroxysmal atrial fibrillation (HCC)    Diagnosed April 2019   Spondylolisthesis at L4-L5 level    Stroke [facility])     Patient Active Problem List   Diagnosis Date Noted   Diverticulitis 01/06/2018   AF (paroxysmal atrial fibrillation) (HCC) 01/06/2018   Atrial fibrillation with rapid ventricular response (HCC) 04/14/2017   GERD (gastroesophageal reflux disease) 04/14/2017   S/P lumbar spinal fusion 08/25/2016   Chest pain 04/02/2016   Hypokalemia 04/02/2016   Renal insufficiency 04/02/2016   History of stroke 04/01/2016   Dyslipidemia 09/26/2008   Essential hypertension 09/26/2008   DJD (degenerative joint disease), lumbar 09/26/2008    Past Surgical History:  Procedure Laterality Date   ABDOMINAL HYSTERECTOMY     APPENDECTOMY     BILATERAL SALPINGOOPHORECTOMY     BREAST CYST ASPIRATION  Left    CHOLECYSTECTOMY     HERNIA REPAIR     LEFT HEART CATH AND CORONARY ANGIOGRAPHY N/A 04/01/2016   Procedure: Left Heart Cath and Coronary Angiography;  Surgeon: Peter M Swaziland, MD;  Location: [facility] INVASIVE CV LAB;  Service: Cardiovascular;  Laterality: N/A;   LUMBAR SPINE SURGERY     SHOULDER SURGERY Right      OB History     Gravida  4   Para      Term      Preterm      AB      Living  4      SAB      IAB      Ectopic      Multiple      Live Births              Family History  Problem Relation Age of Onset   Cancer Mother    Heart disease Father    Arthritis Father        crippling   Congestive Heart Failure Daughter    Heart attack Maternal Grandfather    Diabetes Paternal Grandmother    Alcoholism Brother    Drug abuse Brother    Breast cancer Neg Hx     Social History   Tobacco Use   Smoking status: Never   Smokeless tobacco: Never  Vaping Use   Vaping Use: Never used  Substance  Use Topics   Alcohol use: No   Drug use: No    Home Medications Prior to Admission medications   Medication Sig Start Date End Date Taking? Authorizing Provider  celecoxib (CELEBREX) 200 MG capsule Take 200 mg by mouth daily. 02/11/19   [provider]  chlorpheniramine-HYDROcodone (TUSSIONEX PENNKINETIC ER) 10-8 MG/5ML SUER Take 5 mLs by mouth every 12 (twelve) hours as needed for cough. 07/19/20   Truddie Hidden, MD  cholecalciferol (VITAMIN D) 1000 units tablet Take 1,000 Units by mouth daily.    [provider]  doxycycline (VIBRAMYCIN) 100 MG capsule Take 1 capsule (100 mg total) by mouth 2 (two) times daily. 07/19/20   Truddie Hidden, MD  ELIQUIS 5 MG TABS tablet TAKE (1) TABLET BY MOUTH TWICE DAILY. 03/18/20   Satira Sark, MD  fluticasone (FLONASE) 50 MCG/ACT nasal spray Place 1 spray into both nostrils as needed.     [provider]  losartan (COZAAR) 50 MG tablet Take 50 mg by mouth daily.    [provider]  metoprolol succinate (TOPROL-XL) 25 MG 24 hr tablet Take 0.5 tablets (12.5 mg total) by mouth daily. 02/13/19   Satira Sark, MD  pregabalin (LYRICA) 25 MG capsule Take 25 mg by mouth 2 (two) times daily.    [provider]  rosuvastatin (CRESTOR) 5 MG tablet Take 5 mg by mouth daily.    [provider]  sertraline (ZOLOFT) 25 MG tablet Take 25 mg by mouth at bedtime. 02/06/19   [provider]    Allergies    Feldene [piroxicam] and Codeine  Review of Systems   Review of Systems  Cardiovascular:  Positive for chest pain.  All other systems reviewed and are negative.  Physical Exam Updated Vital Signs BP (!) 189/90    Pulse 71    Temp 98.3 F (36.8 C) (Oral)    Resp 12    Ht 1.524 m (5')    Wt 62.6 kg    SpO2 94%    BMI 26.95 kg/m   Physical Exam Vitals and nursing note reviewed.  Constitutional:      Appearance: She is well-developed. She is ill-appearing.  HENT:     Head: Normocephalic and atraumatic.     Right Ear: External ear normal.     Left Ear: External ear normal.  Eyes:     General: No scleral icterus.       Right eye: No discharge.        Left eye: No discharge.     Conjunctiva/sclera: Conjunctivae normal.  Neck:     Trachea: No tracheal deviation.  Cardiovascular:     Rate and Rhythm: Normal rate and regular rhythm.  Pulmonary:     Effort: Pulmonary effort is normal. No respiratory distress.     Breath sounds: Normal breath sounds. No stridor. No wheezing or rales.  Abdominal:     General: Bowel sounds are normal. There is no distension.     Palpations: Abdomen is soft.     Tenderness: There is abdominal tenderness in the right upper quadrant and epigastric area. There is no guarding or rebound.  Musculoskeletal:        General: No tenderness or deformity.     Cervical back: Neck supple.  Skin:    General: Skin is warm and dry.     Findings: No rash.  Neurological:     General: No focal deficit present.     Mental  Status: She  is alert.     Cranial Nerves: No cranial nerve deficit (no facial droop, extraocular movements intact, no slurred speech).     Sensory: No sensory deficit.     Motor: No abnormal muscle tone or seizure activity.     Coordination: Coordination normal.  Psychiatric:        Mood and Affect: Mood normal.    ED Results / Procedures / Treatments   Labs (all labs ordered are listed, but only abnormal results are displayed) Labs Reviewed  CBC  COMPREHENSIVE METABOLIC PANEL  LIPASE, BLOOD  CBG MONITORING, ED  TROPONIN I (HIGH SENSITIVITY)    EKG None  Radiology No results found.  Procedures Procedures   Medications Ordered in ED Medications  aspirin chewable tablet 324 mg (has no administration in time range)  fentaNYL (SUBLIMAZE) injection 50 mcg (has no administration in time range)  ondansetron (ZOFRAN) injection 4 mg (has no administration in time range)    ED Course  I have reviewed the triage vital signs and the nursing notes.  Pertinent labs & imaging results that were available during my care of the patient were reviewed by me and considered in my medical decision making (see chart for details).  Clinical Course as of 12/27/20 1000  Wed Dec 23, 2020  2330 CT scan without acute findings [JK]    Clinical Course User Index [JK] Dorie Rank, MD   MDM Rules/Calculators/A&P                         Patient presented to the ED for evaluation of acute chest pain.  Presentation concerning for the possibility of acute coronary syndrome, pulmonary embolism and aortic dissection.  Patient's initial x-ray did not show any acute findings.  Patient's initial troponin was normal.  With her upper abdominal discomfort and chest pain CT angiogram chest abdomen pelvis was performed.  Fortunately no acute findings.  Delta troponin was pending.  Plan was for repeat troponin and reassessment.  Care turned over to Dr. Sedonia Small.    Final Clinical Impression(s) / ED Diagnoses Chest  pain   Dorie Rank, MD 12/27/20 1001

## 2020-12-24 LAB — TROPONIN I (HIGH SENSITIVITY): Troponin I (High Sensitivity): 13 ng/L (ref <18)

## 2020-12-24 MED ORDER — SUCRALFATE 1 G PO TABS: 1.0000 g | ORAL_TABLET | Freq: Four times a day (QID) | ORAL | 0 refills | Status: AC | PRN

## 2020-12-24 NOTE — Discharge Instructions (Addendum)
You were evaluated in the Emergency Department and after careful evaluation, we did not find any emergent condition requiring admission or further testing in the hospital.  Your exam/testing today was overall reassuring.  Your CT scan was normal and your labs are not showing any evidence of a heart attack.  Recommend use of the Carafate as needed for possible acid reflux related pain and follow-up closely with your regular doctor to discuss your symptoms.  Please return to the Emergency Department if you experience any worsening of your condition.  Thank you for allowing [place] to be a part of your care.

## 2021-01-06 DIAGNOSIS — Z299 Encounter for prophylactic measures, unspecified: Secondary | ICD-10-CM | POA: Diagnosis not present

## 2021-01-06 DIAGNOSIS — I25118 Atherosclerotic heart disease of native coronary artery with other forms of angina pectoris: Secondary | ICD-10-CM | POA: Diagnosis not present

## 2021-01-06 DIAGNOSIS — I10 Essential (primary) hypertension: Secondary | ICD-10-CM | POA: Diagnosis not present

## 2021-01-06 DIAGNOSIS — I480 Paroxysmal atrial fibrillation: Secondary | ICD-10-CM | POA: Diagnosis not present

## 2021-01-20 DIAGNOSIS — M19011 Primary osteoarthritis, right shoulder: Secondary | ICD-10-CM | POA: Diagnosis not present

## 2021-01-20 DIAGNOSIS — Z299 Encounter for prophylactic measures, unspecified: Secondary | ICD-10-CM | POA: Diagnosis not present

## 2021-01-20 DIAGNOSIS — M19012 Primary osteoarthritis, left shoulder: Secondary | ICD-10-CM | POA: Diagnosis not present

## 2021-01-20 DIAGNOSIS — I10 Essential (primary) hypertension: Secondary | ICD-10-CM | POA: Diagnosis not present

## 2021-01-20 DIAGNOSIS — K209 Esophagitis, unspecified without bleeding: Secondary | ICD-10-CM | POA: Diagnosis not present

## 2021-01-21 DIAGNOSIS — H25813 Combined forms of age-related cataract, bilateral: Secondary | ICD-10-CM | POA: Diagnosis not present

## 2021-01-27 DIAGNOSIS — H2511 Age-related nuclear cataract, right eye: Secondary | ICD-10-CM | POA: Diagnosis not present

## 2021-01-27 DIAGNOSIS — H25812 Combined forms of age-related cataract, left eye: Secondary | ICD-10-CM | POA: Diagnosis not present

## 2021-01-27 DIAGNOSIS — H25011 Cortical age-related cataract, right eye: Secondary | ICD-10-CM | POA: Diagnosis not present

## 2021-02-03 DIAGNOSIS — H2512 Age-related nuclear cataract, left eye: Secondary | ICD-10-CM | POA: Diagnosis not present

## 2021-02-03 DIAGNOSIS — H25012 Cortical age-related cataract, left eye: Secondary | ICD-10-CM | POA: Diagnosis not present

## 2021-04-20 DIAGNOSIS — Z789 Other specified health status: Secondary | ICD-10-CM | POA: Diagnosis not present

## 2021-04-20 DIAGNOSIS — M25511 Pain in right shoulder: Secondary | ICD-10-CM | POA: Diagnosis not present

## 2021-04-20 DIAGNOSIS — Z299 Encounter for prophylactic measures, unspecified: Secondary | ICD-10-CM | POA: Diagnosis not present

## 2021-04-20 DIAGNOSIS — I10 Essential (primary) hypertension: Secondary | ICD-10-CM | POA: Diagnosis not present

## 2021-06-10 DIAGNOSIS — Z961 Presence of intraocular lens: Secondary | ICD-10-CM | POA: Diagnosis not present

## 2021-09-15 DIAGNOSIS — I10 Essential (primary) hypertension: Secondary | ICD-10-CM | POA: Diagnosis not present

## 2021-09-15 DIAGNOSIS — Z79899 Other long term (current) drug therapy: Secondary | ICD-10-CM | POA: Diagnosis not present

## 2021-09-15 DIAGNOSIS — M25511 Pain in right shoulder: Secondary | ICD-10-CM | POA: Diagnosis not present

## 2021-09-15 DIAGNOSIS — L255 Unspecified contact dermatitis due to plants, except food: Secondary | ICD-10-CM | POA: Diagnosis not present

## 2021-09-15 DIAGNOSIS — Z299 Encounter for prophylactic measures, unspecified: Secondary | ICD-10-CM | POA: Diagnosis not present

## 2021-10-25 DIAGNOSIS — Z23 Encounter for immunization: Secondary | ICD-10-CM | POA: Diagnosis not present

## 2021-10-25 DIAGNOSIS — Z79899 Other long term (current) drug therapy: Secondary | ICD-10-CM | POA: Diagnosis not present

## 2021-10-25 DIAGNOSIS — E559 Vitamin D deficiency, unspecified: Secondary | ICD-10-CM | POA: Diagnosis not present

## 2021-10-25 DIAGNOSIS — Z0000 Encounter for general adult medical examination without abnormal findings: Secondary | ICD-10-CM | POA: Diagnosis not present

## 2021-10-25 DIAGNOSIS — I10 Essential (primary) hypertension: Secondary | ICD-10-CM | POA: Diagnosis not present

## 2021-10-25 DIAGNOSIS — Z299 Encounter for prophylactic measures, unspecified: Secondary | ICD-10-CM | POA: Diagnosis not present

## 2021-10-25 DIAGNOSIS — Z7189 Other specified counseling: Secondary | ICD-10-CM | POA: Diagnosis not present

## 2021-10-25 DIAGNOSIS — R5383 Other fatigue: Secondary | ICD-10-CM | POA: Diagnosis not present

## 2021-11-03 DIAGNOSIS — R413 Other amnesia: Secondary | ICD-10-CM | POA: Diagnosis not present

## 2021-11-24 DIAGNOSIS — I10 Essential (primary) hypertension: Secondary | ICD-10-CM | POA: Diagnosis not present

## 2021-11-24 DIAGNOSIS — Z299 Encounter for prophylactic measures, unspecified: Secondary | ICD-10-CM | POA: Diagnosis not present

## 2021-11-24 DIAGNOSIS — M25551 Pain in right hip: Secondary | ICD-10-CM | POA: Diagnosis not present

## 2021-12-06 DIAGNOSIS — M1611 Unilateral primary osteoarthritis, right hip: Secondary | ICD-10-CM | POA: Diagnosis not present

## 2021-12-06 DIAGNOSIS — M25551 Pain in right hip: Secondary | ICD-10-CM | POA: Diagnosis not present

## 2021-12-06 DIAGNOSIS — M7061 Trochanteric bursitis, right hip: Secondary | ICD-10-CM | POA: Diagnosis not present

## 2021-12-06 DIAGNOSIS — M5431 Sciatica, right side: Secondary | ICD-10-CM | POA: Diagnosis not present

## 2021-12-15 DIAGNOSIS — M810 Age-related osteoporosis without current pathological fracture: Secondary | ICD-10-CM | POA: Diagnosis not present

## 2021-12-15 DIAGNOSIS — Z0000 Encounter for general adult medical examination without abnormal findings: Secondary | ICD-10-CM | POA: Diagnosis not present

## 2021-12-15 DIAGNOSIS — Z299 Encounter for prophylactic measures, unspecified: Secondary | ICD-10-CM | POA: Diagnosis not present

## 2021-12-15 DIAGNOSIS — I10 Essential (primary) hypertension: Secondary | ICD-10-CM | POA: Diagnosis not present

## 2021-12-15 DIAGNOSIS — M7061 Trochanteric bursitis, right hip: Secondary | ICD-10-CM | POA: Diagnosis not present

## 2021-12-23 DIAGNOSIS — M5431 Sciatica, right side: Secondary | ICD-10-CM | POA: Diagnosis not present

## 2021-12-23 DIAGNOSIS — M1611 Unilateral primary osteoarthritis, right hip: Secondary | ICD-10-CM | POA: Diagnosis not present

## 2021-12-23 DIAGNOSIS — M961 Postlaminectomy syndrome, not elsewhere classified: Secondary | ICD-10-CM | POA: Diagnosis not present

## 2021-12-28 DIAGNOSIS — M5416 Radiculopathy, lumbar region: Secondary | ICD-10-CM | POA: Diagnosis not present

## 2022-01-11 DIAGNOSIS — M961 Postlaminectomy syndrome, not elsewhere classified: Secondary | ICD-10-CM | POA: Diagnosis not present

## 2022-01-11 DIAGNOSIS — M5416 Radiculopathy, lumbar region: Secondary | ICD-10-CM | POA: Diagnosis not present

## 2022-01-11 DIAGNOSIS — M1611 Unilateral primary osteoarthritis, right hip: Secondary | ICD-10-CM | POA: Diagnosis not present

## 2022-01-11 DIAGNOSIS — M5431 Sciatica, right side: Secondary | ICD-10-CM | POA: Diagnosis not present

## 2022-01-17 DIAGNOSIS — I480 Paroxysmal atrial fibrillation: Secondary | ICD-10-CM | POA: Diagnosis not present

## 2022-01-17 DIAGNOSIS — M25511 Pain in right shoulder: Secondary | ICD-10-CM | POA: Diagnosis not present

## 2022-01-17 DIAGNOSIS — Z299 Encounter for prophylactic measures, unspecified: Secondary | ICD-10-CM | POA: Diagnosis not present

## 2022-01-17 DIAGNOSIS — I739 Peripheral vascular disease, unspecified: Secondary | ICD-10-CM | POA: Diagnosis not present

## 2022-01-17 DIAGNOSIS — I10 Essential (primary) hypertension: Secondary | ICD-10-CM | POA: Diagnosis not present

## 2022-02-02 DIAGNOSIS — M25551 Pain in right hip: Secondary | ICD-10-CM | POA: Diagnosis not present

## 2022-03-14 DIAGNOSIS — Z299 Encounter for prophylactic measures, unspecified: Secondary | ICD-10-CM | POA: Diagnosis not present

## 2022-03-14 DIAGNOSIS — N183 Chronic kidney disease, stage 3 unspecified: Secondary | ICD-10-CM | POA: Diagnosis not present

## 2022-03-14 DIAGNOSIS — M25511 Pain in right shoulder: Secondary | ICD-10-CM | POA: Diagnosis not present

## 2022-03-14 DIAGNOSIS — I10 Essential (primary) hypertension: Secondary | ICD-10-CM | POA: Diagnosis not present

## 2022-03-14 DIAGNOSIS — Z0000 Encounter for general adult medical examination without abnormal findings: Secondary | ICD-10-CM | POA: Diagnosis not present

## 2022-04-20 DIAGNOSIS — M1611 Unilateral primary osteoarthritis, right hip: Secondary | ICD-10-CM | POA: Diagnosis not present

## 2022-04-20 DIAGNOSIS — Z5181 Encounter for therapeutic drug level monitoring: Secondary | ICD-10-CM | POA: Diagnosis not present

## 2022-04-20 DIAGNOSIS — Z79899 Other long term (current) drug therapy: Secondary | ICD-10-CM | POA: Diagnosis not present

## 2022-04-20 DIAGNOSIS — M961 Postlaminectomy syndrome, not elsewhere classified: Secondary | ICD-10-CM | POA: Diagnosis not present

## 2022-04-20 DIAGNOSIS — M5416 Radiculopathy, lumbar region: Secondary | ICD-10-CM | POA: Diagnosis not present

## 2022-05-03 DIAGNOSIS — M5416 Radiculopathy, lumbar region: Secondary | ICD-10-CM | POA: Diagnosis not present

## 2022-06-08 DIAGNOSIS — Z961 Presence of intraocular lens: Secondary | ICD-10-CM | POA: Diagnosis not present

## 2022-06-13 DIAGNOSIS — L255 Unspecified contact dermatitis due to plants, except food: Secondary | ICD-10-CM | POA: Diagnosis not present

## 2022-06-13 DIAGNOSIS — Z299 Encounter for prophylactic measures, unspecified: Secondary | ICD-10-CM | POA: Diagnosis not present

## 2022-06-13 DIAGNOSIS — M25511 Pain in right shoulder: Secondary | ICD-10-CM | POA: Diagnosis not present

## 2022-06-30 DIAGNOSIS — W19XXXA Unspecified fall, initial encounter: Secondary | ICD-10-CM | POA: Diagnosis not present

## 2022-06-30 DIAGNOSIS — B360 Pityriasis versicolor: Secondary | ICD-10-CM | POA: Diagnosis not present

## 2022-06-30 DIAGNOSIS — I10 Essential (primary) hypertension: Secondary | ICD-10-CM | POA: Diagnosis not present

## 2022-06-30 DIAGNOSIS — Z299 Encounter for prophylactic measures, unspecified: Secondary | ICD-10-CM | POA: Diagnosis not present

## 2022-11-01 DIAGNOSIS — Z0000 Encounter for general adult medical examination without abnormal findings: Secondary | ICD-10-CM | POA: Diagnosis not present

## 2022-11-01 DIAGNOSIS — E559 Vitamin D deficiency, unspecified: Secondary | ICD-10-CM | POA: Diagnosis not present

## 2022-11-01 DIAGNOSIS — I10 Essential (primary) hypertension: Secondary | ICD-10-CM | POA: Diagnosis not present

## 2022-11-01 DIAGNOSIS — Z79899 Other long term (current) drug therapy: Secondary | ICD-10-CM | POA: Diagnosis not present

## 2022-11-01 DIAGNOSIS — R52 Pain, unspecified: Secondary | ICD-10-CM | POA: Diagnosis not present

## 2022-11-01 DIAGNOSIS — R5383 Other fatigue: Secondary | ICD-10-CM | POA: Diagnosis not present

## 2022-11-01 DIAGNOSIS — Z7189 Other specified counseling: Secondary | ICD-10-CM | POA: Diagnosis not present

## 2022-11-01 DIAGNOSIS — E7800 Pure hypercholesterolemia, unspecified: Secondary | ICD-10-CM | POA: Diagnosis not present

## 2022-11-01 DIAGNOSIS — D6869 Other thrombophilia: Secondary | ICD-10-CM | POA: Diagnosis not present

## 2022-11-01 DIAGNOSIS — Z299 Encounter for prophylactic measures, unspecified: Secondary | ICD-10-CM | POA: Diagnosis not present

## 2022-11-01 DIAGNOSIS — Z23 Encounter for immunization: Secondary | ICD-10-CM | POA: Diagnosis not present

## 2022-11-01 DIAGNOSIS — I480 Paroxysmal atrial fibrillation: Secondary | ICD-10-CM | POA: Diagnosis not present

## 2022-11-02 DIAGNOSIS — M1611 Unilateral primary osteoarthritis, right hip: Secondary | ICD-10-CM | POA: Diagnosis not present

## 2022-11-02 DIAGNOSIS — M7061 Trochanteric bursitis, right hip: Secondary | ICD-10-CM | POA: Diagnosis not present

## 2022-11-02 DIAGNOSIS — M5416 Radiculopathy, lumbar region: Secondary | ICD-10-CM | POA: Diagnosis not present

## 2022-11-02 DIAGNOSIS — M961 Postlaminectomy syndrome, not elsewhere classified: Secondary | ICD-10-CM | POA: Diagnosis not present

## 2022-11-09 DIAGNOSIS — Z299 Encounter for prophylactic measures, unspecified: Secondary | ICD-10-CM | POA: Diagnosis not present

## 2022-11-09 DIAGNOSIS — I10 Essential (primary) hypertension: Secondary | ICD-10-CM | POA: Diagnosis not present

## 2022-11-09 DIAGNOSIS — J069 Acute upper respiratory infection, unspecified: Secondary | ICD-10-CM | POA: Diagnosis not present

## 2022-11-09 DIAGNOSIS — N183 Chronic kidney disease, stage 3 unspecified: Secondary | ICD-10-CM | POA: Diagnosis not present

## 2022-11-17 DIAGNOSIS — M5416 Radiculopathy, lumbar region: Secondary | ICD-10-CM | POA: Diagnosis not present

## 2023-03-06 DIAGNOSIS — Z0000 Encounter for general adult medical examination without abnormal findings: Secondary | ICD-10-CM | POA: Diagnosis not present

## 2023-03-06 DIAGNOSIS — E559 Vitamin D deficiency, unspecified: Secondary | ICD-10-CM | POA: Diagnosis not present

## 2023-03-06 DIAGNOSIS — I10 Essential (primary) hypertension: Secondary | ICD-10-CM | POA: Diagnosis not present

## 2023-03-06 DIAGNOSIS — Z299 Encounter for prophylactic measures, unspecified: Secondary | ICD-10-CM | POA: Diagnosis not present

## 2023-03-06 DIAGNOSIS — N1832 Chronic kidney disease, stage 3b: Secondary | ICD-10-CM | POA: Diagnosis not present

## 2023-03-07 DIAGNOSIS — M961 Postlaminectomy syndrome, not elsewhere classified: Secondary | ICD-10-CM | POA: Diagnosis not present

## 2023-03-07 DIAGNOSIS — M5416 Radiculopathy, lumbar region: Secondary | ICD-10-CM | POA: Diagnosis not present

## 2023-03-29 ENCOUNTER — Other Ambulatory Visit: Payer: Self-pay | Admitting: Internal Medicine

## 2023-03-29 DIAGNOSIS — Z1231 Encounter for screening mammogram for malignant neoplasm of breast: Secondary | ICD-10-CM

## 2023-04-04 ENCOUNTER — Inpatient Hospital Stay: Admission: RE | Admit: 2023-04-04 | Source: Ambulatory Visit

## 2023-04-30 ENCOUNTER — Emergency Department (HOSPITAL_COMMUNITY)

## 2023-04-30 ENCOUNTER — Emergency Department (HOSPITAL_COMMUNITY)
Admission: EM | Admit: 2023-04-30 | Discharge: 2023-04-30 | Disposition: A | Discharge status: Home / Self Care | Attending: Emergency Medicine | Admitting: Emergency Medicine

## 2023-04-30 ENCOUNTER — Other Ambulatory Visit: Payer: Self-pay

## 2023-04-30 ENCOUNTER — Encounter (HOSPITAL_COMMUNITY): Payer: Self-pay | Admitting: *Deleted

## 2023-04-30 DIAGNOSIS — Z7901 Long term (current) use of anticoagulants: Secondary | ICD-10-CM | POA: Diagnosis not present

## 2023-04-30 DIAGNOSIS — R059 Cough, unspecified: Secondary | ICD-10-CM | POA: Diagnosis present

## 2023-04-30 DIAGNOSIS — R0602 Shortness of breath: Secondary | ICD-10-CM | POA: Diagnosis not present

## 2023-04-30 DIAGNOSIS — J209 Acute bronchitis, unspecified: Secondary | ICD-10-CM | POA: Diagnosis not present

## 2023-04-30 DIAGNOSIS — I517 Cardiomegaly: Secondary | ICD-10-CM | POA: Diagnosis not present

## 2023-04-30 DIAGNOSIS — I700 Atherosclerosis of aorta: Secondary | ICD-10-CM | POA: Diagnosis not present

## 2023-04-30 DIAGNOSIS — R0989 Other specified symptoms and signs involving the circulatory and respiratory systems: Secondary | ICD-10-CM | POA: Diagnosis not present

## 2023-04-30 LAB — CBC WITH DIFFERENTIAL/PLATELET
Abs Immature Granulocytes: 0.01 10*3/uL (ref 0.00–0.07)
Basophils Absolute: 0 10*3/uL (ref 0.0–0.1)
Basophils Relative: 1 %
Eosinophils Absolute: 0 10*3/uL (ref 0.0–0.5)
Eosinophils Relative: 0 %
HCT: 39.2 % (ref 36.0–46.0)
Hemoglobin: 13 g/dL (ref 12.0–15.0)
Immature Granulocytes: 0 %
Lymphocytes Relative: 18 %
Lymphs Abs: 0.8 10*3/uL (ref 0.7–4.0)
MCH: 30.1 pg (ref 26.0–34.0)
MCHC: 33.2 g/dL (ref 30.0–36.0)
MCV: 90.7 fL (ref 80.0–100.0)
Monocytes Absolute: 0.4 10*3/uL (ref 0.1–1.0)
Monocytes Relative: 9 %
Neutro Abs: 3.3 10*3/uL (ref 1.7–7.7)
Neutrophils Relative %: 72 %
Platelets: 206 10*3/uL (ref 150–400)
RBC: 4.32 MIL/uL (ref 3.87–5.11)
RDW: 13.1 % (ref 11.5–15.5)
WBC: 4.6 10*3/uL (ref 4.0–10.5)
nRBC: 0 % (ref 0.0–0.2)

## 2023-04-30 LAB — RESP PANEL BY RT-PCR (RSV, FLU A&B, COVID)  RVPGX2
Influenza A by PCR: NEGATIVE
Influenza B by PCR: NEGATIVE
Resp Syncytial Virus by PCR: NEGATIVE
SARS Coronavirus 2 by RT PCR: NEGATIVE

## 2023-04-30 LAB — BRAIN NATRIURETIC PEPTIDE: B Natriuretic Peptide: 235 pg/mL — ABNORMAL HIGH (ref 0.0–100.0)

## 2023-04-30 LAB — COMPREHENSIVE METABOLIC PANEL WITH GFR
ALT: 24 U/L (ref 0–44)
AST: 29 U/L (ref 15–41)
Albumin: 3.8 g/dL (ref 3.5–5.0)
Alkaline Phosphatase: 88 U/L (ref 38–126)
Anion gap: 10 (ref 5–15)
BUN: 15 mg/dL (ref 8–23)
CO2: 22 mmol/L (ref 22–32)
Calcium: 8.8 mg/dL — ABNORMAL LOW (ref 8.9–10.3)
Chloride: 103 mmol/L (ref 98–111)
Creatinine, Ser: 1.2 mg/dL — ABNORMAL HIGH (ref 0.44–1.00)
GFR, Estimated: 45 mL/min — ABNORMAL LOW (ref >60)
Glucose, Bld: 109 mg/dL — ABNORMAL HIGH (ref 70–99)
Potassium: 4 mmol/L (ref 3.5–5.1)
Sodium: 135 mmol/L (ref 135–145)
Total Bilirubin: 0.5 mg/dL (ref 0.0–1.2)
Total Protein: 7.2 g/dL (ref 6.5–8.1)

## 2023-04-30 LAB — TROPONIN I (HIGH SENSITIVITY): Troponin I (High Sensitivity): 5 ng/L (ref <18)

## 2023-04-30 MED ORDER — PREDNISONE 10 MG PO TABS: 40.0000 mg | ORAL_TABLET | Freq: Every day | ORAL | 0 refills | Status: AC

## 2023-04-30 MED ORDER — GUAIFENESIN 100 MG/5ML PO LIQD
5.0000 mL | Freq: Once | ORAL | Status: AC
  Administered 2023-04-30: 5 mL via ORAL
  Filled 2023-04-30: qty 5

## 2023-04-30 MED ORDER — IPRATROPIUM-ALBUTEROL 0.5-2.5 (3) MG/3ML IN SOLN
3.0000 mL | Freq: Once | RESPIRATORY_TRACT | Status: AC
  Administered 2023-04-30: 3 mL via RESPIRATORY_TRACT
  Filled 2023-04-30: qty 3

## 2023-04-30 MED ORDER — METHYLPREDNISOLONE SODIUM SUCC 125 MG IJ SOLR
80.0000 mg | Freq: Once | INTRAMUSCULAR | Status: AC
  Administered 2023-04-30: 80 mg via INTRAVENOUS
  Filled 2023-04-30: qty 2

## 2023-04-30 MED ORDER — ALBUTEROL SULFATE HFA 108 (90 BASE) MCG/ACT IN AERS
1.0000 | INHALATION_SPRAY | Freq: Four times a day (QID) | RESPIRATORY_TRACT | 0 refills | Status: AC | PRN
Start: 2023-04-30 — End: ?

## 2023-04-30 MED ORDER — BENZONATATE 100 MG PO CAPS
100.0000 mg | ORAL_CAPSULE | Freq: Three times a day (TID) | ORAL | 0 refills | Status: AC
Start: 2023-04-30 — End: ?

## 2023-04-30 MED ORDER — DOXYCYCLINE HYCLATE 100 MG PO CAPS: 100.0000 mg | ORAL_CAPSULE | Freq: Two times a day (BID) | ORAL | 0 refills | Status: AC

## 2023-04-30 NOTE — ED Notes (Signed)
 Pt ambulated to the BR with little assistance

## 2023-04-30 NOTE — ED Provider Notes (Signed)
 [facility] AT [facility] Provider Note   CSN: [id number] Arrival date & time: 04/30/23  1353     History  Chief Complaint  Patient presents with   Shortness of Breath    Tamara Roberts is a 83 y.o. female.  Patient is an 83 year old female who presents to the emergency department the chief complaint of cough, congestion, shortness of breath which has been ongoing for approximate the past 5 days.  Patient notes that the cough has been productive in nature.  She has had no associated fever or chills.  She denies any associated chest pain.  She denies any abdominal pain, nausea, vomiting, diarrhea.  She denies any dizziness, lightheadedness or syncope.  She denies any lower extremity pain or edema.   Shortness of Breath      Home Medications Prior to Admission medications   Medication Sig Start Date End Date Taking? Authorizing Provider  celecoxib (CELEBREX) 200 MG capsule Take 200 mg by mouth daily. 02/11/19   [provider]  chlorpheniramine-HYDROcodone  (TUSSIONEX PENNKINETIC ER) 10-8 MG/5ML SUER Take 5 mLs by mouth every 12 (twelve) hours as needed for cough. 07/19/20   Charmayne Cooper, MD  cholecalciferol  (VITAMIN D ) 1000 units tablet Take 1,000 Units by mouth daily.    [provider]  doxycycline  (VIBRAMYCIN ) 100 MG capsule Take 1 capsule (100 mg total) by mouth 2 (two) times daily. 07/19/20   Charmayne Cooper, MD  ELIQUIS  5 MG TABS tablet TAKE (1) TABLET BY MOUTH TWICE DAILY. 03/18/20   Gerard Knight, MD  fluticasone  (FLONASE ) 50 MCG/ACT nasal spray Place 1 spray into both nostrils as needed.     [provider]  losartan  (COZAAR ) 50 MG tablet Take 50 mg by mouth daily.    [provider]  metoprolol  succinate (TOPROL -XL) 25 MG 24 hr tablet Take 0.5 tablets (12.5 mg total) by mouth daily. 02/13/19   Gerard Knight, MD  pregabalin  (LYRICA ) 25 MG capsule Take 25 mg by mouth 2 (two) times daily.     [provider]  rosuvastatin  (CRESTOR ) 5 MG tablet Take 5 mg by mouth daily.    [provider]  sertraline (ZOLOFT) 25 MG tablet Take 25 mg by mouth at bedtime. 02/06/19   [provider]  sucralfate  (CARAFATE ) 1 g tablet Take 1 tablet (1 g total) by mouth 4 (four) times daily as needed. 12/24/20   Edson Graces, MD      Allergies    Feldene [piroxicam] and Codeine    Review of Systems   Review of Systems  Respiratory:  Positive for shortness of breath.   All other systems reviewed and are negative.   Physical Exam Updated Vital Signs BP (!) 173/66 (BP Location: Right Arm)   Pulse 76   Temp 97.9 F (36.6 C)   Resp 20   Ht 5' (1.524 m)   Wt 68 kg   SpO2 99%   BMI 29.29 kg/m  Physical Exam Vitals and nursing note reviewed.  Constitutional:      Appearance: Normal appearance.  HENT:     Head: Normocephalic and atraumatic.     Nose: Nose normal.     Mouth/Throat:     Mouth: Mucous membranes are moist.  Eyes:     Extraocular Movements: Extraocular movements intact.     Conjunctiva/sclera: Conjunctivae normal.     Pupils: Pupils are equal, round, and reactive to light.  Cardiovascular:     Rate and Rhythm:  Normal rate and regular rhythm.     Pulses: Normal pulses.     Heart sounds: Normal heart sounds.  Pulmonary:     Effort: Pulmonary effort is normal. No tachypnea or respiratory distress.     Breath sounds: Examination of the right-upper field reveals wheezing. Examination of the left-upper field reveals wheezing. Examination of the right-middle field reveals wheezing. Examination of the right-lower field reveals wheezing. Examination of the left-lower field reveals wheezing. Wheezing and rhonchi present. No rales.  Chest:     Chest wall: No tenderness.  Abdominal:     General: Abdomen is flat. Bowel sounds are normal.     Palpations: Abdomen is soft. There is no mass.     Tenderness: There is no abdominal tenderness. There is no guarding.   Musculoskeletal:        General: Normal range of motion.     Cervical back: Normal range of motion and neck supple.     Right lower leg: No edema.     Left lower leg: No edema.  Skin:    General: Skin is warm and dry.  Neurological:     General: No focal deficit present.     Mental Status: She is alert and oriented to person, place, and time. Mental status is at baseline.  Psychiatric:        Mood and Affect: Mood normal.        Behavior: Behavior normal.        Thought Content: Thought content normal.        Judgment: Judgment normal.     ED Results / Procedures / Treatments   Labs (all labs ordered are listed, but only abnormal results are displayed) Labs Reviewed  BRAIN NATRIURETIC PEPTIDE - Abnormal; Notable for the following components:      Result Value   B Natriuretic Peptide 235.0 (*)    All other components within normal limits  COMPREHENSIVE METABOLIC PANEL WITH GFR - Abnormal; Notable for the following components:   Glucose, Bld 109 (*)    Creatinine, Ser 1.20 (*)    Calcium  8.8 (*)    GFR, Estimated 45 (*)    All other components within normal limits  RESP PANEL BY RT-PCR (RSV, FLU A&B, COVID)  [id number]  CBC WITH DIFFERENTIAL/PLATELET  TROPONIN I (HIGH SENSITIVITY)    EKG None  Radiology DG Chest Portable 1 View Result Date: 04/30/2023 CLINICAL DATA:  Chest congestion and shortness of breath for 2 days. EXAM: PORTABLE CHEST 1 VIEW COMPARISON:  12/23/2020 FINDINGS: Right rotator cuff repair. Numerous leads and wires project over the chest. Midline trachea. Mild cardiomegaly. Atherosclerosis in the transverse aorta. No pleural effusion or pneumothorax. No congestive failure. Clear lungs. IMPRESSION: Cardiomegaly, without acute disease. Aortic Atherosclerosis (ICD10-I70.0). Electronically Signed   By: Lore Rode M.D.   On: 04/30/2023 15:32    Procedures Procedures    Medications Ordered in ED Medications  methylPREDNISolone  sodium succinate (SOLU-MEDROL )  125 mg/2 mL injection 80 mg (has no administration in time range)  guaiFENesin (ROBITUSSIN) 100 MG/5ML liquid 5 mL (has no administration in time range)  ipratropium-albuterol (DUONEB) 0.5-2.5 (3) MG/3ML nebulizer solution 3 mL (3 mLs Nebulization Given 04/30/23 1504)    ED Course/ Medical Decision Making/ A&P Clinical Course as of 04/30/23 1736  Sun Apr 30, 2023  1733 EKG interpretation: Normal sinus rhythm, rate of 71, normal PR/QRS interval, prolonged QTc, no ST/T wave changes, no ischemic changes, no STEMI, normal axis [CR]    Clinical Course User  Index [CR] Roselynn Connors, PA-C                                 Medical Decision Making Amount and/or Complexity of Data Reviewed Labs: ordered. Radiology: ordered.  Risk OTC drugs. Prescription drug management.   This patient presents to the ED for concern of cough, congestion, shortness of breath differential diagnosis includes acute viral syndrome, asthma, COPD, acute CHF, ACS, pneumonia, bronchitis    Additional history obtained:  Additional history obtained from family External records from outside source obtained and reviewed including medical records   Lab Tests:  I Ordered, and personally interpreted labs.  The pertinent results include: No leukocytosis, no anemia, normal electrolytes, creatinine at baseline, normal liver function, negative troponin, negative viral swabs   Imaging Studies ordered:  I ordered imaging studies including chest x-ray I independently visualized and interpreted imaging which showed cardiomegaly with no other acute cardiopulmonary process, no pulmonary edema I agree with the radiologist interpretation   Medicines ordered and prescription drug management:  I ordered medication including DuoNeb, Robitussin, Solu-Medrol  for acute bronchitis Reevaluation of the patient after these medicines showed that the patient improved I have reviewed the patients home medicines and have made  adjustments as needed   Problem List / ED Course:  Patient is feeling much better at this time and is stable for discharge home.  Patient does have stable vital signs with no indication for sepsis and no associated hypoxia.  Wheezing and rhonchi have greatly improved with treatment in the emergency department.  Blood work has been overall unremarkable.  Do not suspect acute CHF at this time as BNP is at her baseline.  Do not SPECT ACS as patient has no acute ischemic changes on EKG and negative troponin.  Patient has no indication for pneumonia at this point and no indication for pneumothorax or hemothorax.  Suspect acute bronchitis at this point.  The need for close follow-up with primary care doctor for reevaluation was discussed as well as strict turn precautions for any new or worsening symptoms.  Patient voiced understanding to the plan and had no additional questions.   Social Determinants of Health:  None           Final Clinical Impression(s) / ED Diagnoses Final diagnoses:  None    Rx / DC Orders ED Discharge Orders     None         Emmalene Hare 04/30/23 1738    Sueellen Emery, MD 05/01/23 [id number]    Sueellen Emery, MD 05/01/23 [phone]

## 2023-04-30 NOTE — Discharge Instructions (Signed)
 Please take all medications as directed.  Follow-up closely with your primary care doctor on an outpatient basis for reevaluation.  Return to emergency department immediately for any new or worsening symptoms.

## 2023-04-30 NOTE — ED Triage Notes (Signed)
 Pt with chest congestion and SOB x 2 days ago. + cough-NP + HA

## 2023-05-03 DIAGNOSIS — J209 Acute bronchitis, unspecified: Secondary | ICD-10-CM | POA: Diagnosis not present

## 2023-05-03 DIAGNOSIS — I480 Paroxysmal atrial fibrillation: Secondary | ICD-10-CM | POA: Diagnosis not present

## 2023-05-03 DIAGNOSIS — Z299 Encounter for prophylactic measures, unspecified: Secondary | ICD-10-CM | POA: Diagnosis not present

## 2023-05-03 DIAGNOSIS — N183 Chronic kidney disease, stage 3 unspecified: Secondary | ICD-10-CM | POA: Diagnosis not present

## 2023-05-03 DIAGNOSIS — M810 Age-related osteoporosis without current pathological fracture: Secondary | ICD-10-CM | POA: Diagnosis not present

## 2023-05-03 DIAGNOSIS — I10 Essential (primary) hypertension: Secondary | ICD-10-CM | POA: Diagnosis not present

## 2023-05-09 ENCOUNTER — Ambulatory Visit
Admission: RE | Admit: 2023-05-09 | Discharge: 2023-05-09 | Disposition: A | Discharge status: Home / Self Care | Source: Ambulatory Visit | Attending: Internal Medicine | Admitting: Internal Medicine

## 2023-05-09 DIAGNOSIS — Z1231 Encounter for screening mammogram for malignant neoplasm of breast: Secondary | ICD-10-CM

## 2023-05-09 DIAGNOSIS — J209 Acute bronchitis, unspecified: Secondary | ICD-10-CM | POA: Diagnosis not present

## 2023-05-09 DIAGNOSIS — Z299 Encounter for prophylactic measures, unspecified: Secondary | ICD-10-CM | POA: Diagnosis not present

## 2023-05-09 DIAGNOSIS — R059 Cough, unspecified: Secondary | ICD-10-CM | POA: Diagnosis not present

## 2023-05-09 DIAGNOSIS — I700 Atherosclerosis of aorta: Secondary | ICD-10-CM | POA: Diagnosis not present

## 2023-05-09 DIAGNOSIS — R52 Pain, unspecified: Secondary | ICD-10-CM | POA: Diagnosis not present

## 2023-05-09 DIAGNOSIS — I10 Essential (primary) hypertension: Secondary | ICD-10-CM | POA: Diagnosis not present

## 2023-05-09 DIAGNOSIS — I739 Peripheral vascular disease, unspecified: Secondary | ICD-10-CM | POA: Diagnosis not present

## 2023-05-25 DIAGNOSIS — M5416 Radiculopathy, lumbar region: Secondary | ICD-10-CM | POA: Diagnosis not present

## 2023-07-10 DIAGNOSIS — M961 Postlaminectomy syndrome, not elsewhere classified: Secondary | ICD-10-CM | POA: Diagnosis not present

## 2023-07-10 DIAGNOSIS — M5416 Radiculopathy, lumbar region: Secondary | ICD-10-CM | POA: Diagnosis not present

## 2023-07-18 DIAGNOSIS — M5416 Radiculopathy, lumbar region: Secondary | ICD-10-CM | POA: Diagnosis not present

## 2023-08-03 DIAGNOSIS — N1832 Chronic kidney disease, stage 3b: Secondary | ICD-10-CM | POA: Diagnosis not present

## 2023-08-03 DIAGNOSIS — I10 Essential (primary) hypertension: Secondary | ICD-10-CM | POA: Diagnosis not present

## 2023-08-03 DIAGNOSIS — I480 Paroxysmal atrial fibrillation: Secondary | ICD-10-CM | POA: Diagnosis not present

## 2023-08-03 DIAGNOSIS — M810 Age-related osteoporosis without current pathological fracture: Secondary | ICD-10-CM | POA: Diagnosis not present

## 2023-08-03 DIAGNOSIS — Z299 Encounter for prophylactic measures, unspecified: Secondary | ICD-10-CM | POA: Diagnosis not present

## 2023-09-13 DIAGNOSIS — E7800 Pure hypercholesterolemia, unspecified: Secondary | ICD-10-CM | POA: Diagnosis not present

## 2023-09-13 DIAGNOSIS — Z299 Encounter for prophylactic measures, unspecified: Secondary | ICD-10-CM | POA: Diagnosis not present

## 2023-09-13 DIAGNOSIS — Z0000 Encounter for general adult medical examination without abnormal findings: Secondary | ICD-10-CM | POA: Diagnosis not present

## 2023-09-13 DIAGNOSIS — Z1389 Encounter for screening for other disorder: Secondary | ICD-10-CM | POA: Diagnosis not present

## 2023-09-13 DIAGNOSIS — Z7189 Other specified counseling: Secondary | ICD-10-CM | POA: Diagnosis not present

## 2023-09-13 DIAGNOSIS — Z79899 Other long term (current) drug therapy: Secondary | ICD-10-CM | POA: Diagnosis not present

## 2023-09-13 DIAGNOSIS — R5383 Other fatigue: Secondary | ICD-10-CM | POA: Diagnosis not present

## 2023-09-13 DIAGNOSIS — I10 Essential (primary) hypertension: Secondary | ICD-10-CM | POA: Diagnosis not present

## 2023-09-18 DIAGNOSIS — Z23 Encounter for immunization: Secondary | ICD-10-CM | POA: Diagnosis not present

## 2023-09-18 DIAGNOSIS — E559 Vitamin D deficiency, unspecified: Secondary | ICD-10-CM | POA: Diagnosis not present

## 2023-09-18 DIAGNOSIS — Z79899 Other long term (current) drug therapy: Secondary | ICD-10-CM | POA: Diagnosis not present

## 2023-09-18 DIAGNOSIS — E7800 Pure hypercholesterolemia, unspecified: Secondary | ICD-10-CM | POA: Diagnosis not present
